# Patient Record
Sex: Male | Born: 1955
Health system: Southern US, Community
[De-identification: ages and names within clinical notes are randomized; demographics above are authoritative.]

## PROBLEM LIST (undated history)

## (undated) DIAGNOSIS — N200 Calculus of kidney: Secondary | ICD-10-CM

## (undated) DIAGNOSIS — K635 Polyp of colon: Secondary | ICD-10-CM

## (undated) DIAGNOSIS — R569 Unspecified convulsions: Secondary | ICD-10-CM

## (undated) DIAGNOSIS — F419 Anxiety disorder, unspecified: Secondary | ICD-10-CM

## (undated) DIAGNOSIS — F329 Major depressive disorder, single episode, unspecified: Secondary | ICD-10-CM

## (undated) DIAGNOSIS — J449 Chronic obstructive pulmonary disease, unspecified: Secondary | ICD-10-CM

## (undated) DIAGNOSIS — I10 Essential (primary) hypertension: Secondary | ICD-10-CM

## (undated) DIAGNOSIS — I639 Cerebral infarction, unspecified: Secondary | ICD-10-CM

## (undated) DIAGNOSIS — G4730 Sleep apnea, unspecified: Secondary | ICD-10-CM

## (undated) DIAGNOSIS — S82891A Other fracture of right lower leg, initial encounter for closed fracture: Secondary | ICD-10-CM

## (undated) DIAGNOSIS — F32A Depression, unspecified: Secondary | ICD-10-CM

## (undated) DIAGNOSIS — Z86718 Personal history of other venous thrombosis and embolism: Secondary | ICD-10-CM

## (undated) DIAGNOSIS — M1990 Unspecified osteoarthritis, unspecified site: Secondary | ICD-10-CM

## (undated) HISTORY — DX: Polyp of colon: K63.5

## (undated) HISTORY — DX: Calculus of kidney: N20.0

## (undated) HISTORY — DX: Chronic obstructive pulmonary disease, unspecified: J44.9

## (undated) HISTORY — DX: Other fracture of right lower leg, initial encounter for closed fracture: S82.891A

---

## 2003-04-17 DIAGNOSIS — I639 Cerebral infarction, unspecified: Secondary | ICD-10-CM

## 2003-04-17 HISTORY — PX: OTHER SURGICAL HISTORY: SHX169

## 2003-04-17 HISTORY — DX: Cerebral infarction, unspecified: I63.9

## 2003-11-12 ENCOUNTER — Inpatient Hospital Stay (HOSPITAL_COMMUNITY): Admission: EM | Admit: 2003-11-12 | Discharge: 2003-11-15 | Payer: Self-pay | Admitting: Emergency Medicine

## 2004-02-02 ENCOUNTER — Emergency Department (HOSPITAL_COMMUNITY): Admission: EM | Admit: 2004-02-02 | Discharge: 2004-02-03 | Payer: Self-pay | Admitting: Emergency Medicine

## 2004-06-08 ENCOUNTER — Ambulatory Visit: Payer: Self-pay | Admitting: Internal Medicine

## 2004-06-08 ENCOUNTER — Ambulatory Visit (HOSPITAL_COMMUNITY): Admission: RE | Admit: 2004-06-08 | Discharge: 2004-06-08 | Payer: Self-pay | Admitting: Internal Medicine

## 2004-06-08 HISTORY — PX: COLONOSCOPY: SHX174

## 2004-12-28 ENCOUNTER — Ambulatory Visit: Payer: Self-pay | Admitting: Neurology

## 2005-04-18 ENCOUNTER — Emergency Department (HOSPITAL_COMMUNITY): Admission: EM | Admit: 2005-04-18 | Discharge: 2005-04-18 | Payer: Self-pay | Admitting: Emergency Medicine

## 2005-05-10 ENCOUNTER — Emergency Department (HOSPITAL_COMMUNITY): Admission: EM | Admit: 2005-05-10 | Discharge: 2005-05-10 | Payer: Self-pay | Admitting: Emergency Medicine

## 2007-09-02 ENCOUNTER — Emergency Department (HOSPITAL_COMMUNITY): Admission: EM | Admit: 2007-09-02 | Discharge: 2007-09-02 | Payer: Self-pay | Admitting: Emergency Medicine

## 2009-05-25 ENCOUNTER — Ambulatory Visit: Payer: Self-pay | Admitting: Gastroenterology

## 2009-05-25 DIAGNOSIS — I6350 Cerebral infarction due to unspecified occlusion or stenosis of unspecified cerebral artery: Secondary | ICD-10-CM | POA: Insufficient documentation

## 2009-05-25 DIAGNOSIS — R197 Diarrhea, unspecified: Secondary | ICD-10-CM | POA: Insufficient documentation

## 2009-05-25 DIAGNOSIS — R569 Unspecified convulsions: Secondary | ICD-10-CM | POA: Insufficient documentation

## 2009-05-25 DIAGNOSIS — L408 Other psoriasis: Secondary | ICD-10-CM | POA: Insufficient documentation

## 2009-05-25 DIAGNOSIS — Z860100 Personal history of colon polyps, unspecified: Secondary | ICD-10-CM | POA: Insufficient documentation

## 2009-05-25 DIAGNOSIS — F3289 Other specified depressive episodes: Secondary | ICD-10-CM | POA: Insufficient documentation

## 2009-05-25 DIAGNOSIS — F329 Major depressive disorder, single episode, unspecified: Secondary | ICD-10-CM | POA: Insufficient documentation

## 2009-05-25 DIAGNOSIS — I10 Essential (primary) hypertension: Secondary | ICD-10-CM | POA: Insufficient documentation

## 2009-06-16 ENCOUNTER — Ambulatory Visit: Payer: Self-pay | Admitting: Gastroenterology

## 2009-06-16 ENCOUNTER — Ambulatory Visit (HOSPITAL_COMMUNITY): Admission: RE | Admit: 2009-06-16 | Discharge: 2009-06-16 | Payer: Self-pay | Admitting: Gastroenterology

## 2009-06-16 HISTORY — PX: COLONOSCOPY: SHX174

## 2009-06-16 HISTORY — PX: ESOPHAGOGASTRODUODENOSCOPY: SHX1529

## 2009-11-18 ENCOUNTER — Emergency Department (HOSPITAL_COMMUNITY): Admission: EM | Admit: 2009-11-18 | Discharge: 2009-11-18 | Payer: Self-pay | Admitting: Emergency Medicine

## 2010-05-18 NOTE — Letter (Signed)
Summary: TCS ORDER  TCS ORDER   Imported By: Ave Filter 05/25/2009 11:03:09  _____________________________________________________________________  External Attachment:    Type:   Image     Comment:   External Document

## 2010-05-18 NOTE — Assessment & Plan Note (Signed)
Summary: CHANGE IN BOWEL HABITS.GU   Visit Type:  Initial Consult Referring Provider:  Dr Lewis Moccasin Primary Care Provider:  Dr Lewis Moccasin  Chief Complaint:  change in bm- diarrhea since christmas.  History of Present Illness: 55 y/o caucasian male w/ 2 month hx of daily chronic dark diarrhea.  Diarrhea in small amts q 2-3 hrs, 5-10 stools/day.  Denies rectal bleeding or melena.  Denies any abd pain.  notes mucous in stool.  Previous c diff, e coli, culture & o/p negative at PCP.  Lost 10# in 2 mo. Denies foreign travel, no new pets, ill contacts, or new meds.  No recent abx.  The patient has associated symptoms of weight loss and dark urine.  The patient denies nausea, vomiting, fever, anxiety, depression, headaches, dry skin, and dry mouth and tongue.  Treatments tried in the past that have been ineffective includes imodium.   Current Problems (verified): 1)  Depression  (ICD-311) 2)  Psoriasis  (ICD-696.1) 3)  Seizure Disorder  (ICD-780.39) 4)  Cva  (ICD-434.91) 5)  Colonic Polyps, Adenomatous, Hx of  (ICD-V12.72) 6)  Hypertension  (ICD-401.9) 7)  Diarrhea  (ICD-787.91) 8)  Fh of Colon Cancer  (ICD-153.9)  Current Medications (verified): 1)  Keppra 500 Mg Tabs (Levetiracetam) .... Four Times A Day Prn 2)  Xanax 1 Mg Tabs (Alprazolam) .... At Bedtime 3)  Flonase .... Once Daily 4)  Abilify 5 Mg Tabs (Aripiprazole) .... 1/2 By Mouth in Am & 1 By Mouth Qhs 5)  Prozac 40 Mg Caps (Fluoxetine Hcl) .... One By Mouth Daily  Allergies (verified): No Known Drug Allergies  Past History:  Past Medical History: COLONIC POLYPS, ADENOMATOUS, HX OF (ICD-V12.72) 3 colonoscopies w/ polyps, last Dr Karilyn Cota 05/2004  SEIZURE DISORDER (ICD-780.39) CVA (ICD-434.91) HYPERTENSION (ICD-401.9) DIARRHEA (ICD-787.91) Psoriasis depression  Past Surgical History: left knee arthrosopy  Family History: Father:(deceased 71) metastatic Ca unknown etiology Mother:(deceased 32) COLON CA dx 66's, CAD Siblings:  1 brother deceased age 6-MI 1 daughter MS  Social History: 3 divorces, single, lives w/son 4 grown children disabled, previously self-employed floor coverings Patient currently smokes. 30 pkyr hx Patient has been counseled to quit.  Alcohol Use - none currently, quit 1 month ago, denies hx heavy use Daily Caffeine Use Illicit Drug Use - no Patient does not get regular exercise.  Smoking Status:  current Drug Use:  no Does Patient Exercise:  no  Review of Systems General:  Complains of sleep disorder; denies fever, chills, sweats, anorexia, fatigue, weakness, and malaise. CV:  Denies chest pains, angina, palpitations, syncope, dyspnea on exertion, orthopnea, PND, peripheral edema, and claudication. Resp:  Denies dyspnea at rest, dyspnea with exercise, cough, sputum, wheezing, coughing up blood, and pleurisy. GI:  Denies difficulty swallowing, pain on swallowing, indigestion/heartburn, vomiting, vomiting blood, jaundice, constipation, and fecal incontinence. GU:  Denies urinary burning, blood in urine, urinary frequency, urinary hesitancy, nocturnal urination, and urinary incontinence. Derm:  Complains of rash and dry skin; denies itching, hives, moles, warts, and unhealing ulcers; psoriasis. Psych:  Denies depression, anxiety, memory loss, suicidal ideation, hallucinations, paranoia, phobia, and confusion. Heme:  Denies bruising, bleeding, and enlarged lymph nodes.  Vital Signs:  Patient profile:   55 year old male Height:      70 inches Weight:      163 pounds BMI:     23.47 Temp:     97.9 degrees F oral Pulse rate:   60 / minute BP sitting:   130 / 92  (left arm) Cuff size:  regular  Vitals Entered By: Hendricks Limes LPN (May 25, 2009 9:32 AM)  Physical Exam  General:  Well developed, well nourished, no acute distress. Head:  Normocephalic and atraumatic. Eyes:  Sclera clear, no icterus. Ears:  Normal auditory acuity. Nose:  No deformity, discharge,  or  lesions. Mouth:  No deformity or lesions, dentition normal. Neck:  Supple; no masses or thyromegaly. Chest Wall:  Symmetrical,  no deformities . Lungs:  Clear throughout to auscultation. Heart:  Regular rate and rhythm; no murmurs, rubs,  or bruits. Abdomen:  Soft, nontender and nondistended. No masses, hepatosplenomegaly or hernias noted. Normal bowel sounds.without guarding and without rebound.   Rectal:  deferred until time of colonoscopy.   Msk:  Symmetrical with no gross deformities. Normal posture. Pulses:  Normal pulses noted. Extremities:  No clubbing, cyanosis, edema or deformities noted. Neurologic:  Alert and  oriented x4;  grossly normal neurologically. Skin:  Intact without significant lesions or rashes. Cervical Nodes:  No significant cervical adenopathy. Psych:  Alert and cooperative. Normal mood and affect.  Impression & Recommendations:  Problem # 1:  DIARRHEA (ICD-54.91) 55 y/o caucasian male w/ 2 month hx chronic daily non-bloody diarrhea.  Personal hx adenomatous and family hx colon CA in mother.  Previous stool studies negative.  He has had weight loss.  Etiology of diarrhea unknown.  Differentials are very wide and include colorectal CA, post-infectious IBS, microscopic colitis, IBD, & bacterial origin less likely given negative stool studies.  Other less likely etiology include thyroid disorder, lymphoma, or least likely VIP-oma.  Diagnostic colonoscopy to be performed by Dr. Jonette Eva in the near future in OR given polypharmacy.  This I have discussed risks and benefits which include, but are not limited to, bleeding, infection, perforation, or medication reaction.  The patient agrees with this plan and consent will be obtained.  Orders: Consultation Level III (16109)  Problem # 2:  COLONIC POLYPS, ADENOMATOUS, HX OF (ICD-V12.72) On 3 previos colonoscopies.  Problem # 3:  Family Hx of COLON CANCER (ICD-153.9) See #1  Appended Document: CHANGE IN BOWEL  HABITS.GU Needs to be TCS/?EGD in OR 1.5 hr slot.  Appended Document: CHANGE IN BOWEL HABITS.GU EGD added to procedure and pt is in an 1.5 hr slot.

## 2010-06-30 LAB — URINALYSIS, ROUTINE W REFLEX MICROSCOPIC
Glucose, UA: NEGATIVE mg/dL
Leukocytes, UA: NEGATIVE
Nitrite: NEGATIVE
Protein, ur: 100 mg/dL — AB
Specific Gravity, Urine: >1.03 — ABNORMAL HIGH (ref 1.005–1.030)
Urobilinogen, UA: 1 mg/dL (ref 0.0–1.0)
pH: 6.5 (ref 5.0–8.0)

## 2010-06-30 LAB — URINE MICROSCOPIC-ADD ON

## 2010-07-05 LAB — HEMOGLOBIN AND HEMATOCRIT, BLOOD
HCT: 43.5 % (ref 39.0–52.0)
Hemoglobin: 15.1 g/dL (ref 13.0–17.0)

## 2010-07-05 LAB — BASIC METABOLIC PANEL
BUN: 7 mg/dL (ref 6–23)
CO2: 25 mEq/L (ref 19–32)
Calcium: 8.8 mg/dL (ref 8.4–10.5)
Chloride: 107 mEq/L (ref 96–112)
Creatinine, Ser: 0.92 mg/dL (ref 0.4–1.5)
GFR calc Af Amer: >60 mL/min (ref >60)
GFR calc non Af Amer: >60 mL/min (ref >60)
Glucose, Bld: 88 mg/dL (ref 70–99)
Potassium: 4.2 mEq/L (ref 3.5–5.1)
Sodium: 140 mEq/L (ref 135–145)

## 2010-09-01 NOTE — Procedures (Signed)
NAMESVEN, PINHEIRO                        ACCOUNT NO.:  [id number]   MEDICAL RECORD NO.:  [id number]                   PATIENT TYPE:  INP   LOCATION:  A211                                 FACILITY:  [facility]   PHYSICIAN:  Dani Gobble, MD                    DATE OF BIRTH:  February 04, 1956   DATE OF PROCEDURE:  11/15/2003  DATE OF DISCHARGE:                                  ECHOCARDIOGRAM   REFERRING PHYSICIAN:  1. Dr. Orvan Falconer.  2. Dr. Laural Benes in [place].   INDICATIONS FOR PROCEDURE:  Mr. Orsak is a 55 year old gentleman with  dizziness and seizures who is referred for echocardiogram to evaluate LV  function.   The technical quality of the study is adequate.   The aorta is within normal limits at 3.4 cm.   The left atrium is also within normal limits at 3.9 cm.  No obvious clots  are appreciated and the patient appeared to be in sinus rhythm during this  procedure.   The interventricular septum and posterior wall are within normal limits and  thickness at 1.1 cm and 0.9 cm, respectively.   The aortic valve is thin, trileaflet, and pliable with normal leaflet  excursion.  No aortic insufficiency is noted.  Doppler interrogation of the  aortic valve is within normal limits.   Mitral valve also appears structurally normal with normal leaflet excursion.  There is a small mobile echodensity just below the mitral valve which is  most consistent with Lambl's excrescence versus a torn minor subvalvular  apparatus, although the possibility of a small vegetation cannot be entirely  excluded.  Trivial mitral regurgitation was noted.  Doppler interrogation of  the mitral valve was within normal limits.   The pulmonic valve appeared grossly structurally normal.   The tricuspid valve also appeared grossly structurally normal with mild  tricuspid regurgitation noted.   The left ventricle was normal in size with the LVEDD measured at 4.9 cm, and  the LVESD measured at 3.5 cm.  Overall  left ventricular systolic function  was normal and no regional wall motion abnormalities were appreciated.  The  right atrium and right ventricle were normal in size.  The right ventricular  systolic function was normal.  No pericardial effusion was appreciated.   IMPRESSION:  1. Normal left ventricular size and systolic function without regional wall     motion abnormalities noted.  2. Trivial mitral and mild tricuspid regurgitation.  3. Small mobile echodensity is noted just below the mitral valve which is     most consistent with a Lambl's excrescence versus a torn minor     subvalvular apparatus, however, the possibility of small vegetation     cannot be entirely excluded.      ___________________________________________  Dani Gobble, MD   AB/MEDQ  D:  11/15/2003  T:  11/15/2003  Job:  [id number]   cc:   Dr. Orvan Falconer   Dr. Philis Nettle, [place]

## 2010-09-01 NOTE — Procedures (Signed)
NAME:  Adam Wheeler, Adam Wheeler                        ACCOUNT NO.:  [id number]   MEDICAL RECORD NO.:  [id number]                   PATIENT TYPE:  INP   LOCATION:  A211                                 FACILITY:  [facility]   PHYSICIAN:  Kofi A. Gerilyn Pilgrim, M.D.              DATE OF BIRTH:  04-30-1955   DATE OF PROCEDURE:  DATE OF DISCHARGE:  11/15/2003                                EEG INTERPRETATION   CONTINUATION:   IMPRESSION:  This is a normal recording of the awake and drowsy states.  However, a single EEG does not rule out epileptic seizures.  If clinically  indicated, a sleep-deprived recording may be useful.      ___________________________________________                                            Darleen Crocker A. Gerilyn Pilgrim, M.D.   KAD/MEDQ  D:  11/17/2003  T:  11/17/2003  Job:  [id number]

## 2010-09-01 NOTE — Op Note (Signed)
NAMEMYKING, SAR              ACCOUNT NO.:  [id number]   MEDICAL RECORD NO.:  [id number]          PATIENT TYPE:  AMB   LOCATION:  DAY                           FACILITY:  [facility]   PHYSICIAN:  Lionel December, M.D.    DATE OF BIRTH:  03/13/1956   DATE OF PROCEDURE:  06/08/2004  DATE OF DISCHARGE:                                 OPERATIVE REPORT   PROCEDURE:  Total colonoscopy with polypectomy.   INDICATIONS:  Adam Wheeler is a 55 year old Caucasian male with history of colonic  polyps. Family history is positive for colon carcinoma in two maternal  uncles and he recently loss a first cousin because of colon carcinoma.  Family history is positive for other cancers, but no first degree relative  has had colon cancer. He had colonic polyps removed about 8 or 10 years ago.  His last colonoscopy in 2001 was normal, though. He has recently had  unexplained weight loss.   Procedure risks were reviewed the patient, and informed consent was  obtained.   PREMEDICATION:  Demerol 75 mg IV, Versed 17 mg IV.   FINDINGS:  Procedure performed in endoscopy suite. The patient's vital signs  and O2 sat were monitored during procedure and remained stable. Despite  fairly high dose of conscious sedation, he was still constantly talking  during the procedure but was very comfortable. The patient was placed in  left lateral position and rectal examination performed. No abnormality noted  on external or digital exam. He had tiny sentinel skin tags. He had  extensive scaly rash to his sacral area secondary to psoriasis. Digital exam  was normal. Olympus video scope was placed in the rectum and advanced under  vision into sigmoid colon beyond. Preparation was satisfactory. Scope was  passed into cecum which was identified by appendiceal orifice and ileocecal  valve. Short segment of TI was also examined and was normal. As the scope  was withdrawn, colonic mucosa was carefully examined. There was a single  polyp  at mid transverse colon about 4 mm in diameter. This was cold snared  and retrieved for histological examination. Mucosa rest of the colon and  rectum was normal. Scope was retroflexed to examine anorectal junction, and  small hemorrhoids were noted below the dentate line. Endoscope was then  withdrawn. The patient tolerated the procedure well.   FINAL DIAGNOSES:  1.  Small polyp cold snared from transverse colon.  2.  Small external hemorrhoids.   RECOMMENDATIONS:  1.  Standard instructions given. He can resume his ASA today.  2.  I will be contacting the patient with biopsy results and further      recommendations.      NR/MEDQ  D:  06/08/2004  T:  06/08/2004  Job:  [id number]   cc:   Bethena Midget, M.D.  [place], [place]

## 2010-09-01 NOTE — Procedures (Signed)
NAME:  Adam Wheeler, Adam Wheeler                        ACCOUNT NO.:  [id number]   MEDICAL RECORD NO.:  [id number]                   PATIENT TYPE:  INP   LOCATION:  A211                                 FACILITY:  [facility]   PHYSICIAN:  Kofi A. Gerilyn Pilgrim, M.D.              DATE OF BIRTH:  1955/10/02   DATE OF PROCEDURE:  DATE OF DISCHARGE:  11/15/2003                                EEG INTERPRETATION   This is a 55 year old gentleman who has seizures.   ANALYSIS:  A 16-channel recording is conducted for approximately 40 minutes  using a posterior rhythm of 10 Hz, which attenuates with eye opening.  There  is beta activity seen in the frontal area.  Awake and drowsy activity are  noted.  There are brief states of sleep noted with some spindles and K  complexes.  Foot stimulation and hyperventilation do not elicit any abnormal  responses.  There is no focal slowing, lateralized slowing or epileptiform  activity noted.   IMPRESSION:  This is a normal recording of awake and drowsy states.   Dictation ended at this point.      ___________________________________________                                            Perlie Gold. Gerilyn Pilgrim, M.D.   KAD/MEDQ  D:  11/17/2003  T:  11/17/2003  Job:  [id number]

## 2010-09-01 NOTE — Discharge Summary (Signed)
NAME:  Adam Wheeler, Adam Wheeler                        ACCOUNT NO.:  [id number]   MEDICAL RECORD NO.:  [id number]                   PATIENT TYPE:  INP   LOCATION:  A211                                 FACILITY:  [facility]   PHYSICIAN:  Vania Rea, M.D.              DATE OF BIRTH:  27-Nov-1955   DATE OF ADMISSION:  11/12/2003  DATE OF DISCHARGE:  11/15/2003                                 DISCHARGE SUMMARY   PRIMARY CARE PHYSICIAN:  Dr. Laural Benes from [facility].   DISCHARGE DIAGNOSES:  1. New onset seizures.  2. Right hemiplegia, resolved.  3. Labyrinthitis.  4. Active psoriasis.  5. Tobacco abuse.  6. History of depression.  7. History of gastroesophageal reflux disease.  8. History of left knee surgery five weeks ago.   DISPOSITION:  Discharged to home.   DISCHARGE CONDITION:  Stable.   DISCHARGE MEDICATIONS:  1. Dilantin 200 mg daily.  2. Augmentin 875 mg twice daily for one week.  3. Zyrtec 10 mg daily.  4. Meclizine 25 mg three times a day for one week.  5. Aspirin 325 mg daily.  6. Prevacid 30 mg daily.  7. Nicotine patch 40 mg per day.  8. Ambien 10 mg at bedtime when necessary.   HOSPITAL COURSE:  Please refer to History and Physical of July 29th.  This  is a 55 year old Caucasian man, started on Wellbutrin about five weeks ago.  Also, has a history of depression, tobacco abuse, GERD, psoriasis, status  post left knee surgery, also five weeks ago, presented to the emergency room  after having a witnessed generalized seizure while sitting in his car.  Postictal phase was described and in the emergency room, the patient was  noted to have mild confusion, but not sufficient to prevent taking a clear  history.  The patient was also found to have a mild right hemiplegia with  power grade 5 on the right, although the patient is right-handed.  The  patient was loaded with Dilantin and admitted for investigation over this  weekend.  The patient had an MRI and MRA  of the brain which was completely  normal with no evidence of tumor, ischemic deficits or vascular  abnormalities.   By the following day, the patients right hemiplegia had almost resolved, but  the patient did not seem quite himself.  The patient also started at that  time to complain of vertigo with extension of his neck.  The patient  described even with his eyes closed, the room started to spin when he  extended his neck right back.  Carotid sinus massage during this  hospitalization caused no change in his pulse and no abnormalities were  noted on the telemetry monitor.   Today, the patient had completed an EEG, 2D echo and Dopplers of both  carotids.  The results of these three studies are not yet available, but  since the patient is normal on  physical exam, he has no known neurologic  deficit, he has no carotid bruit and never did.  His heart rhythm has been  in normal sinus rhythm throughout his hospital stay, without any cardiac  murmurs.  His D-dimer was negative, therefore, we felt there was no  indication for pursuing the diagnosis of pulmonary embolism.  He was never  hypoxic and was never tachycardic.  The patient is discharged home, to be  followed by Kofi A. Gerilyn Pilgrim, M.D., Neurologist, and his primary care  physician.  Due to personal emergencies, Dr. Gerilyn Pilgrim is not able to see the  patient on this admission.   We note that the patient has been on Wellbutrin for five weeks, but we are  unable to say definitively if this could be the cause of his seizures.   SPECIAL INSTRUCTIONS:  The patient is advised not to drive until cleared to  do so by the neurologist and if that time does not come, then he should not  drive for one year.  The patient is advised that he will probably need to  substitute another drug for Wellbutrin.  He actually got one dose of Lexapro  when here, but he prefers to have his primary care physician select a drug  for him.  The patient has been  continued on a nicotine patch for his tobacco  abuse as he would like to stop.  The patient has been advised of the side  effects of Dilantin to cause gum hypertrophy when oral hygiene is not  optimal.     ___________________________________________                                         Vania Rea, M.D.   LC/MEDQ  D:  11/15/2003  T:  11/15/2003  Job:  [id number]   cc:   Kofi A. Gerilyn Pilgrim, M.D.  [id number] [place] [place]  [place]  [place] [address]  Fax: [phone]   Dr. [facility]

## 2010-09-01 NOTE — H&P (Signed)
NAME:  Adam [place], Adam Wheeler                        ACCOUNT NO.:  [id number]   MEDICAL RECORD NO.:  [id number]                   PATIENT TYPE:  EMS   LOCATION:  ED                                   FACILITY:  [facility]   PHYSICIAN:  Vania Rea, M.D.              DATE OF BIRTH:  28-Dec-1955   DATE OF ADMISSION:  11/12/2003  DATE OF DISCHARGE:                                HISTORY & PHYSICAL   PRIMARY CARE PHYSICIAN:  Dr. Laural Benes from [facility].   CHIEF COMPLAINT:  New-onset seizures.   HISTORY OF PRESENT ILLNESS:  This is a 55 year old Caucasian man with a  history of GERD, tobacco abuse, psoriasis, depression, started on Wellbutrin  about 5 weeks ago, status post left knee surgery about 5 weeks ago.  Has  been under a lot of stress lately and has not been sleeping well.  Has been  having headaches on and off.  His brother is sick and has leukemia, his  mother is not well, he is moving house, but medically was otherwise okay  until sitting in a car at [facility] this morning, was  about to drive off, when apparently suddenly started shaking and lost  awareness.  The patient has no memory of the incident, last remembers  sitting in the car, but occupants of the car apparently recognized him as  having seizures and drove the car up to the emergency response depot which  was nearby.  The patient has a vague awareness of being in an ambulance and  was brought to the emergency room.   A CT scan of the head showed no acute abnormalities and his D-dimer is  normal.   The patient is currently having headaches but has no other problems.  Smokes  about a half pack per day and has a chronic cough, has not been told that he  has wheezing or bronchitis.  Says his exercise tolerance is normal but does  not do much climbing on the stairs because of his knee.  Has no previous  history of syncope or strokes.  Has never seen a psychiatrist.   PAST MEDICAL HISTORY:  1. Status post left knee surgery 5 weeks ago because of injury to his knee     from his occupation involving a lot of kneeling.  2. Hiatal hernia with GERD.  3. Psoriasis.  4. Depression.  5. Chronic tobacco abuse.   MEDICATIONS:  1. Prevacid 30 mg daily.  2. Wellbutrin XL unknown dose daily.   ALLERGIES:  No known drug allergies.   SOCIAL HISTORY:  He is divorced about one-and-a-half years now after 12  years in his third marriage.  Has four children.  Smokes for the past 30  years, used to be one pack per day until the last 6 months it is about a  half pack per day.  He is trying to cut down and that is one  of the reasons  he is taking Wellbutrin.  Very occasional alcohol use.  Denies illicit drug  use but he was questioned in front of his family.  For the past 6 months he  has been working as an Scientific laboratory technician - [employer] - but because  of problems with his knee he has discontinued that.  He is also a musician  in a band, a Proofreader.   FAMILY HISTORY:  Significant for a mother who is still alive at age 55 with  hypertension and heart disease, a father who is deceased at age 84 from  stomach cancer 6 years ago.  His only brother has leukemia and, in fact, is  being hospitalized today for what sounds like I&D of an abscess by Dr.  Malvin Johns.  He has four children - the three boys are in good health, the one  girl is with multiple sclerosis.   REVIEW OF SYSTEMS:  Headache as outlined above.  No eye problems or thyroid  problems. He has a chronic smokers cough.  He has had insomnia since about  Wednesday and that is for the past 3 days.  Denies any fever, cough, or  cold.  Denies chest pains or shortness of breath.  He does have epigastric  pains associated with his GERD and has to sleep upright, sometimes wakes up  with epigastric pains which he presumes is due to GERD.  Denies nausea,  vomiting, diarrhea, constipation, or weight changes.  Denies any  genitourinary  problems.  Has joint problems with both knees status post left  knee surgery.  Scheduled to have right knee surgery on August 9.  He says it  is an orthopedic group in [place].  Has never had any central nervous  system problems before.   PHYSICAL EXAMINATION:  GENERAL:  This is a healthy-looking, middle-aged  Caucasian man lying in bed in no obvious distress although he seems to be  very hungry.  VITAL SIGNS:  His temperature is 97.3, blood pressure 154/91, pulse 72,  saturating at 98% on room air.  HEENT:  His pupils are round, equal, and reactive to light.  He is pink and  anicteric, mildly dehydrated.  CHEST:  He has diffuse rhonchi throughout his chest, no clear crackles.  CARDIOVASCULAR:  He has a regular rhythm, no carotid bruit.  ABDOMEN:  Soft and nontender.  EXTREMITIES:  Without edema.  There are no joint deformities.  SKIN:  He has extensive psoriasis over both knees, extends the surface of  his hands.  He has nail changes in all 10 upper extremity digits compatible  with psoriasis.  He also has psoriasis of the scalp.  NEUROLOGIC:  Cranial nerves II-X seem to be grossly intact.  Sensation seems  to be grossly intact.  Deep tendon reflexes seem equal and normal  throughout.  Left knee jerk was not tested because of his recent surgery.  However, power was grade 4 on the right upper and lower extremity and grade  5 on the left.  The patient is right-handed.  His gait is impaired due to  right-sided weakness.   LABORATORY DATA:  His CBC is entirely normal with a white count of 6.8 and a  hematocrit of 38.8.  His chemistry is essentially normal:  Sodium 139,  potassium 3.9, chloride 107, CO2 25, glucose 101, BUN 11, creatinine 1.1,  calcium 8.7, total protein 6.1, albumin 3.7, AST 18, ALT 18, alk phos 49,  total bilirubin 0.6.  His D-dimer is  normal at 0.25.  His point of care enzymes are also normal.  CT scan of the head shows no acute abnormality.  Chest x-ray shows  hyperinflation and COPD.  EKG shows normal sinus rhythm.   1. New-onset seizure/syncope with a right hemiplegia and a normal CT scan.  2. Chronic obstructive pulmonary disease and bronchiospastic disease on     clinical examination in a chronic smoker.  3. History of gastroesophageal reflux disease.  4. History of depression.  5. Status post left knee surgery.   PLAN:  Plan for his seizures:  We cannot assume that it is simply a result  of the Wellbutrin because he does have the right hemiplegia.  We will go  ahead and get an MRI/MRA of the brain to rule out some type of tumor or some  type of neurologic deficit.  Could also be a TIA if we find that the deficit  recovers and the MRI is normal.  Could conceivably be due to hypoxia.  He  seems to have significant lung disease on examination and chest x-ray but he  is currently saturating okay on room air.  Again, he does have a right  hemiplegia. His GERD seems to be quite significant by history and we will  give him double coverage of Protonix and Reglan since we will be starting  him on aspirin since he has no signs of bleed and he does have a neurologic  deficit.  His blood pressure seems to be somewhat elevated and we will keep  a check on it for the next 24 hours before considering starting any new  medications.  We will load him with Dilantin since he has a neurologic  deficit and then we will continue with Dilantin pending the results of the  MRI/MRA.  He will get as complete a neurological evaluation as we can  pending his neurology consult.  We will withhold the Wellbutrin and start  him on a nicotine patch for his tobacco abuse.   If his hemiplegia resolves and his MRI/MRA is completely normal, this  patient may possibly discharged home to be followed up by Dr. Gerilyn Pilgrim as an  outpatient.  The patient is very, very reluctant to be admitted but we feel  it is the best thing for him.      ___________________________________________                                         Vania Rea, M.D.   LC/MEDQ  D:  11/12/2003  T:  11/12/2003  Job:  [id number]

## 2012-02-17 ENCOUNTER — Emergency Department (HOSPITAL_COMMUNITY)
Admission: EM | Admit: 2012-02-17 | Discharge: 2012-02-17 | Disposition: A | Discharge status: Home / Self Care | Payer: Medicare Other | Attending: Emergency Medicine | Admitting: Emergency Medicine

## 2012-02-17 ENCOUNTER — Emergency Department (HOSPITAL_COMMUNITY): Payer: Medicare Other

## 2012-02-17 ENCOUNTER — Encounter (HOSPITAL_COMMUNITY): Payer: Self-pay | Admitting: Emergency Medicine

## 2012-02-17 DIAGNOSIS — S59909A Unspecified injury of unspecified elbow, initial encounter: Secondary | ICD-10-CM | POA: Insufficient documentation

## 2012-02-17 DIAGNOSIS — M25539 Pain in unspecified wrist: Secondary | ICD-10-CM

## 2012-02-17 DIAGNOSIS — Y939 Activity, unspecified: Secondary | ICD-10-CM | POA: Insufficient documentation

## 2012-02-17 DIAGNOSIS — Y929 Unspecified place or not applicable: Secondary | ICD-10-CM | POA: Insufficient documentation

## 2012-02-17 DIAGNOSIS — W01119A Fall on same level from slipping, tripping and stumbling with subsequent striking against unspecified sharp object, initial encounter: Secondary | ICD-10-CM | POA: Insufficient documentation

## 2012-02-17 DIAGNOSIS — S6990XA Unspecified injury of unspecified wrist, hand and finger(s), initial encounter: Secondary | ICD-10-CM | POA: Insufficient documentation

## 2012-02-17 MED ORDER — OXYCODONE-ACETAMINOPHEN 5-325 MG PO TABS
1.0000 | ORAL_TABLET | Freq: Once | ORAL | Status: AC
  Administered 2012-02-17: 1 via ORAL
  Filled 2012-02-17: qty 1

## 2012-02-17 MED ORDER — OXYCODONE-ACETAMINOPHEN 5-325 MG PO TABS: 1.0000 | ORAL_TABLET | ORAL | Status: AC | PRN

## 2012-02-17 MED ORDER — NAPROXEN 500 MG PO TABS: 500.0000 mg | ORAL_TABLET | Freq: Two times a day (BID) | ORAL | Status: DC

## 2012-02-17 NOTE — ED Provider Notes (Signed)
History     CSN: [id number]  Arrival date & time 02/17/12  1122   First MD Initiated Contact with Patient 02/17/12 1152      Chief Complaint  Patient presents with  . Wrist Injury    (Consider location/radiation/quality/duration/timing/severity/associated sxs/prior treatment) HPI Comments: Patient c/o pain and swelling to his left wrist.  Pain began after he tripped and fell on an outstretched hand.  He denies proximal tenderness, numbness or weakness of the left hand or arm, or chest pain.  He states he has not tried to apply ice packs.     Patient is a 56 y.o. male presenting with wrist pain. The history is provided by the patient.  Wrist Pain This is a new problem. Episode onset: on the evening PTA. The problem occurs constantly. The problem has been unchanged. Associated symptoms include arthralgias and joint swelling. Pertinent negatives include no chest pain, chills, diaphoresis, fever, headaches, nausea, neck pain, numbness, rash, sore throat, vomiting or weakness. The symptoms are aggravated by bending (movement of the wrist and plaption). He has tried nothing for the symptoms. The treatment provided no relief.    History reviewed. No pertinent past medical history.  History reviewed. No pertinent past surgical history.  History reviewed. No pertinent family history.  History  Substance Use Topics  . Smoking status: Not on file  . Smokeless tobacco: Not on file  . Alcohol Use: Not on file      Review of Systems  Constitutional: Negative for fever, chills and diaphoresis.  HENT: Negative for sore throat and neck pain.   Cardiovascular: Negative for chest pain.  Gastrointestinal: Negative for nausea and vomiting.  Genitourinary: Negative for dysuria and difficulty urinating.  Musculoskeletal: Positive for joint swelling and arthralgias.  Skin: Negative for color change, rash and wound.  Neurological: Negative for weakness, numbness and headaches.  All other systems  reviewed and are negative.    Allergies  Review of patient's allergies indicates not on file.  Home Medications  No current outpatient prescriptions on file.  BP 143/88  Pulse 61  Temp 98 F (36.7 C) (Oral)  Resp 20  SpO2 96%  Physical Exam  Nursing note and vitals reviewed. Constitutional: He is oriented to person, place, and time. He appears well-developed and well-nourished. No distress.  HENT:  Head: Normocephalic and atraumatic.  Cardiovascular: Normal rate, regular rhythm and normal heart sounds.   Pulmonary/Chest: Effort normal and breath sounds normal.  Musculoskeletal: He exhibits tenderness. He exhibits no edema.       Left wrist: He exhibits decreased range of motion, tenderness, bony tenderness and swelling. He exhibits no crepitus, no deformity and no laceration.       left wrist is ttp at the medial aspect of the wrist and snuffbox.  Radial pulse is brisk, sensation intact.  CR< 2 sec.  Moderate STS.  No bruising or bony deformity.    Neurological: He is alert and oriented to person, place, and time. He has normal strength. No cranial nerve deficit or sensory deficit. He exhibits normal muscle tone. Coordination normal.  Skin: Skin is warm and dry.    ED Course  Procedures (including critical care time)  Labs Reviewed - No data to display Dg Wrist Complete Right  02/17/2012  *RADIOLOGY REPORT*  Clinical Data:  wrist injury  RIGHT WRIST - COMPLETE 3+ VIEW  Comparison: None.  Findings: There is no evidence of fracture or dislocation.  There is no evidence of arthropathy or other focal bone abnormality.  Soft tissues are unremarkable.  IMPRESSION: Negative   Original Report Authenticated By: Signa Kell, M.D.      velcro wrist splint applied, pain improved.  Remains NV intact   MDM     ttp of the medial right wrist and anatomical snuffbox.  i have advised pt of importance of close f/u with orthopedics and possible occult scaphoid fx vs ligamentous injury.   Given referral to Dr. Hilda Lias.  He verbalized understanding and agrees to care plan.     Prescribed Percocet #[id number] naprosyn  Odyssey Vasbinder L. [place], [place] 02/18/12 2124

## 2012-02-17 NOTE — Discharge Instructions (Signed)
Wrist Pain  Wrist injuries are frequent in adults and children. A sprain is an injury to the ligaments that hold your bones together. A strain is an injury to muscle or muscle cord-like structures (tendons) from stretching or pulling. Generally, when wrists are moderately tender to touch following a fall or injury, a break in the bone (fracture) may be present. Most wrist sprains or strains are better in 3 to 5 days, but complete healing may take several weeks.  HOME CARE INSTRUCTIONS    Put ice on the injured area.   Put ice in a plastic bag.   Place a towel between your skin and the bag.   Leave the ice on for 15 to 20 minutes, 3 to 4 times a day, for the first 2 days.   Keep your arm raised above the level of your heart whenever possible to reduce swelling and pain.   Rest the injured area for at least 48 hours or as directed by your caregiver.   If a splint or elastic bandage has been applied, use it for as long as directed by your caregiver or until seen by a caregiver for a follow-up exam.   Only take over-the-counter or prescription medicines for pain, discomfort, or fever as directed by your caregiver.   Keep all follow-up appointments. You may need to follow up with a specialist or have follow-up X-rays. Improvement in pain level is not a guarantee that you did not fracture a bone in your wrist. The only way to determine whether or not you have a broken bone is by X-ray.  SEEK IMMEDIATE MEDICAL CARE IF:    Your fingers are swollen, very red, white, or cold and blue.   Your fingers are numb or tingling.   You have increasing pain.   You have difficulty moving your fingers.  MAKE SURE YOU:    Understand these instructions.   Will watch your condition.   Will get help right away if you are not doing well or get worse.  Document Released: 01/10/2005 Document Revised: 06/25/2011 Document Reviewed: 05/24/2010  [employer] Patient Information 2013 [employer].

## 2012-02-17 NOTE — ED Notes (Signed)
Pt c/o right wrist pain after he fell onto wrist last night. Pt states he was walking in yard and tripped over something and landed on wrist. Pt presents with swelling and limited ROM to wrist.

## 2012-02-19 NOTE — ED Provider Notes (Signed)
Medical screening examination/treatment/procedure(s) were performed by non-physician practitioner and as supervising physician I was immediately available for consultation/collaboration.   Aevah Stansbery III, MD 02/19/12 2139 

## 2012-03-07 ENCOUNTER — Other Ambulatory Visit (HOSPITAL_COMMUNITY): Payer: Self-pay | Admitting: Orthopaedic Surgery

## 2012-03-07 DIAGNOSIS — M25539 Pain in unspecified wrist: Secondary | ICD-10-CM

## 2012-03-10 ENCOUNTER — Ambulatory Visit (HOSPITAL_COMMUNITY)
Admission: RE | Admit: 2012-03-10 | Discharge: 2012-03-10 | Disposition: A | Discharge status: Home / Self Care | Payer: Medicare Other | Source: Ambulatory Visit | Attending: Orthopaedic Surgery | Admitting: Orthopaedic Surgery

## 2012-03-10 DIAGNOSIS — M25539 Pain in unspecified wrist: Secondary | ICD-10-CM

## 2012-03-10 DIAGNOSIS — X58XXXA Exposure to other specified factors, initial encounter: Secondary | ICD-10-CM | POA: Insufficient documentation

## 2012-03-10 DIAGNOSIS — S52599A Other fractures of lower end of unspecified radius, initial encounter for closed fracture: Secondary | ICD-10-CM | POA: Insufficient documentation

## 2012-06-14 DIAGNOSIS — S82891A Other fracture of right lower leg, initial encounter for closed fracture: Secondary | ICD-10-CM

## 2012-06-14 HISTORY — DX: Other fracture of right lower leg, initial encounter for closed fracture: S82.891A

## 2012-07-01 ENCOUNTER — Emergency Department (HOSPITAL_COMMUNITY): Payer: Medicare Other

## 2012-07-01 ENCOUNTER — Encounter (HOSPITAL_COMMUNITY): Payer: Self-pay | Admitting: Emergency Medicine

## 2012-07-01 ENCOUNTER — Emergency Department (HOSPITAL_COMMUNITY)
Admission: EM | Admit: 2012-07-01 | Discharge: 2012-07-01 | Disposition: A | Discharge status: Home / Self Care | Payer: Medicare Other | Attending: Emergency Medicine | Admitting: Emergency Medicine

## 2012-07-01 DIAGNOSIS — Z859 Personal history of malignant neoplasm, unspecified: Secondary | ICD-10-CM | POA: Insufficient documentation

## 2012-07-01 DIAGNOSIS — W010XXA Fall on same level from slipping, tripping and stumbling without subsequent striking against object, initial encounter: Secondary | ICD-10-CM | POA: Insufficient documentation

## 2012-07-01 DIAGNOSIS — S93401A Sprain of unspecified ligament of right ankle, initial encounter: Secondary | ICD-10-CM

## 2012-07-01 DIAGNOSIS — Y9289 Other specified places as the place of occurrence of the external cause: Secondary | ICD-10-CM | POA: Insufficient documentation

## 2012-07-01 DIAGNOSIS — F17200 Nicotine dependence, unspecified, uncomplicated: Secondary | ICD-10-CM | POA: Insufficient documentation

## 2012-07-01 DIAGNOSIS — S6990XA Unspecified injury of unspecified wrist, hand and finger(s), initial encounter: Secondary | ICD-10-CM | POA: Insufficient documentation

## 2012-07-01 DIAGNOSIS — S93409A Sprain of unspecified ligament of unspecified ankle, initial encounter: Secondary | ICD-10-CM | POA: Insufficient documentation

## 2012-07-01 DIAGNOSIS — Y9389 Activity, other specified: Secondary | ICD-10-CM | POA: Insufficient documentation

## 2012-07-01 DIAGNOSIS — Z79899 Other long term (current) drug therapy: Secondary | ICD-10-CM | POA: Insufficient documentation

## 2012-07-01 DIAGNOSIS — IMO0002 Reserved for concepts with insufficient information to code with codable children: Secondary | ICD-10-CM | POA: Insufficient documentation

## 2012-07-01 DIAGNOSIS — F411 Generalized anxiety disorder: Secondary | ICD-10-CM | POA: Insufficient documentation

## 2012-07-01 DIAGNOSIS — M79641 Pain in right hand: Secondary | ICD-10-CM

## 2012-07-01 HISTORY — DX: Cerebral infarction, unspecified: I63.9

## 2012-07-01 HISTORY — DX: Anxiety disorder, unspecified: F41.9

## 2012-07-01 MED ORDER — HYDROMORPHONE HCL PF 1 MG/ML IJ SOLN
1.0000 mg | Freq: Once | INTRAMUSCULAR | Status: AC
  Administered 2012-07-01: 1 mg via INTRAMUSCULAR
  Filled 2012-07-01: qty 1

## 2012-07-01 MED ORDER — OXYCODONE-ACETAMINOPHEN 7.5-325 MG PO TABS: 1.0000 | ORAL_TABLET | ORAL | Status: DC | PRN

## 2012-07-01 MED ORDER — IBUPROFEN 600 MG PO TABS: 600.0000 mg | ORAL_TABLET | Freq: Four times a day (QID) | ORAL | Status: DC | PRN

## 2012-07-01 MED ORDER — OXYCODONE-ACETAMINOPHEN 5-325 MG PO TABS
1.0000 | ORAL_TABLET | Freq: Once | ORAL | Status: AC
  Administered 2012-07-01: 1 via ORAL
  Filled 2012-07-01: qty 1

## 2012-07-01 NOTE — Discharge Instructions (Signed)
Ankle Sprain  An ankle sprain is an injury to the strong, fibrous tissues (ligaments) that hold the bones of your ankle joint together.   CAUSES  An ankle sprain is usually caused by a fall or by twisting your ankle. Ankle sprains most commonly occur when you step on the outer edge of your foot, and your ankle turns inward. People who participate in sports are more prone to these types of injuries.   SYMPTOMS    Pain in your ankle. The pain may be present at rest or only when you are trying to stand or walk.   Swelling.   Bruising. Bruising may develop immediately or within 1 to 2 days after your injury.   Difficulty standing or walking, particularly when turning corners or changing directions.  DIAGNOSIS   Your caregiver will ask you details about your injury and perform a physical exam of your ankle to determine if you have an ankle sprain. During the physical exam, your caregiver will press on and apply pressure to specific areas of your foot and ankle. Your caregiver will try to move your ankle in certain ways. An X-ray exam may be done to be sure a bone was not broken or a ligament did not separate from one of the bones in your ankle (avulsion fracture).   TREATMENT   Certain types of braces can help stabilize your ankle. Your caregiver can make a recommendation for this. Your caregiver may recommend the use of medicine for pain. If your sprain is severe, your caregiver may refer you to a surgeon who helps to restore function to parts of your skeletal system (orthopedist) or a physical therapist.  HOME CARE INSTRUCTIONS    Apply ice to your injury for 1 to 2 days or as directed by your caregiver. Applying ice helps to reduce inflammation and pain.   Put ice in a plastic bag.   Place a towel between your skin and the bag.   Leave the ice on for 15 to 20 minutes at a time, every 2 hours while you are awake.   Only take over-the-counter or prescription medicines for pain, discomfort, or fever as directed  by your caregiver.   Keep your injured leg elevated, when possible, to lessen swelling.   If your caregiver recommends crutches, use them as instructed. Gradually put weight on the affected ankle. Continue to use crutches or a cane until you can walk without feeling pain in your ankle.   If you have a plaster splint, wear the splint as directed by your caregiver. Do not rest it on anything harder than a pillow for the first 24 hours. Do not put weight on it. Do not get it wet. You may take it off to take a shower or bath.   You may have been given an elastic bandage to wear around your ankle to provide support. If the elastic bandage is too tight (you have numbness or tingling in your foot or your foot becomes cold and blue), adjust the bandage to make it comfortable.   If you have an air splint, you may blow more air into it or let air out to make it more comfortable. You may take your splint off at night and before taking a shower or bath.   Wiggle your toes in the splint several times per day to decrease swelling.  SEEK MEDICAL CARE IF:    You have an increase in bruising, swelling, or pain.   Your toes feel extremely cold   You have severe pain. MAKE SURE YOU:   Understand these instructions.  Will watch your condition.  Will get help right away if you are not doing well or get worse. Document Released: 04/02/2005 Document Revised: 06/25/2011 Document Reviewed: 04/14/2011 [facility] Patient Information 2013 [place], [place].   Wear the ASO and use your walker to avoid weight bearing.  Use ice and elevation as much as possible for the next several days to help reduce the swelling.  Take the medications prescribed.  You may take the oxycodone prescribed for pain relief.  This will make you drowsy - do not drive within 4 hours of taking this medication.  Use  the ibuprofen also for inflammation.  Call  Dr. Hilda Lias for a recheck of your injuries.  As discussed,  You may need further evaluation of your hand injury such as a CT scan if your pain persists.  Please discuss this with Dr. Hilda Lias at your visit for your ankle injury.  In the meantime,  Wear your wrist splint that you already have.  Return here for any problems or concerns in the interim.

## 2012-07-01 NOTE — ED Provider Notes (Signed)
History     CSN: [id number]  Arrival date & time 07/01/12  1352   First MD Initiated Contact with Patient 07/01/12 1507      Chief Complaint  Patient presents with  . Fall  . Ankle Pain  . Back Pain  . Hand Pain    (Consider location/radiation/quality/duration/timing/severity/associated sxs/prior treatment) HPI Comments: Adam [place] is a 57 y.o. Male presenting with aching  pain and swelling of his right lateral ankle,  Right hand and pain in his lower back since slipping on ice as his dog was pulling him down his front steps.  He landed on cement on his lower back but somehow also twisted his ankle during the fall.  He denies numbness in his distal foot and toes.  He has developed a small tender nodule at the base of his right 4th finger, volar surface since the fall.  He did not hit his head,  Had no loc and denies any other injury. He has not taken any pain medicine since the fall.  Weight bearing and palpation of the ankle makes it worse.  Pain in his lower back is tender,  Worse with certain positions.  He denies numbness or weakness in his lower extremities.     The history is provided by the patient.    Past Medical History  Diagnosis Date  . Anxiety   . Cancer     History reviewed. No pertinent past surgical history.  Family History  Problem Relation Age of Onset  . Cancer Father     History  Substance Use Topics  . Smoking status: Current Every Day Smoker -- 0.03 packs/day for 40 years    Types: Cigarettes  . Smokeless tobacco: Never Used  . Alcohol Use: No      Review of Systems  Constitutional: Negative for fever.  Respiratory: Negative for shortness of breath.   Cardiovascular: Negative for chest pain and leg swelling.  Gastrointestinal: Negative for abdominal pain, constipation and abdominal distention.  Genitourinary: Negative for dysuria, urgency, frequency, flank pain and difficulty urinating.  Musculoskeletal: Positive for back pain, joint  swelling, arthralgias and gait problem.  Skin: Negative for rash and wound.  Neurological: Negative for weakness and numbness.    Allergies  Review of patient's allergies indicates no known allergies.  Home Medications   Current Outpatient Rx  Name  Route  Sig  Dispense  Refill  . ALPRAZolam (XANAX) 1 MG tablet   Oral   Take 1 mg by mouth 3 (three) times daily as needed for sleep or anxiety.         Marland Kitchen FLUoxetine (PROZAC) 40 MG capsule   Oral   Take 40 mg by mouth every morning.           Pulse 60  Temp(Src) 97.8 F (36.6 C) (Oral)  Resp 16  Ht 5\' 10"  (1.778 m)  Wt 166 lb (75.297 kg)  BMI 23.82 kg/m2  SpO2 98%  Physical Exam  Nursing note and vitals reviewed. Constitutional: He appears well-developed and well-nourished.  HENT:  Head: Normocephalic.  Eyes: Conjunctivae are normal.  Neck: Normal range of motion. Neck supple.  Cardiovascular: Normal rate and intact distal pulses.  Exam reveals no decreased pulses.   Pulses:      Dorsalis pedis pulses are 2+ on the right side, and 2+ on the left side.       Posterior tibial pulses are 2+ on the right side, and 2+ on the left side.  Pedal pulses  normal.  Pulmonary/Chest: Effort normal.  Abdominal: Soft. Bowel sounds are normal. He exhibits no distension and no mass.  Musculoskeletal: He exhibits edema and tenderness.       Right ankle: He exhibits decreased range of motion, swelling and ecchymosis. He exhibits normal pulse. Tenderness. Lateral malleolus tenderness found. No head of 5th metatarsal and no proximal fibula tenderness found. Achilles tendon normal.       Lumbar back: He exhibits tenderness. He exhibits no swelling, no edema and no spasm.  Point tender right volar hand at base of 4th finger with raised nodule at this site.  Pt has FROM of digits with no crepitus or locking, with with pain at the nodule site.  Less than 3 sec cap refill.   Neurological: He is alert. He has normal strength. He displays no  atrophy and no tremor. No sensory deficit. Gait normal.  Reflex Scores:      Patellar reflexes are 2+ on the right side and 2+ on the left side.      Achilles reflexes are 2+ on the right side and 2+ on the left side. No strength deficit noted in hip and knee flexor and extensor muscle groups.  Ankle flexion and extension intact.  Skin: Skin is warm, dry and intact.  Psychiatric: He has a normal mood and affect.    ED Course  Procedures (including critical care time)  Labs Reviewed - No data to display No results found.   No diagnosis found.  Re-exam of patient after xrays viewed and reviewed.  Pt is not tender at the base of the 4th metacarpal bone,no edema or deformity at this site.  He is tender across his distal ulna,  No ttp across the distal radius.  Radius is the site of prior fracture (11/13).  Pt has right forearm splint which he will continue to wear until re-evaluated by Dr Hilda Lias.  MDM  Pt placed in ASO,  He will use RICE,  Wrist splint on right forearm.  Prescribed ibuprofen,  Percocet.  Pt to call Dr Hilda Lias for further management of his new ankle injury and for re-eval of suspected old wrist fracture.    Patients labs and/or radiological studies were viewed and considered during the medical decision making and disposition process.          Burgess Amor, PA-C 07/01/12 [phone]

## 2012-07-01 NOTE — ED Notes (Signed)
Patient fell tiday from porch approx 4 feet. Patient reports landing on back / right side. Per patient back "slightly sore" but right ankle and right hand pain worse. Right ankle swelling noted.

## 2012-07-03 NOTE — ED Provider Notes (Signed)
Medical screening examination/treatment/procedure(s) were performed by non-physician practitioner and as supervising physician I was immediately available for consultation/collaboration.   Lavora Brisbon M Treylen Gibbs, DO 07/03/12 1204 

## 2012-07-04 ENCOUNTER — Encounter: Payer: Self-pay | Admitting: Internal Medicine

## 2012-07-07 ENCOUNTER — Ambulatory Visit (INDEPENDENT_AMBULATORY_CARE_PROVIDER_SITE_OTHER): Payer: Medicare Other | Admitting: Gastroenterology

## 2012-07-07 ENCOUNTER — Encounter: Payer: Self-pay | Admitting: Gastroenterology

## 2012-07-07 ENCOUNTER — Encounter (HOSPITAL_COMMUNITY): Payer: Self-pay | Admitting: Pharmacy Technician

## 2012-07-07 VITALS — BP 146/93 | HR 56 | Temp 98.2°F | Ht 70.0 in | Wt 160.8 lb

## 2012-07-07 DIAGNOSIS — K921 Melena: Secondary | ICD-10-CM | POA: Insufficient documentation

## 2012-07-07 DIAGNOSIS — K625 Hemorrhage of anus and rectum: Secondary | ICD-10-CM | POA: Insufficient documentation

## 2012-07-07 DIAGNOSIS — R197 Diarrhea, unspecified: Secondary | ICD-10-CM

## 2012-07-07 DIAGNOSIS — Z86010 Personal history of colonic polyps: Secondary | ICD-10-CM

## 2012-07-07 DIAGNOSIS — R634 Abnormal weight loss: Secondary | ICD-10-CM | POA: Insufficient documentation

## 2012-07-07 DIAGNOSIS — R112 Nausea with vomiting, unspecified: Secondary | ICD-10-CM | POA: Insufficient documentation

## 2012-07-07 DIAGNOSIS — Z800 Family history of malignant neoplasm of digestive organs: Secondary | ICD-10-CM

## 2012-07-07 MED ORDER — PEG 3350-KCL-NA BICARB-NACL 420 G PO SOLR: 4000.0000 mL | ORAL | Status: DC

## 2012-07-07 NOTE — Patient Instructions (Addendum)
Please collect stool for studies. We have scheduled you for an upper endoscopy and colonoscopy with Dr. Darrick Penna. Please see separate instructions.

## 2012-07-07 NOTE — Assessment & Plan Note (Signed)
Acute on chronic diarrhea. Multiple courses of antibiotics but none in a couple of months. Patient consumes well water. Previously with unremarkable small bowel biopsies in 2011. Check stool culture, Giardia, C. Difficile. He reports black stools, check I. FOBT.

## 2012-07-07 NOTE — Assessment & Plan Note (Signed)
Personal history of multiple adenomatous colon polyps in the past with last colonoscopy 3 years ago. Several relatives with colon cancer, uncle, cousin, possibly mother. Patient reports recent weight loss and change in bowel movements, rectal bleeding. Would advise colonoscopy at this time for diagnostic purposes. Previously required deep sedation in the OR due to polypharmacy and difficult conscious sedation in the past. Stool studies as planned. Colonoscopy with Dr. Darrick Penna in the OR with deep sedation.  I have discussed the risks, alternatives, benefits with regards to but not limited to the risk of reaction to medication, bleeding, infection, perforation and the patient is agreeable to proceed. Written consent to be obtained.

## 2012-07-07 NOTE — Progress Notes (Signed)
Faxed to PCP

## 2012-07-07 NOTE — Assessment & Plan Note (Signed)
Frequent nausea vomiting, melena, NSAID use. Patient has a history of peptic stricture requiring dilation 3 years ago. Needs evaluation for recurrent stricture based on his symptoms, rule out peptic ulcer disease. EGD with dilation with deep sedation given history of polypharmacy and prior difficult conscious sedation.  I have discussed the risks, alternatives, benefits with regards to but not limited to the risk of reaction to medication, bleeding, infection, perforation and the patient is agreeable to proceed. Written consent to be obtained.  For his history of melena, we'll check I. FOBT.

## 2012-07-07 NOTE — Progress Notes (Signed)
Primary Care Physician:  Reynolds Bowl, MD  Primary Gastroenterologist:  Jonette Eva, MD   Chief Complaint  Patient presents with  . Colonoscopy  . Diarrhea    HPI:  Adam Wheeler is a 57 y.o. male here for consideration of colonoscopy. He has a personal history of multiple colonoscopies since his early 52s. He has had adenomatous polyps on multiple occasions. His last colonoscopy was in March of 2011 by Dr. Darrick Penna. He had at least for tubular adenomas removed at that time. He gives a family history significant for colon cancer in 2 maternal uncles, one first cousin, possibly his mother. His father died he believes of stomach cancer.  Patient has a history of chronic diarrhea, small bowel biopsies were negative for celiac disease back in 2011. For the past couple months he states that his diarrhea has been more persistent. Rarely has a solid stool. Has 2-3 loose black stools daily. He has noticed bright red blood per rectum. Stools are worse postprandially. Doesn't seem to matter what he eats. Occasionally has nocturnal diarrhea. He consumes well water. He has had multiple rounds of antibiotics but none in a few months.  He complains of a 6 pound weight loss in the last couple of weeks. He notes that when he tries to eat he becomes nauseated and has to stop eating. He feels like his esophagus feels up and if he doesn't stop eating he will vomit. He does have some heartburn. He does not appreciate any difficulty swallowing solid foods. Recently he was put on Nexium 40 mg daily in February. Previously had been on omeprazole. Denies any improvement in his symptoms.  Current Outpatient Prescriptions  Medication Sig Dispense Refill  . ALPRAZolam (XANAX) 1 MG tablet Take 1 mg by mouth 3 (three) times daily as needed for sleep or anxiety.      Marland Kitchen FLUoxetine (PROZAC) 40 MG capsule Take 40 mg by mouth every morning.      Marland Kitchen ibuprofen (ADVIL,MOTRIN) 600 MG tablet Take 1 tablet (600 mg total) by mouth  every 6 (six) hours as needed for pain.  20 tablet  0  . NEXIUM 40 MG capsule 40 mg daily.      . polyethylene glycol-electrolytes (TRILYTE) 420 G solution Take 4,000 mLs by mouth as directed.  4000 mL  0   No current facility-administered medications for this visit.    Allergies as of 07/07/2012  . (No Known Allergies)    Past Medical History  Diagnosis Date  . Anxiety   . Colon polyps     adenomatous polyps on multiple colonoscopies, starting in his early 16s  . Stroke 2005    post knee surgery in 2005  . COPD (chronic obstructive pulmonary disease)   . Ankle fracture, right 06/2012  . Kidney stones     Past Surgical History  Procedure Laterality Date  . Colonoscopy  06/08/2004    Dr. Rehman:Small polyp cold snared from transverse colon/ Small external hemorrhoids  . Colonoscopy  06/16/09    RUE:AVWUJ internal hemorrhoids/simple adenomas (four polyps removed). Random colon biopsies negative for microscopic colitis. Screening colonoscopy in 5 years with propofol recommended by Dr. Darrick Penna.  . Esophagogastroduodenoscopy  06/16/09    SLF: distal peptic stricture, mild erythema, esophagus dilated to 16 mm. Patchy erythema in the antrum with occasional erosion with active oozing. Patchy erythema in the duodenal bulb. Small bowel biopsies benign. Minimal chronic gastritis with no H. pylori.  . Left knee surgery  2005    Family  History  Problem Relation Age of Onset  . Cancer Father     deceased age 58, metastatic cancer ?stomach  . Colon cancer Mother     diagnosed in her 9s, deceased at 47  . Colon cancer Other     2 maternal uncles and one first cousin  . Breast cancer Mother     History   Social History  . Marital Status: Divorced    Spouse Name: N/A    Number of Children: 4  . Years of Education: N/A   Occupational History  . Disabled    Social History Main Topics  . Smoking status: Current Every Day Smoker -- 0.03 packs/day for 40 years    Types: Cigarettes  .  Smokeless tobacco: Never Used  . Alcohol Use: No  . Drug Use: No  . Sexually Active: Not on file   Other Topics Concern  . Not on file   Social History Narrative  . No narrative on file      ROS:  General: Negative for anorexia, fever, chills, fatigue, weakness. See history of present illness Eyes: Negative for vision changes.  ENT: Negative for hoarseness, difficulty swallowing , nasal congestion. CV: Negative for chest pain, angina, palpitations, dyspnea on exertion, peripheral edema.  Respiratory: Negative for dyspnea at rest, dyspnea on exertion, sputum, wheezing. Chronic cough GI: See history of present illness. GU:  Negative for dysuria, hematuria, urinary incontinence, urinary frequency, nocturnal urination.  MS: Negative for low back pain. Right ankle pain, current fracture.  Derm: Negative for rash or itching.  Neuro: Negative for weakness, abnormal sensation, seizure, frequent headaches, memory loss, confusion.  Psych: Negative for anxiety, depression, suicidal ideation, hallucinations.  Endo: 6 pound weight loss in 2 weeks.  Heme: Negative for bruising or bleeding. Allergy: Negative for rash or hives.    Physical Examination:  BP 146/93  Pulse 56  Temp(Src) 98.2 F (36.8 C) (Oral)  Ht 5\' 10"  (1.778 m)  Wt 160 lb 12.8 oz (72.938 kg)  BMI 23.07 kg/m2   General: Well-nourished, well-developed in no acute distress. Accompanied by fianc Head: Normocephalic, atraumatic.   Eyes: Conjunctiva pink, no icterus. Mouth: Oropharyngeal mucosa moist and pink , no lesions erythema or exudate. Poor dentition. Neck: Supple without thyromegaly, masses, or lymphadenopathy.  Lungs: Clear to auscultation bilaterally.  Heart: Regular rate and rhythm, no murmurs rubs or gallops.  Abdomen: Bowel sounds are normal, nontender, nondistended, no hepatosplenomegaly or masses, no abdominal bruits or    hernia , no rebound or guarding.   Rectal: deferred to time of  colonoscopy Extremities: No lower extremity edema. No clubbing or deformities.  Neuro: Alert and oriented x 4 , grossly normal neurologically.  Skin: Warm and dry, no rash or jaundice.   Psych: Alert and cooperative, normal mood and affect.  Labs: Labs from 06/10/2012, white blood cell count 6800, hemoglobin 14.5, hematocrit 44.3, MCV 97.3, platelets 232,000.  Imaging Studies: Dg Lumbar Spine Complete  07/01/2012  *RADIOLOGY REPORT*  Clinical Data: Low back pain following a fall today.  LUMBAR SPINE - COMPLETE 4+ VIEW  Comparison: Abdomen pelvis CT dated 09/18/2009.  Findings: Five non-rib bearing lumbar vertebrae.  Minimal dextroconvex scoliosis.  Anterior spur formation in the mid and lower lumbar spine with moderate to marked disc space narrowing at the L5-S1 level, all unchanged.  No fractures, pars defects or subluxations.  IMPRESSION:  1.  No fracture or subluxation. 2.  Stable degenerative changes.   Original Report Authenticated By: Beckie Salts, M.D.  Dg Ankle Complete Right  07/01/2012  *RADIOLOGY REPORT*  Clinical Data: Right ankle pain and swelling following a fall today.  RIGHT ANKLE - COMPLETE 3+ VIEW  Comparison: None.  Findings: Linear calcific density lateral to the talus.  Tiny linear calcific density adjacent to the distal aspect of the lateral malleolus.  There is evidence of an ankle joint effusion. Also noted is significant lateral soft tissue swelling.  IMPRESSION: Small linear avulsion fractures off the lateral aspect of the talus and distal aspect of the lateral malleolus with an associated effusion and soft tissue swelling.   Original Report Authenticated By: Beckie Salts, M.D.    Dg Hand Complete Right  07/01/2012  *RADIOLOGY REPORT*  Clinical Data: Larey Seat on ice, swelling right hand over fourth metacarpal  RIGHT HAND - COMPLETE 3+ VIEW  Comparison: None.  Findings: The base of the fourth metacarpal shows mild contour irregularity.  A definite fracture is not appreciated but  this area of anatomy is only seen on one of the three projections.  It is obscured by overlying osseous anatomy on the other two projections. There is mild deformity of the distal radial metaphysis, with evidence of both a horizontal and obliquely oriented linear defect suggesting fracture, age uncertain.  IMPRESSION: 1.  Cannot exclude a fracture at the base of the fourth metacarpal. Consider CT scan to better characterize, as this portion of anatomy is only seen well on one of the three projections. 2.  Fractured distal radial metaphysis age uncertain.  It may be a subacute to chronic fracture.  Please correlate clinically for any history of prior trauma.   Original Report Authenticated By: Esperanza Heir, M.D.

## 2012-07-08 LAB — CBC
HCT: 44 %
Hemoglobin: 14.5 g/dL (ref 13.5–17.5)
MCV: 97.3 fL
WBC: 6.8
platelet count: 232

## 2012-07-16 ENCOUNTER — Encounter (HOSPITAL_COMMUNITY): Admission: RE | Admit: 2012-07-16 | Payer: Medicare Other | Source: Ambulatory Visit

## 2012-07-16 ENCOUNTER — Telehealth: Payer: Self-pay | Admitting: Gastroenterology

## 2012-07-16 NOTE — Telephone Encounter (Signed)
Pt called to let us know he forgot about his pre op today at 10 and needs to [facility] [phone]

## 2012-07-16 NOTE — Telephone Encounter (Signed)
Told to call Jules Schick in Scheduling

## 2012-07-17 ENCOUNTER — Ambulatory Visit (INDEPENDENT_AMBULATORY_CARE_PROVIDER_SITE_OTHER): Payer: Medicare Other | Admitting: Gastroenterology

## 2012-07-17 DIAGNOSIS — R197 Diarrhea, unspecified: Secondary | ICD-10-CM

## 2012-07-17 LAB — IFOBT (OCCULT BLOOD): IFOBT: NEGATIVE

## 2012-07-17 NOTE — Patient Instructions (Addendum)
ALBERTA CAIRNS  07/17/2012   Your procedure is scheduled on:  07/22/12  Report to [facility] at 0800 AM.  Call this number if you have problems the morning of surgery: [phone]   Remember:   Do not eat food or drink liquids after midnight.   Take these medicines the morning of surgery with A SIP OF WATER: xanax, prozac, nexium   Do not wear jewelry, make-up or nail polish.  Do not wear lotions, powders, or perfumes. You may wear deodorant.  Do not shave 48 hours prior to surgery. Men may shave face and neck.  Do not bring valuables to the hospital.  Contacts, dentures or bridgework may not be worn into surgery.  Leave suitcase in the car. After surgery it may be brought to your room.  For patients admitted to the hospital, checkout time is 11:00 AM the day of  discharge.   Patients discharged the day of surgery will not be allowed to drive  home.  Name and phone number of your driver: family  Special Instructions: N/A   Please read over the following fact sheets that you were given: Anesthesia Post-op Instructions and Care and Recovery After Surgery   PATIENT INSTRUCTIONS POST-ANESTHESIA  IMMEDIATELY FOLLOWING SURGERY:  Do not drive or operate machinery for the first twenty four hours after surgery.  Do not make any important decisions for twenty four hours after surgery or while taking narcotic pain medications or sedatives.  If you develop intractable nausea and vomiting or a severe headache please notify your doctor immediately.  FOLLOW-UP:  Please make an appointment with your surgeon as instructed. You do not need to follow up with anesthesia unless specifically instructed to do so.  WOUND CARE INSTRUCTIONS (if applicable):  Keep a dry clean dressing on the anesthesia/puncture wound site if there is drainage.  Once the wound has quit draining you may leave it open to air.  Generally you should leave the bandage intact for twenty four hours unless there is drainage.  If the  epidural site drains for more than 36-48 hours please call the anesthesia department.  QUESTIONS?:  Please feel free to call your physician or the hospital operator if you have any questions, and they will be happy to assist you.      Esophagogastroduodenoscopy This is an endoscopic procedure (a procedure that uses a device like a flexible telescope) that allows your caregiver to view the upper stomach and small bowel. This test allows your caregiver to look at the esophagus. The esophagus carries food from your mouth to your stomach. They can also look at your duodenum. This is the first part of the small intestine that attaches to the stomach. This test is used to detect problems in the bowel such as ulcers and inflammation. PREPARATION FOR TEST Nothing to eat after midnight the day before the test. NORMAL FINDINGS Normal esophagus, stomach, and duodenum. Ranges for normal findings may vary among different laboratories and hospitals. You should always check with your doctor after having lab work or other tests done to discuss the meaning of your test results and whether your values are considered within normal limits. MEANING OF TEST  Your caregiver will go over the test results with you and discuss the importance and meaning of your results, as well as treatment options and the need for additional tests if necessary. OBTAINING THE TEST RESULTS It is your responsibility to obtain your test results. Ask the lab or department performing the test when and  how you will get your results. Document Released: 08/03/2004 Document Revised: 06/25/2011 Document Reviewed: 03/12/2008 [facility] Patient Information 2013 [place], [place]. Esophageal Dilatation The esophagus is the long, narrow tube which carries food and liquid from the mouth to the stomach. Esophageal dilatation is the technique used to stretch a blocked or narrowed portion of the esophagus. This procedure is used when a part of the esophagus has  become so narrow that it becomes difficult, painful or even impossible to swallow. This is generally an uncomplicated form of treatment. When this is not successful, chest surgery may be required. This is a much more extensive form of treatment with a longer recovery time. CAUSES  Some of the more common causes of blockage or strictures of the esophagus are:  Narrowing from longstanding inflammation (soreness and redness) of the lower esophagus. This comes from the constant exposure of the lower esophagus to the acid which bubbles up from the stomach. Over time this causes scarring and narrowing of the lower esophagus.  Hiatal hernia in which a small part of the stomach bulges (herniates) up through the diaphragm. This can cause a gradual narrowing of the end of the esophagus.  Schatzki's Ring is a narrow ring of benign (non-cancerous) fibrous tissue which constricts the lower esophagus. The reason for this is not known.  Scleroderma is a connective tissue disorder that affects the esophagus and makes swallowing difficult.  Achalasia is an absence of nerves to the lower esophagus and to the esophageal sphincter. This is the circular muscle between the stomach and esophagus that relaxes to allow food into the stomach. After swallowing, it contracts to keep food in the stomach. This absence of nerves may be congenital (present since birth). This can cause irregular spasms of the lower esophageal muscle. This spasm does not open up to allow food and fluid through. The result is a persistent blockage with subsequent slow trickling of the esophageal contents into the stomach.  Strictures may develop from swallowing materials which damage the esophagus. Some examples are strong acids or alkalis such as lye.  Growths such as benign (non-cancerous) and malignant (cancerous) tumors can block the esophagus.  Heredity (present since birth) causes. DIAGNOSIS  Your caregiver often suspects this problem by  taking a medical history. They will also do a physical exam. They can then prove their suspicions using X-rays and endoscopy. Endoscopy is an exam in which a tube like a small flexible telescope is used to look at your esophagus.  TREATMENT There are different stretching (dilating) techniques which can be used. Simple bougie dilatation may be done in the office. This usually takes only a couple minutes. A numbing (anesthetic) spray of the throat is used. Endoscopy, when done, is done in an endoscopy suite, under mild sedation. When fluoroscopy is used, the procedure is performed in X-ray. Other techniques require a little longer time. Recovery is usually quick. There is no waiting time to begin eating and drinking to test success of the treatment. Following are some of the methods used. Narrowing of the esophagus is treated by making it bigger. Commonly this is a mechanical problem which can be treated with stretching. This can be done in different ways. Your caregiver will discuss these with you. Some of the means used are:  A series of graduated (increasing thickness) flexible dilators can be used. These are weighted tubes passed through the esophagus into the stomach. The tubes used become progressively larger until the desired stretched size is reached. Graduated dilators are a  simple and quick way of opening the esophagus. No visualization is required.  Another method is the use of endoscopy to place a flexible wire across the stricture. The endoscope is removed and the wire left in place. A dilator with a hole through it from end to end is guided down the esophagus and across the stricture. One or more of these dilators are passed over the wire. At the end of the exam, the wire is removed. This type of treatment may be performed in the X-ray department under fluoroscopy. An advantage of this procedure is the examiner is visualizing the end opening in the esophagus.  Stretching of the esophagus may be  done using balloons. Deflated balloons are placed through the endoscope and across the stricture. This type of balloon dilatation is often done at the time of endoscopy or fluoroscopy. Flexible endoscopy allows the examiner to directly view the stricture. A balloon is inserted in the deflated form into the area of narrowing. It is then inflated with air to a certain pressure that is pre-set for a given circumference. When inflated, it becomes sausage shaped, stretched, and makes the stricture larger.  Achalasia requires a longer larger balloon-type dilator. This is frequently done under X-ray control. In this situation, the spastic muscle fibers in the lower esophagus are stretched. All of the above procedures make the passage of food and water into the stomach easier. They also make it easier for stomach contents to reflux back into the esophagus. Special medications may be used following the procedure to help prevent further stricturing. Proton-pump inhibitor medications are good at decreasing the amount of acid in the stomach juice. When stomach juice refluxes into the esophagus, the juice is no longer as acidic and is less likely to burn or scar the esophagus. RISKS AND COMPLICATIONS Esophageal dilatation is usually performed effectively and without problems. Some complications that can occur are:  A small amount of bleeding almost always happens where the stretching takes place. If this is too excessive it may require more aggressive treatment.  An uncommon complication is perforation (making a hole) of the esophagus. The esophagus is thin. It is easy to make a hole in it. If this happens, an operation may be necessary to repair this.  A small, undetected perforation could lead to an infection in the chest. This can be very serious. HOME CARE INSTRUCTIONS   If you received sedation for your procedure, do not drive, make important decisions, or perform any activities requiring your full  coordination. Do not drink alcohol, take sedatives, or use any mind altering chemicals unless instructed by your caregiver.  You may use throat lozenges or warm salt water gargles if you have throat discomfort  You can begin eating and drinking normally on return home unless instructed otherwise. Do not purposely try to force large chunks of food down to test the benefits of your procedure.  Mild discomfort can be eased with sips of ice water.  Medications for discomfort may or may not be needed. SEEK IMMEDIATE MEDICAL CARE IF:   You begin vomiting up blood.  You develop black tarry stools  You develop chills or an unexplained temperature of over 101 F (38.3 C)  You develop chest or abdominal pain.  You develop shortness of breath or feel lightheaded or faint.  Your swallowing is becoming more painful, difficult, or you are unable to swallow. MAKE SURE YOU:   Understand these instructions.  Will watch your condition.  Will get help right away  if you are not doing well or get worse. Document Released: 05/24/2005 Document Revised: 06/25/2011 Document Reviewed: 07/11/2005 [facility] Patient Information 2013 [place], [place]. Colonoscopy A colonoscopy is an exam to evaluate your entire colon. In this exam, your colon is cleansed. A long fiberoptic tube is inserted through your rectum and into your colon. The fiberoptic scope (endoscope) is a long bundle of enclosed and very flexible fibers. These fibers transmit light to the area examined and send images from that area to your caregiver. Discomfort is usually minimal. You may be given a drug to help you sleep (sedative) during or prior to the procedure. This exam helps to detect lumps (tumors), polyps, inflammation, and areas of bleeding. Your caregiver may also take a small piece of tissue (biopsy) that will be examined under a microscope. LET YOUR CAREGIVER KNOW ABOUT:   Allergies to food or medicine.  Medicines taken, including  vitamins, herbs, eyedrops, over-the-counter medicines, and creams.  Use of steroids (by mouth or creams).  Previous problems with anesthetics or numbing medicines.  History of bleeding problems or blood clots.  Previous surgery.  Other health problems, including diabetes and kidney problems.  Possibility of pregnancy, if this applies. BEFORE THE PROCEDURE   A clear liquid diet may be required for 2 days before the exam.  Ask your caregiver about changing or stopping your regular medications.  Liquid injections (enemas) or laxatives may be required.  A large amount of electrolyte solution may be given to you to drink over a short period of time. This solution is used to clean out your colon.  You should be present 60 minutes prior to your procedure or as directed by your caregiver. AFTER THE PROCEDURE   If you received a sedative or pain relieving medication, you will need to arrange for someone to drive you home.  Occasionally, there is a little blood passed with the first bowel movement. Do not be concerned. FINDING OUT THE RESULTS OF YOUR TEST Not all test results are available during your visit. If your test results are not back during the visit, make an appointment with your caregiver to find out the results. Do not assume everything is normal if you have not heard from your caregiver or the medical facility. It is important for you to follow up on all of your test results. HOME CARE INSTRUCTIONS   It is not unusual to pass moderate amounts of gas and experience mild abdominal cramping following the procedure. This is due to air being used to inflate your colon during the exam. Walking or a warm pack on your belly (abdomen) may help.  You may resume all normal meals and activities after sedatives and medicines have worn off.  Only take over-the-counter or prescription medicines for pain, discomfort, or fever as directed by your caregiver. Do not use aspirin or blood thinners  if a biopsy was taken. Consult your caregiver for medicine usage if biopsies were taken. SEEK IMMEDIATE MEDICAL CARE IF:   You have a fever.  You pass large blood clots or fill a toilet with blood following the procedure. This may also occur 10 to 14 days following the procedure. This is more likely if a biopsy was taken.  You develop abdominal pain that keeps getting worse and cannot be relieved with medicine. Document Released: 03/30/2000 Document Revised: 06/25/2011 Document Reviewed: 11/13/2007 [facility] Patient Information 2013 [place], [place].

## 2012-07-18 ENCOUNTER — Encounter (HOSPITAL_COMMUNITY)
Admission: RE | Admit: 2012-07-18 | Discharge: 2012-07-18 | Disposition: A | Discharge status: Home / Self Care | Payer: Medicare Other | Source: Ambulatory Visit | Attending: Gastroenterology | Admitting: Gastroenterology

## 2012-07-18 ENCOUNTER — Encounter (HOSPITAL_COMMUNITY): Payer: Self-pay

## 2012-07-18 ENCOUNTER — Other Ambulatory Visit: Payer: Self-pay

## 2012-07-18 HISTORY — DX: Essential (primary) hypertension: I10

## 2012-07-18 HISTORY — DX: Sleep apnea, unspecified: G47.30

## 2012-07-18 HISTORY — DX: Personal history of other venous thrombosis and embolism: Z86.718

## 2012-07-18 HISTORY — DX: Major depressive disorder, single episode, unspecified: F32.9

## 2012-07-18 HISTORY — DX: Depression, unspecified: F32.A

## 2012-07-18 HISTORY — DX: Unspecified convulsions: R56.9

## 2012-07-18 HISTORY — DX: Unspecified osteoarthritis, unspecified site: M19.90

## 2012-07-18 LAB — HEMOGLOBIN AND HEMATOCRIT, BLOOD
HCT: 42.3 % (ref 39.0–52.0)
Hemoglobin: 14.4 g/dL (ref 13.0–17.0)

## 2012-07-18 LAB — BASIC METABOLIC PANEL
BUN: 12 mg/dL (ref 6–23)
CO2: 27 mEq/L (ref 19–32)
Calcium: 9 mg/dL (ref 8.4–10.5)
Chloride: 102 mEq/L (ref 96–112)
Creatinine, Ser: 1.03 mg/dL (ref 0.50–1.35)
GFR calc Af Amer: >90 mL/min (ref >90)
GFR calc non Af Amer: 80 mL/min — ABNORMAL LOW (ref >90)
Glucose, Bld: 117 mg/dL — ABNORMAL HIGH (ref 70–99)
Potassium: 4.3 mEq/L (ref 3.5–5.1)
Sodium: 138 mEq/L (ref 135–145)

## 2012-07-18 LAB — GIARDIA/CRYPTOSPORIDIUM (EIA)
Cryptosporidium Screen (EIA): NEGATIVE
Giardia Screen (EIA): NEGATIVE

## 2012-07-18 LAB — CLOSTRIDIUM DIFFICILE BY PCR: Toxigenic C. Difficile by PCR: NOT DETECTED

## 2012-07-18 NOTE — Progress Notes (Signed)
07/18/12 1001  OBSTRUCTIVE SLEEP APNEA  Have you ever been diagnosed with sleep apnea through a sleep study? Yes  If yes, do you have and use a CPAP or BPAP machine every night? 0  Do you snore loudly (loud enough to be heard through closed doors)?  0  Do you often feel tired, fatigued, or sleepy during the daytime? 1  Has anyone observed you stop breathing during your sleep? 1  Do you have, or are you being treated for high blood pressure? 1  BMI more than 35 kg/m2? 0  Age over 34 years old? 1  Neck circumference greater than 40 cm/18 inches? 0  Gender: 1  Obstructive Sleep Apnea Score 5  Score 4 or greater  Results sent to PCP

## 2012-07-18 NOTE — Progress Notes (Signed)
See result note.  

## 2012-07-21 LAB — STOOL CULTURE

## 2012-07-22 ENCOUNTER — Encounter (HOSPITAL_COMMUNITY)
Admission: RE | Disposition: A | Discharge status: Home / Self Care | Payer: Self-pay | Source: Ambulatory Visit | Attending: Gastroenterology

## 2012-07-22 ENCOUNTER — Encounter (HOSPITAL_COMMUNITY): Payer: Self-pay | Admitting: *Deleted

## 2012-07-22 ENCOUNTER — Ambulatory Visit (HOSPITAL_COMMUNITY)
Admission: RE | Admit: 2012-07-22 | Discharge: 2012-07-22 | Disposition: A | Discharge status: Home / Self Care | Payer: Medicare Other | Source: Ambulatory Visit | Attending: Gastroenterology | Admitting: Gastroenterology

## 2012-07-22 ENCOUNTER — Encounter (HOSPITAL_COMMUNITY): Payer: Self-pay | Admitting: Anesthesiology

## 2012-07-22 ENCOUNTER — Ambulatory Visit (HOSPITAL_COMMUNITY): Payer: Medicare Other | Admitting: Anesthesiology

## 2012-07-22 DIAGNOSIS — K2940 Chronic atrophic gastritis without bleeding: Secondary | ICD-10-CM | POA: Insufficient documentation

## 2012-07-22 DIAGNOSIS — K2990 Gastroduodenitis, unspecified, without bleeding: Secondary | ICD-10-CM

## 2012-07-22 DIAGNOSIS — I10 Essential (primary) hypertension: Secondary | ICD-10-CM | POA: Insufficient documentation

## 2012-07-22 DIAGNOSIS — R197 Diarrhea, unspecified: Secondary | ICD-10-CM

## 2012-07-22 DIAGNOSIS — R1310 Dysphagia, unspecified: Secondary | ICD-10-CM | POA: Insufficient documentation

## 2012-07-22 DIAGNOSIS — K648 Other hemorrhoids: Secondary | ICD-10-CM | POA: Insufficient documentation

## 2012-07-22 DIAGNOSIS — J4489 Other specified chronic obstructive pulmonary disease: Secondary | ICD-10-CM | POA: Insufficient documentation

## 2012-07-22 DIAGNOSIS — K625 Hemorrhage of anus and rectum: Secondary | ICD-10-CM

## 2012-07-22 DIAGNOSIS — K222 Esophageal obstruction: Secondary | ICD-10-CM | POA: Insufficient documentation

## 2012-07-22 DIAGNOSIS — Z01812 Encounter for preprocedural laboratory examination: Secondary | ICD-10-CM | POA: Insufficient documentation

## 2012-07-22 DIAGNOSIS — K2970 Gastritis, unspecified, without bleeding: Secondary | ICD-10-CM

## 2012-07-22 DIAGNOSIS — Z01810 Encounter for preprocedural cardiovascular examination: Secondary | ICD-10-CM | POA: Insufficient documentation

## 2012-07-22 DIAGNOSIS — K2980 Duodenitis without bleeding: Secondary | ICD-10-CM | POA: Insufficient documentation

## 2012-07-22 DIAGNOSIS — J449 Chronic obstructive pulmonary disease, unspecified: Secondary | ICD-10-CM | POA: Insufficient documentation

## 2012-07-22 HISTORY — PX: ESOPHAGOGASTRODUODENOSCOPY (EGD) WITH PROPOFOL: SHX5813

## 2012-07-22 HISTORY — PX: BIOPSY: SHX5522

## 2012-07-22 HISTORY — PX: SAVORY DILATION: SHX5439

## 2012-07-22 HISTORY — PX: COLONOSCOPY WITH PROPOFOL: SHX5780

## 2012-07-22 SURGERY — COLONOSCOPY WITH PROPOFOL
Anesthesia: Monitor Anesthesia Care

## 2012-07-22 MED ORDER — WATER FOR IRRIGATION, STERILE IR SOLN
Status: DC | PRN
  Administered 2012-07-22: 1000 mL

## 2012-07-22 MED ORDER — MIDAZOLAM HCL 5 MG/5ML IJ SOLN
INTRAMUSCULAR | Status: DC | PRN
  Administered 2012-07-22: 1 mg via INTRAVENOUS
  Administered 2012-07-22 (×2): 0.5 mg via INTRAVENOUS

## 2012-07-22 MED ORDER — PROPOFOL INFUSION 10 MG/ML OPTIME
INTRAVENOUS | Status: DC | PRN
  Administered 2012-07-22 (×2): 100 ug/kg/min via INTRAVENOUS

## 2012-07-22 MED ORDER — FENTANYL CITRATE 0.05 MG/ML IJ SOLN
INTRAMUSCULAR | Status: DC | PRN
  Administered 2012-07-22 (×3): 25 ug via INTRAVENOUS

## 2012-07-22 MED ORDER — ONDANSETRON HCL 4 MG/2ML IJ SOLN
INTRAMUSCULAR | Status: AC
  Filled 2012-07-22: qty 2

## 2012-07-22 MED ORDER — ONDANSETRON HCL 4 MG/2ML IJ SOLN: 4.0000 mg | Freq: Once | INTRAMUSCULAR | Status: DC | PRN

## 2012-07-22 MED ORDER — MIDAZOLAM HCL 2 MG/2ML IJ SOLN
INTRAMUSCULAR | Status: AC
  Filled 2012-07-22: qty 2

## 2012-07-22 MED ORDER — FENTANYL CITRATE 0.05 MG/ML IJ SOLN
25.0000 ug | INTRAMUSCULAR | Status: DC | PRN
  Administered 2012-07-22: 25 ug via INTRAVENOUS

## 2012-07-22 MED ORDER — STERILE WATER FOR IRRIGATION IR SOLN
Status: DC | PRN
  Administered 2012-07-22: 09:00:00

## 2012-07-22 MED ORDER — FENTANYL CITRATE 0.05 MG/ML IJ SOLN: 25.0000 ug | INTRAMUSCULAR | Status: DC | PRN

## 2012-07-22 MED ORDER — MIDAZOLAM HCL 2 MG/2ML IJ SOLN
1.0000 mg | INTRAMUSCULAR | Status: DC | PRN
  Administered 2012-07-22: 2 mg via INTRAVENOUS

## 2012-07-22 MED ORDER — FENTANYL CITRATE 0.05 MG/ML IJ SOLN
INTRAMUSCULAR | Status: AC
  Filled 2012-07-22: qty 2

## 2012-07-22 MED ORDER — GLYCOPYRROLATE 0.2 MG/ML IJ SOLN
0.2000 mg | Freq: Once | INTRAMUSCULAR | Status: AC
  Administered 2012-07-22: 0.2 mg via INTRAVENOUS

## 2012-07-22 MED ORDER — LACTATED RINGERS IV SOLN
INTRAVENOUS | Status: DC
  Administered 2012-07-22: 1000 mL via INTRAVENOUS

## 2012-07-22 MED ORDER — PROPOFOL 10 MG/ML IV EMUL
INTRAVENOUS | Status: AC
  Filled 2012-07-22: qty 20

## 2012-07-22 MED ORDER — ONDANSETRON HCL 4 MG/2ML IJ SOLN
4.0000 mg | Freq: Once | INTRAMUSCULAR | Status: AC
  Administered 2012-07-22: 4 mg via INTRAVENOUS

## 2012-07-22 MED ORDER — BUTAMBEN-TETRACAINE-BENZOCAINE 2-2-14 % EX AERO
1.0000 | INHALATION_SPRAY | Freq: Once | CUTANEOUS | Status: AC
  Administered 2012-07-22: 1 via TOPICAL
  Filled 2012-07-22: qty 56

## 2012-07-22 MED ORDER — GLYCOPYRROLATE 0.2 MG/ML IJ SOLN
INTRAMUSCULAR | Status: AC
  Filled 2012-07-22: qty 1

## 2012-07-22 SURGICAL SUPPLY — 24 items
BLOCK BITE 60FR ADLT L/F BLUE (MISCELLANEOUS) ×2 IMPLANT
ELECT REM PT RETURN 9FT ADLT (ELECTROSURGICAL)
ELECTRODE REM PT RTRN 9FT ADLT (ELECTROSURGICAL) IMPLANT
FCP BXJMBJMB 240X2.8X (CUTTING FORCEPS)
FLOOR PAD 36X40 (MISCELLANEOUS) ×2
FORCEP RJ3 GP 1.8X160 W-NEEDLE (CUTTING FORCEPS) IMPLANT
FORCEPS BIOP RAD 4 LRG CAP 4 (CUTTING FORCEPS) ×4 IMPLANT
FORCEPS BIOP RJ4 240 W/NDL (CUTTING FORCEPS)
FORCEPS BXJMBJMB 240X2.8X (CUTTING FORCEPS) IMPLANT
INJECTOR/SNARE I SNARE (MISCELLANEOUS) IMPLANT
LUBRICANT JELLY 4.5OZ STERILE (MISCELLANEOUS) ×2 IMPLANT
MANIFOLD NEPTUNE II (INSTRUMENTS) ×2 IMPLANT
NEEDLE SCLEROTHERAPY 25GX240 (NEEDLE) IMPLANT
PAD FLOOR 36X40 (MISCELLANEOUS) ×1 IMPLANT
PROBE APC STR FIRE (PROBE) IMPLANT
PROBE INJECTION GOLD (MISCELLANEOUS)
PROBE INJECTION GOLD 7FR (MISCELLANEOUS) IMPLANT
SNARE ROTATE MED OVAL 20MM (MISCELLANEOUS) IMPLANT
SNARE SHORT THROW 13M SML OVAL (MISCELLANEOUS) ×2 IMPLANT
SYR 50ML LL SCALE MARK (SYRINGE) ×2 IMPLANT
TRAP SPECIMEN MUCOUS 40CC (MISCELLANEOUS) ×2 IMPLANT
TUBING ENDO SMARTCAP PENTAX (MISCELLANEOUS) ×2 IMPLANT
TUBING IRRIGATION ENDOGATOR (MISCELLANEOUS) ×2 IMPLANT
WATER STERILE IRR 1000ML POUR (IV SOLUTION) ×4 IMPLANT

## 2012-07-22 NOTE — H&P (Signed)
Primary Care Physician:  Reynolds Bowl, MD Primary Gastroenterologist:  Dr. Darrick Penna  Pre-Procedure History & Physical: HPI:  Adam Wheeler is a 57 y.o. male here for BLOODY Diarrhea/WEIGHT LOSS/DYSPHAGIA.  Past Medical History  Diagnosis Date  . Anxiety   . Colon polyps     adenomatous polyps on multiple colonoscopies, starting in his early 67s  . Stroke 2005    post knee surgery in 2005  . COPD (chronic obstructive pulmonary disease)   . Ankle fracture, right 06/2012  . Kidney stones   . Depression   . Hypertension   . Sleep apnea   . Arthritis   . Seizures     has seizures weekly  . History of DVT (deep vein thrombosis)     Past Surgical History  Procedure Laterality Date  . Colonoscopy  06/08/2004    Dr. Rehman:Small polyp cold snared from transverse colon/ Small external hemorrhoids  . Colonoscopy  06/16/09    ZOX:WRUEA internal hemorrhoids/simple adenomas (four polyps removed). Random colon biopsies negative for microscopic colitis. Screening colonoscopy in 5 years with propofol recommended by Dr. Darrick Penna.  . Esophagogastroduodenoscopy  06/16/09    SLF: distal peptic stricture, mild erythema, esophagus dilated to 16 mm. Patchy erythema in the antrum with occasional erosion with active oozing. Patchy erythema in the duodenal bulb. Small bowel biopsies benign. Minimal chronic gastritis with no H. pylori.  . Left knee surgery  2005    Prior to Admission medications   Medication Sig Start Date End Date Taking? Authorizing Provider  ALPRAZolam Prudy Feeler) 1 MG tablet Take 1 mg by mouth 3 (three) times daily as needed for sleep or anxiety.   Yes Historical Provider, MD  FLUoxetine (PROZAC) 40 MG capsule Take 40 mg by mouth every morning.   Yes Historical Provider, MD  ibuprofen (ADVIL,MOTRIN) 600 MG tablet Take 1 tablet (600 mg total) by mouth every 6 (six) hours as needed for pain. 07/01/12  Yes Raynelle Fanning Idol, PA-C  NEXIUM 40 MG capsule Take 40 mg by mouth daily.  06/10/12  Yes  Historical Provider, MD    Allergies as of 07/07/2012  . (No Known Allergies)    Family History  Problem Relation Age of Onset  . Cancer Father     deceased age 42, metastatic cancer ?stomach  . Colon cancer Mother     diagnosed in her 6s, deceased at 14  . Colon cancer Other     2 maternal uncles and one first cousin  . Breast cancer Mother     History   Social History  . Marital Status: Divorced    Spouse Name: N/A    Number of Children: 4  . Years of Education: N/A   Occupational History  . Disabled    Social History Main Topics  . Smoking status: Current Every Day Smoker -- 0.25 packs/day for 40 years    Types: Cigarettes  . Smokeless tobacco: Never Used  . Alcohol Use: No  . Drug Use: No  . Sexually Active: Not on file   Other Topics Concern  . Not on file   Social History Narrative  . No narrative on file    Review of Systems: See HPI, otherwise negative ROS   Physical Exam: Temp(Src) 98.7 F (37.1 C) (Oral)  Ht 5\' 10"  (1.778 m)  Wt 160 lb (72.576 kg)  BMI 22.96 kg/m2 General:   Alert,  pleasant and cooperative in NAD Head:  Normocephalic and atraumatic. Neck:  Supple; Lungs:  Clear throughout to auscultation.  Heart:  Regular rate and rhythm. Abdomen:  Soft, nontender and nondistended. Normal bowel sounds, without guarding, and without rebound.   Neurologic:  Alert and  oriented x4;  grossly normal neurologically.  Impression/Plan:    BLOODY Diarrhea/WEIGHT LOSS/DYSPHAGIA   PLAN: TCS/EGD/DIL TODAY WITH BIOPSY

## 2012-07-22 NOTE — Anesthesia Postprocedure Evaluation (Signed)
Anesthesia Post Note  Patient: Adam Wheeler  Procedure(s) Performed: Procedure(s) (LRB): COLONOSCOPY WITH PROPOFOL (N/A) ESOPHAGOGASTRODUODENOSCOPY (EGD) WITH PROPOFOL (N/A) SAVORY DILATION (N/A) BIOPSY (N/A)  Anesthesia type: Spinal  Patient location: PACU  Post pain: Pain level controlled  Post assessment: Post-op Vital signs reviewed, Patient's Cardiovascular Status Stable, Respiratory Function Stable, Patent Airway, No signs of Nausea or vomiting and Pain level controlled  Last Vitals:  Filed Vitals:   07/22/12 0928  BP: 114/81  Pulse: 59  Temp: 36.6 C  Resp: 16    Post vital signs: Reviewed and stable  Level of consciousness: awake and alert   Complications: No apparent anesthesia complications

## 2012-07-22 NOTE — Discharge Instructions (Addendum)
YOUR DIARRHEA IS MOST LIKELY DUE TO IBS. YOUR SMALL BOWEL AND COLON LOOK NORMAL. YOU DID NOT HAVE ANY POLYPS. YOUR RECTAL BLEEDING IS DUE TO internal hemorrhoids. 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  YOU HAVE GASTRITIS FROM USING IBUPROFEN. I biopsied your stomach, SMALL BOWEL, AND COLON.    CONTINUE NEXIUM. TAKE 30 MINUTES PRIOR TO YOUR FIRST MEAL FOREVER. You SHOULD INCREASE TO TWICE DAILY IF YOU ARE HAVING NAUSEA AND VOMITING AFTER YOU EAT.  STRICTLY AVOID ASPIRIN, BC/GOODY POWDERS, IBUPROFEN/MOTRIN, OR NAPROXEN/ALEVE FOR 2 WEEKS. USE TYLENOL AS NEEDED FOR PAIN.  FOLLOW A HIGH FIBER/LOW FAT DIET. AVOID ITEMS THAT CAUSE BLOATING. SEE INFO BELOW.   YOUR BIOPSY RESULTS SHOULD BE BACK IN 7 DAYS.  FOLLOW UP IN AUG 2014.  Next colonoscopy in 5 years.   ENDOSCOPY Care After Read the instructions outlined below and refer to this sheet in the next week. These discharge instructions provide you with general information on caring for yourself after you leave the hospital. While your treatment has been planned according to the most current medical practices available, unavoidable complications occasionally occur. If you have any problems or questions after discharge, call DR. Turner Baillie, [phone].  ACTIVITY  You may resume your regular activity, but move at a slower pace for the next 24 hours.   Take frequent rest periods for the next 24 hours.   Walking will help get rid of the air and reduce the bloated feeling in your belly (abdomen).   No driving for 24 hours (because of the medicine (anesthesia) used during the test).   You may shower.   Do not sign any important legal documents or operate any machinery for 24 hours (because of the anesthesia used during the test).    NUTRITION  Drink plenty of fluids.   You may resume your normal diet as instructed by your doctor.   Begin with a light meal and progress to your normal diet. Heavy or fried foods are harder to digest and  may make you feel sick to your stomach (nauseated).   Avoid alcoholic beverages for 24 hours or as instructed.    MEDICATIONS  You may resume your normal medications.   WHAT YOU CAN EXPECT TODAY  Some feelings of bloating in the abdomen.   Passage of more gas than usual.   Spotting of blood in your stool or on the toilet paper  .  IF YOU HAD POLYPS REMOVED DURING THE ENDOSCOPY:  Eat a soft diet IF YOU HAVE NAUSEA, BLOATING, ABDOMINAL PAIN, OR VOMITING.    FINDING OUT THE RESULTS OF YOUR TEST Not all test results are available during your visit. DR. Darrick Penna WILL CALL YOU WITHIN 7 DAYS OF YOUR PROCEDUE WITH YOUR RESULTS. Do not assume everything is normal if you have not heard from DR. Vaishali Baise IN ONE WEEK, CALL HER OFFICE AT [phone].  SEEK IMMEDIATE MEDICAL ATTENTION AND CALL THE OFFICE: [phone] IF:  You have more than a spotting of blood in your stool.   Your belly is swollen (abdominal distention).   You are nauseated or vomiting.   You have a temperature over 101F.   You have abdominal pain or discomfort that is severe or gets worse throughout the day.  Irritable Bowel Syndrome (Spastic Colon) Irritable Bowel Syndrome (IBS) is caused by a disturbance of normal bowel function. Other terms used are spastic colon, mucous colitis, and irritable colon. It does not require surgery, nor does it lead to cancer. There is no cure  for IBS. But with proper diet, stress reduction, and medication, you will find that your problems (symptoms) will gradually disappear or improve. IBS is a common digestive disorder. It usually appears in late adolescence or early adulthood. Women develop it twice as often as men.  CAUSES After food has been digested and absorbed in the small intestine, waste material is moved into the colon (large intestine). In the colon, water and salts are absorbed from the undigested products coming from the small intestine. The remaining residue, or fecal  material, is held for elimination. Under normal circumstances, gentle, rhythmic contractions on the bowel walls push the fecal material along the colon towards the rectum. In IBS, however, these contractions are irregular and poorly coordinated. The fecal material is either retained too long, resulting in constipation, or expelled too soon, producing diarrhea.  SYMPTOMS  The most common symptom of IBS is pain. It is typically in the lower left side of the belly (abdomen). But it may occur anywhere in the abdomen. It can be felt as heartburn, backache, or even as a dull pain in the arms or shoulders. The pain comes from excessive bowel-muscle spasms and from the buildup of gas and fecal material in the colon. This pain:  Can range from sharp belly (abdominal) cramps to a dull, continuous ache.   Usually worsens soon after eating.   Is typically relieved by having a bowel movement or passing gas.  Abdominal pain is usually accompanied by constipation. But it may also produce diarrhea. The diarrhea typically occurs right after a meal or upon arising in the morning. The stools are typically soft and watery. They are often flecked with secretions (mucus).  Other symptoms of IBS include:  Bloating.  Loss of appetite.   Heartburn.  Feeling sick to your stomach  (nausea).   Belching  Vomiting   Gas.  IBS may also cause a number of symptoms that are unrelated to the digestive system:  Fatigue.  Headaches.   Anxiety  Shortness of breath   Difficulty in concentrating.  Dizziness.   These symptoms tend to come and go.  TREATMENT A number of medications are available to help correct bowel function and/or relieve bowel spasms and abdominal pain. Among the drugs available are:  Mild, non-irritating laxatives for severe constipation and to help restore normal bowel habits.   Specific anti-diarrheal medications (BENTYL) to treat severe or prolonged diarrhea.   Anti-spasmodic agents to  relieve intestinal cramps.   HOME CARE INSTRUCTIONS   Avoid foods that are high in fat or oils. Some examples WUJ:[id number] cream, butter, frankfurters, sausage, and other fatty meats.   Avoid foods that have a laxative effect, such as fruit, fruit juice, and dairy products.   Cut out carbonated drinks, chewing gum, and "gassy" foods, such as beans and cabbage. This may help relieve bloating and belching.   Bran taken with plenty of liquids may help relieve constipation.   Keep track of what foods seem to trigger your symptoms.   Avoid emotionally charged situations or circumstances that produce anxiety.   Start or continue exercising.   Get plenty of rest and sleep.   Gastritis  Gastritis is an inflammation (the body's way of reacting to injury and/or infection) of the stomach. It is often caused by viral or bacterial (germ) infections. It can also be caused BY ASPIRIN, BC/GOODY POWDER'S, (IBUPROFEN) MOTRIN, OR ALEVE (NAPROXEN), chemicals (including alcohol), SPICY FOODS, and medications. This illness may be associated with generalized malaise (feeling tired, not well),  UPPER ABDOMINAL STOMACH cramps, NAUSEA/VOMITING, and fever. One common bacterial cause of gastritis is an organism known as H. Pylori. This can be treated with antibiotics.   High-Fiber Diet A high-fiber diet changes your normal diet to include more whole grains, legumes, fruits, and vegetables. Changes in the diet involve replacing refined carbohydrates with unrefined foods. The calorie level of the diet is essentially unchanged. The Dietary Reference Intake (recommended amount) for adult males is 38 grams per day. For adult females, it is 25 grams per day. Pregnant and lactating women should consume 28 grams of fiber per day. Fiber is the intact part of a plant that is not broken down during digestion. Functional fiber is fiber that has been isolated from the plant to provide a beneficial effect in the  body. PURPOSE  Increase stool bulk.   Ease and regulate bowel movements.   Lower cholesterol.  INDICATIONS THAT YOU NEED MORE FIBER  Constipation and hemorrhoids.   Uncomplicated diverticulosis (intestine condition) and irritable bowel syndrome.   Weight management.   As a protective measure against hardening of the arteries (atherosclerosis), diabetes, and cancer.   GUIDELINES FOR INCREASING FIBER IN THE DIET  Start adding fiber to the diet slowly. A gradual increase of about 5 more grams (2 slices of whole-wheat bread, 2 servings of most fruits or vegetables, or 1 bowl of high-fiber cereal) per day is best. Too rapid an increase in fiber may result in constipation, flatulence, and bloating.   Drink enough water and fluids to keep your urine clear or pale yellow. Water, juice, or caffeine-free drinks are recommended. Not drinking enough fluid may cause constipation.   Eat a variety of high-fiber foods rather than one type of fiber.   Try to increase your intake of fiber through using high-fiber foods rather than fiber pills or supplements that contain small amounts of fiber.   The goal is to change the types of food eaten. Do not supplement your present diet with high-fiber foods, but replace foods in your present diet.  INCLUDE A VARIETY OF FIBER SOURCES  Replace refined and processed grains with whole grains, canned fruits with fresh fruits, and incorporate other fiber sources. White rice, white breads, and most bakery goods contain little or no fiber.   Brown whole-grain rice, buckwheat oats, and many fruits and vegetables are all good sources of fiber. These include: broccoli, Brussels sprouts, cabbage, cauliflower, beets, sweet potatoes, white potatoes (skin on), carrots, tomatoes, eggplant, squash, berries, fresh fruits, and dried fruits.   Cereals appear to be the richest source of fiber. Cereal fiber is found in whole grains and bran. Bran is the fiber-rich outer coat of  cereal grain, which is largely removed in refining. In whole-grain cereals, the bran remains. In breakfast cereals, the largest amount of fiber is found in those with "bran" in their names. The fiber content is sometimes indicated on the label.   You may need to include additional fruits and vegetables each day.   In baking, for 1 cup white flour, you may use the following substitutions:   1 cup whole-wheat flour minus 2 tablespoons.   1/2 cup white flour plus 1/2 cup whole-wheat flour.    Low-Fat Diet BREADS, CEREALS, PASTA, RICE, DRIED PEAS, AND BEANS These products are high in carbohydrates and most are low in fat. Therefore, they can be increased in the diet as substitutes for fatty foods. They too, however, contain calories and should not be eaten in excess. Cereals can be eaten for  snacks as well as for breakfast.  Include foods that contain fiber (fruits, vegetables, whole grains, and legumes). Research shows that fiber may lower blood cholesterol levels, especially the water-soluble fiber found in fruits, vegetables, oat products, and legumes. FRUITS AND VEGETABLES It is good to eat fruits and vegetables. Besides being sources of fiber, both are rich in vitamins and some minerals. They help you get the daily allowances of these nutrients. Fruits and vegetables can be used for snacks and desserts. MEATS Limit lean meat, chicken, [place], and fish to no more than 6 ounces per day. Beef, Pork, and Lamb Use lean cuts of beef, pork, and lamb. Lean cuts include:  Extra-lean ground beef.  Arm roast.  Sirloin tip.  Center-cut ham.  Round steak.  Loin chops.  Rump roast.  Tenderloin.  Trim all fat off the outside of meats before cooking. It is not necessary to severely decrease the intake of red meat, but lean choices should be made. Lean meat is rich in protein and contains a highly absorbable form of iron. Premenopausal women, in particular, should avoid reducing lean red meat because  this could increase the risk for low red blood cells (iron-deficiency anemia).  Chicken and [place] These are good sources of protein. The fat of poultry can be reduced by removing the skin and underlying fat layers before cooking. Chicken and [place] can be substituted for lean red meat in the diet. Poultry should not be fried or covered with high-fat sauces. Fish and Shellfish Fish is a good source of protein. Shellfish contain cholesterol, but they usually are low in saturated fatty acids. The preparation of fish is important. Like chicken and [place], they should not be fried or covered with high-fat sauces. EGGS Egg whites contain no fat or cholesterol. They can be eaten often. Try 1 to 2 egg whites instead of whole eggs in recipes or use egg substitutes that do not contain yolk.  MILK AND DAIRY PRODUCTS Use skim or 1% milk instead of 2% or whole milk. Decrease whole milk, natural, and processed cheeses. Use nonfat or low-fat (2%) cottage cheese or low-fat cheeses made from vegetable oils. Choose nonfat or low-fat (1 to 2%) yogurt. Experiment with evaporated skim milk in recipes that call for heavy cream. Substitute low-fat yogurt or low-fat cottage cheese for sour cream in dips and salad dressings. Have at least 2 servings of low-fat dairy products, such as 2 glasses of skim (or 1%) milk each day to help get your daily calcium intake.  FATS AND OILS Butterfat, lard, and beef fats are high in saturated fat and cholesterol. These should be avoided.Vegetable fats do not contain cholesterol. AVOID coconut oil, palm oil, and palm kernel oil, WHICH are very high in saturated fats. These should be limited. These fats are often used in bakery goods, processed foods, popcorn, oils, and nondairy creamers. Vegetable shortenings and some peanut butters contain hydrogenated oils, which are also saturated fats. Read the labels on these foods and check for saturated vegetable oils.  Desirable liquid vegetable  oils are corn oil, cottonseed oil, olive oil, canola oil, safflower oil, soybean oil, and sunflower oil. Peanut oil is not as good, but small amounts are acceptable. Buy a heart-healthy tub margarine that has no partially hydrogenated oils in the ingredients. AVOID Mayonnaise and salad dressings often are made from unsaturated fats.  OTHER EATING TIPS Snacks  Most sweets should be limited as snacks. They tend to be rich in calories and fats, and their caloric content  outweighs their nutritional value. Some good choices in snacks are graham crackers, melba toast, soda crackers, bagels (no egg), English muffins, fruits, and vegetables. These snacks are preferable to snack crackers, [place] fries, and chips. Popcorn should be air-popped or cooked in small amounts of liquid vegetable oil.  Desserts Eat fruit, low-fat yogurt, and fruit ices instead of pastries, cake, and cookies. Sherbet, angel food cake, gelatin dessert, frozen low-fat yogurt, or other frozen products that do not contain saturated fat (pure fruit juice bars, frozen ice pops) are also acceptable.   COOKING METHODS Choose those methods that use little or no fat. They include: Poaching.  Braising.  Steaming.  Grilling.  Baking.  Stir-frying.  Broiling.  Microwaving.  Foods can be cooked in a nonstick pan without added fat, or use a nonfat cooking spray in regular cookware. Limit fried foods and avoid frying in saturated fat. Add moisture to lean meats by using water, broth, cooking wines, and other nonfat or low-fat sauces along with the cooking methods mentioned above. Soups and stews should be chilled after cooking. The fat that forms on top after a few hours in the refrigerator should be skimmed off. When preparing meals, avoid using excess salt. Salt can contribute to raising blood pressure in some people.  EATING AWAY FROM HOME Order entres, potatoes, and vegetables without sauces or butter. When meat exceeds the size of a deck  of cards (3 to 4 ounces), the rest can be taken home for another meal. Choose vegetable or fruit salads and ask for low-calorie salad dressings to be served on the side. Use dressings sparingly. Limit high-fat toppings, such as bacon, crumbled eggs, cheese, sunflower seeds, and olives. Ask for heart-healthy tub margarine instead of butter.   Hemorrhoids Hemorrhoids are dilated (enlarged) veins around the rectum. Sometimes clots will form in the veins. This makes them swollen and painful. These are called thrombosed hemorrhoids. Causes of hemorrhoids include:  Constipation.   Straining to have a bowel movement.   HEAVY LIFTING HOME CARE INSTRUCTIONS  Eat a well balanced diet and drink 6 to 8 glasses of water every day to avoid constipation. You may also use a bulk laxative.   Avoid straining to have bowel movements.   Keep anal area dry and clean.   Do not use a donut shaped pillow or sit on the toilet for long periods. This increases blood pooling and pain.   Move your bowels when your body has the urge; this will require less straining and will decrease pain and pressure.

## 2012-07-22 NOTE — Anesthesia Procedure Notes (Signed)
Procedure Name: MAC Date/Time: 07/22/2012 8:40 AM Performed by: Franco Nones Pre-anesthesia Checklist: Patient identified, Emergency Drugs available, Suction available, Timeout performed and Patient being monitored Patient Re-evaluated:Patient Re-evaluated prior to inductionOxygen Delivery Method: Non-rebreather mask

## 2012-07-22 NOTE — Progress Notes (Signed)
From OR. Awake. Talking. Swallowing without difficulty. 

## 2012-07-22 NOTE — Anesthesia Preprocedure Evaluation (Signed)
Anesthesia Evaluation  Patient identified by MRN, date of birth, ID band Patient awake    Reviewed: Allergy & Precautions, H&P , NPO status , Patient's Chart, lab work & pertinent test results  Airway Mallampati: II TM Distance: >3 FB Neck ROM: Full    Dental  (+) Poor Dentition, Missing, Chipped and Dental Advisory Given   Pulmonary sleep apnea , COPD breath sounds clear to auscultation        Cardiovascular hypertension, Pt. on medications + Peripheral Vascular Disease Rhythm:Regular Rate:Normal     Neuro/Psych Seizures -, Poorly Controlled,  PSYCHIATRIC DISORDERS Anxiety Depression CVA    GI/Hepatic GERD-  ,(+)     substance abuse  alcohol use,   Endo/Other    Renal/GU Renal disease     Musculoskeletal   Abdominal   Peds  Hematology   Anesthesia Other Findings   Reproductive/Obstetrics                           Anesthesia Physical Anesthesia Plan  ASA: III  Anesthesia Plan: MAC   Post-op Pain Management:    Induction: Intravenous  Airway Management Planned: Simple Face Mask  Additional Equipment:   Intra-op Plan:   Post-operative Plan:   Informed Consent: I have reviewed the patients History and Physical, chart, labs and discussed the procedure including the risks, benefits and alternatives for the proposed anesthesia with the patient or authorized representative who has indicated his/her understanding and acceptance.     Plan Discussed with:   Anesthesia Plan Comments:         Anesthesia Quick Evaluation

## 2012-07-22 NOTE — Progress Notes (Signed)
Adam Wheeler Fentanyl that was remaining from the that was pulled in pre-op.(I administered Fentanyl in pre-op.)

## 2012-07-22 NOTE — Progress Notes (Signed)
Encouraged to pass air. Voiced understanding.

## 2012-07-22 NOTE — Transfer of Care (Signed)
Immediate Anesthesia Transfer of Care Note  Patient: Adam Wheeler  Procedure(s) Performed: Procedure(s) (LRB): COLONOSCOPY WITH PROPOFOL (N/A) ESOPHAGOGASTRODUODENOSCOPY (EGD) WITH PROPOFOL (N/A) SAVORY DILATION (N/A) BIOPSY (N/A)  Patient Location: PACU  Anesthesia Type: MAC  Level of Consciousness: awake  Airway & Oxygen Therapy: Patient Spontanous Breathing.   Post-op Assessment: Report given to PACU RN, Post -op Vital signs reviewed and stable and Patient moving all extremities  Post vital signs: Reviewed and stable  Complications: No apparent anesthesia complications

## 2012-07-23 NOTE — Op Note (Signed)
[facility] [address] [place], [address]   ENDOSCOPY PROCEDURE REPORT  PATIENT: Adam Wheeler, Adam Wheeler  MR#: [id number] BIRTHDATE: [birth year] , 57  yrs. old GENDER: Male  ENDOSCOPIST: Jonette Eva, MD REFFERED [id number] Jorene Guest, M.D.  PROCEDURE DATE:  07/22/2012 PROCEDURE:   EGD with biopsy and EGD with dilatation over guidewire   INDICATIONS:1.  dysphagia.   2.  diarrhea. MEDICATIONS: MAC sedation, administered by CRNA TOPICAL ANESTHETIC: Cetacaine Spray  DESCRIPTION OF PROCEDURE:   After the risks benefits and alternatives of the procedure were thoroughly explained, informed consent was obtained.  The     endoscope was introduced through the mouth and advanced to the second portion of the duodenum. The instrument was slowly withdrawn as the mucosa was carefully examined.  Prior to withdrawal of the scope, the guidwire was placed.  The esophagus was dilated successfully.  The patient was recovered in endoscopy and discharged home in satisfactory condition.    ESOPHAGUS: A stricture was found at the gastroesophageal junction. The stenosis was traversable with the endoscope.   STOMACH: Moderate non-erosive gastritis (inflammation) was found in the gastric antrum.  Multiple biopsies were performed.   DUODENUM: The duodenal mucosa showed no abnormalities in the duodenal bulb. SCALLOPING NOTED ON FOODS. BIOPSIES OBTAINED TO EVALUATE FOR CELIAC SPRUE.  Dilation was then performed at the gastroesphageal junction Dilator: Savary over guidewire Size(s): 15-17 mm Resistance: minimal Heme: yes  COMPLICATIONS: There were no complications.  ENDOSCOPIC IMPRESSION: 1.   Stricture was found at the gastroesophageal junction: DEFINITE SOURCE FOR DYSPHAGIA 2.   MODERATE Non-erosive gastritis 3.   MILD DUODENITIS 4.  NO OBVIOUS SOURCE FOR DIARRHEA IDENTIFIED  RECOMMENDATIONS: AWAIT BIOPSY LOW FAT/LACTOSE FREE/HIGH FIBER DIET NEXIUM 30 MINS PRIOR TO FIRST MEAL  FOREVER OPV AUG 2014 IF DYSPHAGIA CONTINUES CONSIDER BPE AND/OR REPEAT EGD/DIL      _______________________________ Rosalie DoctorJonette Eva, MD 07/22/2012 9:36 AM      PATIENT NAME:  [name], [name] MR#: [id number]

## 2012-07-24 ENCOUNTER — Encounter (HOSPITAL_COMMUNITY): Payer: Self-pay | Admitting: Gastroenterology

## 2012-07-24 ENCOUNTER — Telehealth: Payer: Self-pay | Admitting: Gastroenterology

## 2012-07-24 NOTE — Telephone Encounter (Signed)
Please call pt. His stomach Bx shows gastritis FROM IBUPROFEN. His colon and small bowel biopsies are normal. HIS DIARRHEA IS DUE TO IBS.   CONTINUE NEXIUM. Increase to twice daily if you are having nausea/vomiting after eating. FOLLOW A LOW FAT / HIGH FIBER DIET.  FOLLOW UP IN AUG 2014 NEXT TCS IN 5 YEARS.

## 2012-07-24 NOTE — Telephone Encounter (Signed)
Called and informed pt.  

## 2012-07-24 NOTE — Telephone Encounter (Signed)
Cc PCP 

## 2012-07-24 NOTE — Telephone Encounter (Signed)
Reminder in epic °

## 2012-07-25 NOTE — Progress Notes (Signed)
Quick Note:  All stools negative. ______

## 2012-10-13 DIAGNOSIS — H905 Unspecified sensorineural hearing loss: Secondary | ICD-10-CM | POA: Insufficient documentation

## 2013-03-01 DIAGNOSIS — R51 Headache: Secondary | ICD-10-CM | POA: Insufficient documentation

## 2013-03-01 DIAGNOSIS — R519 Headache, unspecified: Secondary | ICD-10-CM | POA: Insufficient documentation

## 2013-07-01 ENCOUNTER — Emergency Department (HOSPITAL_COMMUNITY)
Admission: EM | Admit: 2013-07-01 | Discharge: 2013-07-01 | Disposition: A | Discharge status: Home / Self Care | Payer: Medicare Other | Attending: Emergency Medicine | Admitting: Emergency Medicine

## 2013-07-01 ENCOUNTER — Emergency Department (HOSPITAL_COMMUNITY): Payer: Medicare Other

## 2013-07-01 ENCOUNTER — Encounter (HOSPITAL_COMMUNITY): Payer: Self-pay | Admitting: Emergency Medicine

## 2013-07-01 DIAGNOSIS — F3289 Other specified depressive episodes: Secondary | ICD-10-CM | POA: Insufficient documentation

## 2013-07-01 DIAGNOSIS — Z8781 Personal history of (healed) traumatic fracture: Secondary | ICD-10-CM | POA: Insufficient documentation

## 2013-07-01 DIAGNOSIS — M129 Arthropathy, unspecified: Secondary | ICD-10-CM | POA: Insufficient documentation

## 2013-07-01 DIAGNOSIS — G40909 Epilepsy, unspecified, not intractable, without status epilepticus: Secondary | ICD-10-CM | POA: Insufficient documentation

## 2013-07-01 DIAGNOSIS — F329 Major depressive disorder, single episode, unspecified: Secondary | ICD-10-CM | POA: Insufficient documentation

## 2013-07-01 DIAGNOSIS — F32A Depression, unspecified: Secondary | ICD-10-CM

## 2013-07-01 DIAGNOSIS — J449 Chronic obstructive pulmonary disease, unspecified: Secondary | ICD-10-CM | POA: Insufficient documentation

## 2013-07-01 DIAGNOSIS — Z860100 Personal history of colon polyps, unspecified: Secondary | ICD-10-CM | POA: Insufficient documentation

## 2013-07-01 DIAGNOSIS — F17200 Nicotine dependence, unspecified, uncomplicated: Secondary | ICD-10-CM | POA: Insufficient documentation

## 2013-07-01 DIAGNOSIS — F1210 Cannabis abuse, uncomplicated: Secondary | ICD-10-CM | POA: Insufficient documentation

## 2013-07-01 DIAGNOSIS — Z8673 Personal history of transient ischemic attack (TIA), and cerebral infarction without residual deficits: Secondary | ICD-10-CM | POA: Insufficient documentation

## 2013-07-01 DIAGNOSIS — I10 Essential (primary) hypertension: Secondary | ICD-10-CM | POA: Insufficient documentation

## 2013-07-01 DIAGNOSIS — J4489 Other specified chronic obstructive pulmonary disease: Secondary | ICD-10-CM | POA: Insufficient documentation

## 2013-07-01 DIAGNOSIS — Z86718 Personal history of other venous thrombosis and embolism: Secondary | ICD-10-CM | POA: Insufficient documentation

## 2013-07-01 DIAGNOSIS — Z79899 Other long term (current) drug therapy: Secondary | ICD-10-CM | POA: Insufficient documentation

## 2013-07-01 DIAGNOSIS — F1310 Sedative, hypnotic or anxiolytic abuse, uncomplicated: Secondary | ICD-10-CM | POA: Insufficient documentation

## 2013-07-01 DIAGNOSIS — Z87442 Personal history of urinary calculi: Secondary | ICD-10-CM | POA: Insufficient documentation

## 2013-07-01 DIAGNOSIS — IMO0002 Reserved for concepts with insufficient information to code with codable children: Secondary | ICD-10-CM | POA: Insufficient documentation

## 2013-07-01 LAB — CBC WITH DIFFERENTIAL/PLATELET
Basophils Absolute: 0.1 10*3/uL (ref 0.0–0.1)
Basophils Relative: 0 % (ref 0–1)
Eosinophils Absolute: 0.2 10*3/uL (ref 0.0–0.7)
Eosinophils Relative: 1 % (ref 0–5)
HCT: 44.6 % (ref 39.0–52.0)
Hemoglobin: 15.1 g/dL (ref 13.0–17.0)
Lymphocytes Relative: 12 % (ref 12–46)
Lymphs Abs: 1.9 10*3/uL (ref 0.7–4.0)
MCH: 33.6 pg (ref 26.0–34.0)
MCHC: 33.9 g/dL (ref 30.0–36.0)
MCV: 99.3 fL (ref 78.0–100.0)
Monocytes Absolute: 1.1 10*3/uL — ABNORMAL HIGH (ref 0.1–1.0)
Monocytes Relative: 7 % (ref 3–12)
Neutro Abs: 12.6 10*3/uL — ABNORMAL HIGH (ref 1.7–7.7)
Neutrophils Relative %: 80 % — ABNORMAL HIGH (ref 43–77)
Platelets: 288 10*3/uL (ref 150–400)
RBC: 4.49 MIL/uL (ref 4.22–5.81)
RDW: 14.3 % (ref 11.5–15.5)
WBC: 15.8 10*3/uL — ABNORMAL HIGH (ref 4.0–10.5)

## 2013-07-01 LAB — BASIC METABOLIC PANEL
BUN: 14 mg/dL (ref 6–23)
CO2: 27 mEq/L (ref 19–32)
Calcium: 9.5 mg/dL (ref 8.4–10.5)
Chloride: 97 mEq/L (ref 96–112)
Creatinine, Ser: 1.38 mg/dL — ABNORMAL HIGH (ref 0.50–1.35)
GFR calc Af Amer: 65 mL/min — ABNORMAL LOW (ref >90)
GFR calc non Af Amer: 56 mL/min — ABNORMAL LOW (ref >90)
Glucose, Bld: 93 mg/dL (ref 70–99)
Potassium: 4.2 mEq/L (ref 3.7–5.3)
Sodium: 136 mEq/L — ABNORMAL LOW (ref 137–147)

## 2013-07-01 LAB — RAPID URINE DRUG SCREEN, HOSP PERFORMED
Amphetamines: NOT DETECTED
Barbiturates: NOT DETECTED
Benzodiazepines: POSITIVE — AB
Cocaine: NOT DETECTED
Opiates: NOT DETECTED
Tetrahydrocannabinol: POSITIVE — AB

## 2013-07-01 LAB — ETHANOL: Alcohol, Ethyl (B): <11 mg/dL (ref 0–11)

## 2013-07-01 NOTE — ED Provider Notes (Signed)
CSN: [id number]     Arrival date & time 07/01/13  1340 History   First MD Initiated Contact with Patient 07/01/13 1510     Chief Complaint  Patient presents with  . Hand Pain     (Consider location/radiation/quality/duration/timing/severity/associated sxs/prior Treatment) HPI.... patient has been depressed since Sunday. He is sleeping a lot, not eating, poor concentration. He has suffered from depression in the past but not this bad. He is not suicidal or homicidal. Severity is moderate. Nothing makes symptoms better or worse. He has never been hospitalized for psychiatric reasons   Past Medical History  Diagnosis Date  . Anxiety   . Colon polyps     adenomatous polyps on multiple colonoscopies, starting in his early 56s  . Stroke 2005    post knee surgery in 2005  . COPD (chronic obstructive pulmonary disease)   . Ankle fracture, right 06/2012  . Kidney stones   . Depression   . Hypertension   . Sleep apnea   . Arthritis   . Seizures     has seizures weekly  . History of DVT (deep vein thrombosis)    Past Surgical History  Procedure Laterality Date  . Colonoscopy  06/08/2004    Dr. Rehman:Small polyp cold snared from transverse colon/ Small external hemorrhoids  . Colonoscopy  06/16/09    [facility]:WCBJS internal hemorrhoids/simple adenomas (four polyps removed). Random colon biopsies negative for microscopic colitis. Screening colonoscopy in 5 years with propofol recommended by Dr. Oneida Alar.  . Esophagogastroduodenoscopy  06/16/09    SLF: distal peptic stricture, mild erythema, esophagus dilated to 16 mm. Patchy erythema in the antrum with occasional erosion with active oozing. Patchy erythema in the duodenal bulb. Small bowel biopsies benign. Minimal chronic gastritis with no H. pylori.  . Left knee surgery  2005  . Colonoscopy with propofol N/A 07/22/2012    Procedure: COLONOSCOPY WITH PROPOFOL;  Surgeon: Danie Binder, MD;  Location: [facility];  Service: Endoscopy;  Laterality: N/A;  in  cecum at 0853, out at 0901total time = 8 minutes  . Esophagogastroduodenoscopy (egd) with propofol N/A 07/22/2012    Procedure: ESOPHAGOGASTRODUODENOSCOPY (EGD) WITH PROPOFOL;  Surgeon: Danie Binder, MD;  Location: [facility];  Service: Endoscopy;  Laterality: N/A;  . Savory dilation N/A 07/22/2012    Procedure: SAVORY DILATION;  Surgeon: Danie Binder, MD;  Location: [facility];  Service: Endoscopy;  Laterality: N/A;  15, 16, 17  . Esophageal biopsy N/A 07/22/2012    Procedure: BIOPSY;  Surgeon: Danie Binder, MD;  Location: [facility];  Service: Endoscopy;  Laterality: N/A;   Family History  Problem Relation Age of Onset  . Cancer Father     deceased age 71, metastatic cancer ?stomach  . Colon cancer Mother     diagnosed in her 16s, deceased at 62  . Colon cancer Other     2 maternal uncles and one first cousin  . Breast cancer Mother    History  Substance Use Topics  . Smoking status: Current Every Day Smoker -- 0.25 packs/day for 40 years    Types: Cigarettes  . Smokeless tobacco: Never Used  . Alcohol Use: No    Review of Systems  All other systems reviewed and are negative.      Allergies  Review of patient's allergies indicates no known allergies.  Home Medications   Current Outpatient Rx  Name  Route  Sig  Dispense  Refill  . ALPRAZolam (XANAX) 1 MG tablet   Oral  Take 1 mg by mouth 4 (four) times daily.          Marland Kitchen FLUoxetine (PROZAC) 40 MG capsule   Oral   Take 40 mg by mouth every morning.         . gabapentin (NEURONTIN) 300 MG capsule   Oral   Take 300 mg by mouth 2 (two) times daily.         Marland Kitchen lisinopril (PRINIVIL,ZESTRIL) 20 MG tablet   Oral   Take 20 mg by mouth daily.         Marland Kitchen NEXIUM 40 MG capsule   Oral   Take 80 mg by mouth daily.          . predniSONE (STERAPRED UNI-PAK) 10 MG tablet   Oral   Take 10 tablets by mouth daily.          BP 100/56  Pulse 61  Temp(Src) 97.5 F (36.4 C) (Oral)  Resp 18  Ht 5\' 10"  (1.778 m)  Wt 149 lb  (67.586 kg)  BMI 21.38 kg/m2  SpO2 97% Physical Exam  Nursing note and vitals reviewed. Constitutional: He is oriented to person, place, and time. He appears well-developed and well-nourished.  HENT:  Head: Normocephalic and atraumatic.  Eyes: Conjunctivae and EOM are normal. Pupils are equal, round, and reactive to light.  Neck: Normal range of motion. Neck supple.  Cardiovascular: Normal rate, regular rhythm and normal heart sounds.   Pulmonary/Chest: Effort normal and breath sounds normal.  Abdominal: Soft. Bowel sounds are normal.  Musculoskeletal: Normal range of motion.  Neurological: He is alert and oriented to person, place, and time.  Skin: Skin is warm and dry.  Psychiatric:  Flat affect, depressed    ED Course  Procedures (including critical care time) Labs Review Labs Reviewed  CBC WITH DIFFERENTIAL - Abnormal; Notable for the following:    WBC 15.8 (*)    Neutrophils Relative % 80 (*)    Neutro Abs 12.6 (*)    Monocytes Absolute 1.1 (*)    All other components within normal limits  BASIC METABOLIC PANEL - Abnormal; Notable for the following:    Sodium 136 (*)    Creatinine, Ser 1.38 (*)    GFR calc non Af Amer 56 (*)    GFR calc Af Amer 65 (*)    All other components within normal limits  URINE RAPID DRUG SCREEN (HOSP PERFORMED) - Abnormal; Notable for the following:    Benzodiazepines POSITIVE (*)    Tetrahydrocannabinol POSITIVE (*)    All other components within normal limits  ETHANOL   Imaging Review No results found.   EKG Interpretation   Date/Time:  Wednesday July 01 2013 14:33:44 EDT Ventricular Rate:  72 PR Interval:  118 QRS Duration: 90 QT Interval:  390 QTC Calculation: 427 R Axis:   92 Text Interpretation:  Normal sinus rhythm Rightward axis ST elevation,  consider early repolarization, pericarditis, or injury Abnormal ECG When  compared with ECG of 18-Jul-2012 09:45, No significant change was found  Confirmed by Lacinda Axon  MD, Damarys Speir  [phone]) on 07/01/2013 5:00:28 PM      MDM   Final diagnoses:  Depression    I attempted to arrange a behavioral health consult. Patient decided to leave prior to consult. He is not suicidal, homicidal, psychotic. He will followup with a Dreama Saa, MD 07/01/13 [phone]

## 2013-07-01 NOTE — Discharge Instructions (Signed)
Follow-up with community mental health resources °

## 2013-07-01 NOTE — ED Notes (Addendum)
Pt reports has not eaten since SUnday.  Pt reports has had depression.  Denies any SI or HI.  Pt says just feels like laying down and sleeping through all of his problems.   Pt says has a lot of stress right now and doesn't know how to deal with it.  Pt says he is [place] and he does not want to kill himself.   Reports bp today was 75/54 at [facility].

## 2013-07-01 NOTE — ED Notes (Signed)
Pt wanded by security. 

## 2013-11-22 ENCOUNTER — Encounter (HOSPITAL_COMMUNITY): Payer: Self-pay | Admitting: Emergency Medicine

## 2013-11-22 ENCOUNTER — Emergency Department (HOSPITAL_COMMUNITY)
Admission: EM | Admit: 2013-11-22 | Discharge: 2013-11-22 | Disposition: A | Discharge status: Home / Self Care | Payer: Medicare Other | Attending: Emergency Medicine | Admitting: Emergency Medicine

## 2013-11-22 ENCOUNTER — Emergency Department (HOSPITAL_COMMUNITY): Payer: Medicare Other

## 2013-11-22 DIAGNOSIS — F3289 Other specified depressive episodes: Secondary | ICD-10-CM | POA: Diagnosis not present

## 2013-11-22 DIAGNOSIS — F17200 Nicotine dependence, unspecified, uncomplicated: Secondary | ICD-10-CM | POA: Insufficient documentation

## 2013-11-22 DIAGNOSIS — Z86718 Personal history of other venous thrombosis and embolism: Secondary | ICD-10-CM | POA: Diagnosis not present

## 2013-11-22 DIAGNOSIS — Z8673 Personal history of transient ischemic attack (TIA), and cerebral infarction without residual deficits: Secondary | ICD-10-CM | POA: Diagnosis not present

## 2013-11-22 DIAGNOSIS — S99929A Unspecified injury of unspecified foot, initial encounter: Secondary | ICD-10-CM

## 2013-11-22 DIAGNOSIS — S82009A Unspecified fracture of unspecified patella, initial encounter for closed fracture: Secondary | ICD-10-CM | POA: Insufficient documentation

## 2013-11-22 DIAGNOSIS — J4489 Other specified chronic obstructive pulmonary disease: Secondary | ICD-10-CM | POA: Insufficient documentation

## 2013-11-22 DIAGNOSIS — Y9389 Activity, other specified: Secondary | ICD-10-CM | POA: Insufficient documentation

## 2013-11-22 DIAGNOSIS — J449 Chronic obstructive pulmonary disease, unspecified: Secondary | ICD-10-CM | POA: Insufficient documentation

## 2013-11-22 DIAGNOSIS — Z79899 Other long term (current) drug therapy: Secondary | ICD-10-CM | POA: Insufficient documentation

## 2013-11-22 DIAGNOSIS — M129 Arthropathy, unspecified: Secondary | ICD-10-CM | POA: Insufficient documentation

## 2013-11-22 DIAGNOSIS — F329 Major depressive disorder, single episode, unspecified: Secondary | ICD-10-CM | POA: Insufficient documentation

## 2013-11-22 DIAGNOSIS — S99919A Unspecified injury of unspecified ankle, initial encounter: Secondary | ICD-10-CM

## 2013-11-22 DIAGNOSIS — W010XXA Fall on same level from slipping, tripping and stumbling without subsequent striking against object, initial encounter: Secondary | ICD-10-CM | POA: Insufficient documentation

## 2013-11-22 DIAGNOSIS — S8990XA Unspecified injury of unspecified lower leg, initial encounter: Secondary | ICD-10-CM | POA: Diagnosis present

## 2013-11-22 DIAGNOSIS — Z860100 Personal history of colon polyps, unspecified: Secondary | ICD-10-CM | POA: Insufficient documentation

## 2013-11-22 DIAGNOSIS — Z8781 Personal history of (healed) traumatic fracture: Secondary | ICD-10-CM | POA: Insufficient documentation

## 2013-11-22 DIAGNOSIS — Z87442 Personal history of urinary calculi: Secondary | ICD-10-CM | POA: Diagnosis not present

## 2013-11-22 DIAGNOSIS — Z8669 Personal history of other diseases of the nervous system and sense organs: Secondary | ICD-10-CM | POA: Insufficient documentation

## 2013-11-22 DIAGNOSIS — I10 Essential (primary) hypertension: Secondary | ICD-10-CM | POA: Diagnosis not present

## 2013-11-22 DIAGNOSIS — Y929 Unspecified place or not applicable: Secondary | ICD-10-CM | POA: Insufficient documentation

## 2013-11-22 DIAGNOSIS — S82002A Unspecified fracture of left patella, initial encounter for closed fracture: Secondary | ICD-10-CM

## 2013-11-22 MED ORDER — HYDROMORPHONE HCL PF 1 MG/ML IJ SOLN
1.0000 mg | Freq: Once | INTRAMUSCULAR | Status: AC
  Administered 2013-11-22: 1 mg via INTRAMUSCULAR
  Filled 2013-11-22: qty 1

## 2013-11-22 MED ORDER — OXYCODONE-ACETAMINOPHEN 5-325 MG PO TABS: 1.0000 | ORAL_TABLET | ORAL | Status: DC | PRN

## 2013-11-22 MED ORDER — ONDANSETRON 8 MG PO TBDP
8.0000 mg | ORAL_TABLET | Freq: Once | ORAL | Status: AC
  Administered 2013-11-22: 8 mg via ORAL
  Filled 2013-11-22: qty 1

## 2013-11-22 NOTE — ED Notes (Signed)
Pt c/o left knee pain that started last night after a fall. 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

## 2013-11-22 NOTE — Discharge Instructions (Signed)
Knee Fracture, Adult A knee fracture is a break in any of the bones of the lower part of the thigh bone, the upper part of the bones of the lower leg, or of the kneecap. When the bones no longer meet the way they are supposed to it is called a dislocation. Sometimes there can be a dislocation along with fractures. SYMPTOMS  Symptoms may include pain, swelling, inability to bend the knee, deformity of the knee, and inability to walk.  DIAGNOSIS  This problem is usually diagnosed with x-rays. Special studies are sometimes done if a fracture is suspected but cannot be seen on ordinary x-rays. If vessels around the knee are injured, special tests may be done to see what the damage is. TREATMENT   The leg is usually splinted for the first couple of days to allow for swelling. After the swelling is down a cast is put on. Sometimes a cast is put on right away with the sides of the cast cut to allow the knee to swell. If the bones are in place, this may be all that is needed.  If the bones are out of place, medications for pain are given to allow them to be put back in place. If they are seriously out of place, surgery may be needed to hold the pieces or breaks in place using wires, pins, screws or metal plates.  Generally most fractures will heal in 4 to 6 weeks. HOME CARE INSTRUCTIONS   Use your crutches as directed.  To lessen the swelling, keep the injured leg elevated while sitting or lying down.  Apply ice to the injury for 15-20 minutes, 03-04 times per day while awake for 2 days. Put the ice in a plastic bag and place a thin towel between the bag of ice and your cast.  If you have a plaster or fiberglass cast:  Do not try to scratch the skin under the cast using sharp or pointed objects.  Check the skin around the cast every day. You may put lotion on any red or sore areas.  Keep your cast dry and clean.  If you have a plaster splint:  Wear the splint as directed.  You may loosen the  elastic around the splint if your toes become numb, tingle, or turn cold or blue.  Do not put pressure on any part of your cast or splint; it may break. Rest your cast only on a pillow the first 24 hours until it is fully hardened.  Your cast or splint can be protected during bathing with a plastic bag. Do not lower the cast or splint into water.  Only take over-the-counter or prescription medicines for pain, discomfort, or fever as directed by your caregiver.  See your caregiver soon if your cast gets damaged or breaks.  It is very important to keep all follow up appointments. Not following up as directed may result in a worsening of your condition or a failure of the fracture to heal properly. SEEK IMMEDIATE MEDICAL CARE IF:  You have continued severe pain.  You have more swelling than you did before the cast was put on.  The area below the fracture becomes painful.  Your skin or toenails below the injury turn blue or gray, or feel cold or numb.  There is drainage coming from under the cast.  New, unexplained symptoms develop (drugs used in treatment may produce side effects). MAKE SURE YOU:   Understand these instructions.  Will watch your condition.  Will   get help right away if you are not doing well or get worse. Document Released: 02/13/2006 Document Revised: 06/25/2011 Document Reviewed: 03/17/2007 [facility] Patient Information 2015 [place], [place]. This information is not intended to replace advice given to you by your health care provider. Make sure you discuss any questions you have with your health care provider.   Wear your knee immobilizer and use crutches at all times to avoid stressing your knee injury. Call your orthopedist for further management of your injury.  Ice and elevation will help with pain and swelling.  You may take the oxycodone prescribed for pain relief.  This will make you drowsy - do not drive within 4 hours of taking this medication. Your blood  pressure is elevated today.  You should have this rechecked once your pain is better.

## 2013-11-22 NOTE — ED Provider Notes (Signed)
CSN: [id number]     Arrival date & time 11/22/13  1110 History  This chart was scribed for non-physician practitioner, Evalee Jefferson, PA-C,working with Nat Christen, MD, by Marlowe Kays, ED Scribe. This patient was seen in room APFT20/APFT20 and the patient's care was started at 12:23 PM.  Chief Complaint  Patient presents with  . Knee Pain   Patient is a 58 y.o. male presenting with knee pain. The history is provided by the patient. No language interpreter was used.  Knee Pain Associated symptoms: no fever    HPI Comments:  Adam Wheeler is a 58 y.o. 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 He reports associated left knee swelling last night that has since resolved. He states the left ankle was swollen but is no longer swollen or tender. He reports that he has been icing the knee but denies taking anything for pain. He denies any allergies to any medications. He states his orthopedist he sees for his ankle is Dr. Luna Glasgow, but has not seen him in about a year. He denies numbness, weakness, or tingling of the LLE. He states he has had surgery on the left knee ten years ago. Pt states he his disabled and does not work.  Past Medical History  Diagnosis Date  . Anxiety   . Colon polyps     adenomatous polyps on multiple colonoscopies, starting in his early 42s  . Stroke 2005    post knee surgery in 2005  . COPD (chronic obstructive pulmonary disease)   . Ankle fracture, right 06/2012  . Kidney stones   . Depression   . Hypertension   . Sleep apnea   . Arthritis   . Seizures     has seizures weekly  . History of DVT (deep vein thrombosis)    Past Surgical History  Procedure Laterality Date  . Colonoscopy  06/08/2004    Dr. Rehman:Small  polyp cold snared from transverse colon/ Small external hemorrhoids  . Colonoscopy  06/16/09    [facility]:ASNKN internal hemorrhoids/simple adenomas (four polyps removed). Random colon biopsies negative for microscopic colitis. Screening colonoscopy in 5 years with propofol recommended by Dr. Oneida Alar.  . Esophagogastroduodenoscopy  06/16/09    SLF: distal peptic stricture, mild erythema, esophagus dilated to 16 mm. Patchy erythema in the antrum with occasional erosion with active oozing. Patchy erythema in the duodenal bulb. Small bowel biopsies benign. Minimal chronic gastritis with no H. pylori.  . Left knee surgery  2005  . Colonoscopy with propofol N/A 07/22/2012    Procedure: COLONOSCOPY WITH PROPOFOL;  Surgeon: Danie Binder, MD;  Location: [facility];  Service: Endoscopy;  Laterality: N/A;  in cecum at 0853, out at 0901total time = 8 minutes  . Esophagogastroduodenoscopy (egd) with propofol N/A 07/22/2012    Procedure: ESOPHAGOGASTRODUODENOSCOPY (EGD) WITH PROPOFOL;  Surgeon: Danie Binder, MD;  Location: [facility];  Service: Endoscopy;  Laterality: N/A;  . Savory dilation N/A 07/22/2012    Procedure: SAVORY DILATION;  Surgeon: Danie Binder, MD;  Location: [facility];  Service: Endoscopy;  Laterality: N/A;  15, 16, 17  . Esophageal biopsy N/A 07/22/2012    Procedure: BIOPSY;  Surgeon: Danie Binder, MD;  Location: [facility];  Service: Endoscopy;  Laterality: N/A;  Family History  Problem Relation Age of Onset  . Cancer Father     deceased age 92, metastatic cancer ?stomach  . Colon cancer Mother     diagnosed in her 48s, deceased at 64  . Colon cancer Other     2 maternal uncles and one first cousin  . Breast cancer Mother    History  Substance Use Topics  . Smoking status: Current Every Day Smoker -- 0.25 packs/day for 40 years    Types: Cigarettes  . Smokeless tobacco: Never Used  . Alcohol Use: No    Review of Systems  Constitutional: Negative for fever.  Musculoskeletal: Positive for arthralgias  and joint swelling. Negative for myalgias.  Neurological: Negative for weakness and numbness.    Allergies  Review of patient's allergies indicates no known allergies.  Home Medications   Prior to Admission medications   Medication Sig Start Date End Date Taking? Authorizing Provider  albuterol (PROVENTIL HFA;VENTOLIN HFA) 108 (90 BASE) MCG/ACT inhaler Inhale 2 puffs into the lungs every 6 (six) hours as needed for wheezing or shortness of breath.   Yes Historical Provider, MD  ALPRAZolam Duanne Moron) 1 MG tablet Take 1 mg by mouth 4 (four) times daily.    Yes Historical Provider, MD  FLUoxetine (PROZAC) 40 MG capsule Take 40 mg by mouth every morning.   Yes Historical Provider, MD  NEXIUM 40 MG capsule Take 80 mg by mouth daily.  06/10/12  Yes Historical Provider, MD  oxyCODONE-acetaminophen (PERCOCET/ROXICET) 5-325 MG per tablet Take 1 tablet by mouth every 4 (four) hours as needed. 11/22/13   Evalee Jefferson, PA-C   Triage Vitals: BP 169/86  Pulse 79  Temp(Src) 98.6 F (37 C) (Oral)  Resp 18  Ht 5\' 10"  (1.778 m)  Wt 153 lb (69.4 kg)  BMI 21.95 kg/m2  SpO2 97% Physical Exam  Constitutional: He appears well-developed and well-nourished.  HENT:  Head: Atraumatic.  Neck: Normal range of motion.  Cardiovascular:  Pedal pulses intact and equal bilaterally.  Musculoskeletal: He exhibits edema and tenderness.       Left knee: He exhibits decreased range of motion, swelling and effusion. He exhibits no ecchymosis, no deformity, no laceration, no erythema, no LCL laxity and no MCL laxity. Tenderness found. Patellar tendon tenderness noted. No medial joint line and no lateral joint line tenderness noted.  Left achilles tendon intact. Tibia and left ankle nontender. Calf is soft.  Neurological: He is alert. He has normal strength. He displays normal reflexes. No sensory deficit.  Skin: Skin is warm and dry.  Psychiatric: He has a normal mood and affect.    ED Course  Procedures (including  critical care time) DIAGNOSTIC STUDIES: Oxygen Saturation is 97% on RA, normal by my interpretation.   COORDINATION OF CARE: 12:31 PM-  Will give pt Dilaudid injection for pain. Will provide knee immobilizer, Jones dressing and crutches. Advised pt to RICE the left knee and advised pt to contact Dr. Luna Glasgow to follow up this week. Will also refer to orthopedist on call to see if Dr. Luna Glasgow is unavailable. Pt verbalizes understanding and agrees to plan.  Medications  HYDROmorphone (DILAUDID) injection 1 mg (1 mg Intramuscular Given 11/22/13 1247)  ondansetron (ZOFRAN-ODT) disintegrating tablet 8 mg (8 mg Oral Given 11/22/13 1247)    Labs Review Labs Reviewed - No data to display  Imaging Review Dg Knee Complete 4 Views Left  11/22/2013   CLINICAL DATA:  Fall.  Previous surgery.  EXAM: LEFT KNEE - COMPLETE 4+  VIEW  COMPARISON:  None.  FINDINGS: There are mild degenerative changes in the knee, particularly in the medial knee compartment. There appears to be pre-patellar soft tissue swelling on the lateral view. In addition, there is a lucency extending through the inferior aspect of the patella and this may represent a non or minimally displaced fracture of the patella. There may be a small suprapatellar joint effusion. The distal femur and proximal tibia appear to be intact.  IMPRESSION: Lucency along the inferior aspect of the patella with the adjacent soft tissue swelling. Findings are concerning for a subtle fracture at this location. Recommend clinical correlation.   Electronically Signed   By: Markus Daft M.D.   On: 11/22/2013 12:08     EKG Interpretation None      MDM   Final diagnoses:  Patella fracture, left, closed, initial encounter    Patients labs and/or radiological studies were viewed and considered during the medical decision making and disposition process. Pt was placed in knee immobilizer, crutches given.  Instructed RICE, nonweight bearing.  Call Dr. Luna Glasgow for an office  visit this week for recheck.  I personally performed the services described in this documentation, which was scribed in my presence. The recorded information has been reviewed and is accurate.    Evalee Jefferson, PA-C 11/22/13 2118

## 2013-11-23 NOTE — ED Provider Notes (Signed)
Medical screening examination/treatment/procedure(s) were performed by non-physician practitioner and as supervising physician I was immediately available for consultation/collaboration.   EKG Interpretation None       Nat Christen, MD 11/23/13 1626

## 2014-01-04 ENCOUNTER — Encounter (HOSPITAL_COMMUNITY): Payer: Self-pay | Admitting: Emergency Medicine

## 2014-01-04 ENCOUNTER — Emergency Department (HOSPITAL_COMMUNITY): Payer: Medicare Other

## 2014-01-04 ENCOUNTER — Emergency Department (HOSPITAL_COMMUNITY)
Admission: EM | Admit: 2014-01-04 | Discharge: 2014-01-04 | Disposition: A | Discharge status: Home / Self Care | Payer: Medicare Other | Attending: Emergency Medicine | Admitting: Emergency Medicine

## 2014-01-04 DIAGNOSIS — F411 Generalized anxiety disorder: Secondary | ICD-10-CM | POA: Diagnosis not present

## 2014-01-04 DIAGNOSIS — J441 Chronic obstructive pulmonary disease with (acute) exacerbation: Secondary | ICD-10-CM | POA: Insufficient documentation

## 2014-01-04 DIAGNOSIS — Z8673 Personal history of transient ischemic attack (TIA), and cerebral infarction without residual deficits: Secondary | ICD-10-CM | POA: Insufficient documentation

## 2014-01-04 DIAGNOSIS — Z86718 Personal history of other venous thrombosis and embolism: Secondary | ICD-10-CM | POA: Insufficient documentation

## 2014-01-04 DIAGNOSIS — F17200 Nicotine dependence, unspecified, uncomplicated: Secondary | ICD-10-CM | POA: Insufficient documentation

## 2014-01-04 DIAGNOSIS — Z79899 Other long term (current) drug therapy: Secondary | ICD-10-CM | POA: Diagnosis not present

## 2014-01-04 DIAGNOSIS — Z860100 Personal history of colon polyps, unspecified: Secondary | ICD-10-CM | POA: Insufficient documentation

## 2014-01-04 DIAGNOSIS — I10 Essential (primary) hypertension: Secondary | ICD-10-CM | POA: Diagnosis not present

## 2014-01-04 DIAGNOSIS — Y9289 Other specified places as the place of occurrence of the external cause: Secondary | ICD-10-CM | POA: Insufficient documentation

## 2014-01-04 DIAGNOSIS — R062 Wheezing: Secondary | ICD-10-CM

## 2014-01-04 DIAGNOSIS — W1809XA Striking against other object with subsequent fall, initial encounter: Secondary | ICD-10-CM | POA: Insufficient documentation

## 2014-01-04 DIAGNOSIS — S298XXA Other specified injuries of thorax, initial encounter: Secondary | ICD-10-CM | POA: Diagnosis present

## 2014-01-04 DIAGNOSIS — Z87442 Personal history of urinary calculi: Secondary | ICD-10-CM | POA: Diagnosis not present

## 2014-01-04 DIAGNOSIS — F3289 Other specified depressive episodes: Secondary | ICD-10-CM | POA: Diagnosis not present

## 2014-01-04 DIAGNOSIS — M129 Arthropathy, unspecified: Secondary | ICD-10-CM | POA: Insufficient documentation

## 2014-01-04 DIAGNOSIS — Y9389 Activity, other specified: Secondary | ICD-10-CM | POA: Diagnosis not present

## 2014-01-04 DIAGNOSIS — Z8781 Personal history of (healed) traumatic fracture: Secondary | ICD-10-CM | POA: Diagnosis not present

## 2014-01-04 DIAGNOSIS — Z8669 Personal history of other diseases of the nervous system and sense organs: Secondary | ICD-10-CM | POA: Insufficient documentation

## 2014-01-04 DIAGNOSIS — F329 Major depressive disorder, single episode, unspecified: Secondary | ICD-10-CM | POA: Diagnosis not present

## 2014-01-04 DIAGNOSIS — R0781 Pleurodynia: Secondary | ICD-10-CM

## 2014-01-04 DIAGNOSIS — W19XXXA Unspecified fall, initial encounter: Secondary | ICD-10-CM

## 2014-01-04 MED ORDER — OXYCODONE-ACETAMINOPHEN 5-325 MG PO TABS
2.0000 | ORAL_TABLET | Freq: Once | ORAL | Status: AC
  Administered 2014-01-04: 2 via ORAL
  Filled 2014-01-04: qty 2

## 2014-01-04 MED ORDER — ALBUTEROL SULFATE (2.5 MG/3ML) 0.083% IN NEBU: 5.0000 mg | INHALATION_SOLUTION | Freq: Once | RESPIRATORY_TRACT | Status: DC

## 2014-01-04 MED ORDER — ALBUTEROL SULFATE (2.5 MG/3ML) 0.083% IN NEBU
2.5000 mg | INHALATION_SOLUTION | Freq: Once | RESPIRATORY_TRACT | Status: AC
  Administered 2014-01-04: 2.5 mg via RESPIRATORY_TRACT
  Filled 2014-01-04: qty 3

## 2014-01-04 MED ORDER — IPRATROPIUM BROMIDE 0.02 % IN SOLN: 0.5000 mg | Freq: Once | RESPIRATORY_TRACT | Status: DC

## 2014-01-04 MED ORDER — OXYCODONE-ACETAMINOPHEN 5-325 MG PO TABS
2.0000 | ORAL_TABLET | Freq: Four times a day (QID) | ORAL | Status: DC | PRN
Start: 2014-01-04 — End: 2019-03-10

## 2014-01-04 MED ORDER — IPRATROPIUM-ALBUTEROL 0.5-2.5 (3) MG/3ML IN SOLN
3.0000 mL | Freq: Once | RESPIRATORY_TRACT | Status: AC
  Administered 2014-01-04: 3 mL via RESPIRATORY_TRACT
  Filled 2014-01-04: qty 3

## 2014-01-04 NOTE — Discharge Instructions (Signed)

## 2014-01-04 NOTE — ED Provider Notes (Signed)
CSN: [id number]     Arrival date & time 01/04/14  1001 History   First MD Initiated Contact with Patient 01/04/14 1033     Chief Complaint  Patient presents with  . Fall     (Consider location/radiation/quality/duration/timing/severity/associated sxs/prior Treatment) HPI Comments: Pt states that he is having left sided rib pain that started after falling into the bed post yesterday. Pt states that the pain is worse with coughing and breathing. States that he tripped and the post with a large ball hit middle of his left chest. Denies fever and cough. Hasn't taken anything for pain  The history is provided by the patient. No language interpreter was used.    Past Medical History  Diagnosis Date  . Anxiety   . Colon polyps     adenomatous polyps on multiple colonoscopies, starting in his early 107s  . Stroke 2005    post knee surgery in 2005  . COPD (chronic obstructive pulmonary disease)   . Ankle fracture, right 06/2012  . Kidney stones   . Depression   . Hypertension   . Sleep apnea   . Arthritis   . Seizures     has seizures weekly  . History of DVT (deep vein thrombosis)    Past Surgical History  Procedure Laterality Date  . Colonoscopy  06/08/2004    Dr. Rehman:Small polyp cold snared from transverse colon/ Small external hemorrhoids  . Colonoscopy  06/16/09    DJS:HFWYO internal hemorrhoids/simple adenomas (four polyps removed). Random colon biopsies negative for microscopic colitis. Screening colonoscopy in 5 years with propofol recommended by Dr. Oneida Alar.  . Esophagogastroduodenoscopy  06/16/09    SLF: distal peptic stricture, mild erythema, esophagus dilated to 16 mm. Patchy erythema in the antrum with occasional erosion with active oozing. Patchy erythema in the duodenal bulb. Small bowel biopsies benign. Minimal chronic gastritis with no H. pylori.  . Left knee surgery  2005  . Colonoscopy with propofol N/A 07/22/2012    Procedure: COLONOSCOPY WITH PROPOFOL;  Surgeon: Danie Binder, MD;  Location: [facility];  Service: Endoscopy;  Laterality: N/A;  in cecum at 0853, out at 0901total time = 8 minutes  . Esophagogastroduodenoscopy (egd) with propofol N/A 07/22/2012    Procedure: ESOPHAGOGASTRODUODENOSCOPY (EGD) WITH PROPOFOL;  Surgeon: Danie Binder, MD;  Location: [facility];  Service: Endoscopy;  Laterality: N/A;  . Savory dilation N/A 07/22/2012    Procedure: SAVORY DILATION;  Surgeon: Danie Binder, MD;  Location: [facility];  Service: Endoscopy;  Laterality: N/A;  15, 16, 17  . Esophageal biopsy N/A 07/22/2012    Procedure: BIOPSY;  Surgeon: Danie Binder, MD;  Location: [facility];  Service: Endoscopy;  Laterality: N/A;   Family History  Problem Relation Age of Onset  . Cancer Father     deceased age 69, metastatic cancer ?stomach  . Colon cancer Mother     diagnosed in her 28s, deceased at 63  . Colon cancer Other     2 maternal uncles and one first cousin  . Breast cancer Mother    History  Substance Use Topics  . Smoking status: Current Every Day Smoker -- 0.25 packs/day for 40 years    Types: Cigarettes  . Smokeless tobacco: Never Used  . Alcohol Use: No    Review of Systems  Constitutional: Negative.   Respiratory: Negative.   Cardiovascular: Positive for chest pain.      Allergies  Review of patient's allergies indicates no known allergies.  Home Medications  Prior to Admission medications   Medication Sig Start Date End Date Taking? Authorizing Provider  albuterol (PROVENTIL HFA;VENTOLIN HFA) 108 (90 BASE) MCG/ACT inhaler Inhale 2 puffs into the lungs every 6 (six) hours as needed for wheezing or shortness of breath.    Historical Provider, MD  ALPRAZolam Duanne Moron) 1 MG tablet Take 1 mg by mouth 4 (four) times daily.     Historical Provider, MD  FLUoxetine (PROZAC) 40 MG capsule Take 40 mg by mouth every morning.    Historical Provider, MD  NEXIUM 40 MG capsule Take 80 mg by mouth daily.  06/10/12   Historical Provider, MD  oxyCODONE-acetaminophen  (PERCOCET/ROXICET) 5-325 MG per tablet Take 1 tablet by mouth every 4 (four) hours as needed. 11/22/13   Evalee Jefferson, PA-C   BP 188/111  Pulse 64  Temp(Src) 98.7 F (37.1 C)  Resp 20  Ht 5\' 10"  (1.778 m)  Wt 152 lb (68.947 kg)  BMI 21.81 kg/m2  SpO2 96% Physical Exam  Nursing note and vitals reviewed. Constitutional: He is oriented to person, place, and time. He appears well-developed and well-nourished.  Cardiovascular: Normal rate and regular rhythm.   Pulmonary/Chest: Effort normal. No respiratory distress. He has wheezes. He has rales.  Left sided anterior mid ribs tender to palpation  Abdominal: Soft. Bowel sounds are normal.  Musculoskeletal: Normal range of motion.  Neurological: He is alert and oriented to person, place, and time.  Skin:  Patches of red dry scaly skin noted to hands bilaterally    ED Course  Procedures (including critical care time) Labs Review Labs Reviewed - No data to display  Imaging Review Dg Chest 2 View  01/04/2014   CLINICAL DATA:  Fall.  EXAM: CHEST  2 VIEW  COMPARISON:  09/02/2007 .  FINDINGS: Mediastinum and hilar structures are normal. Mild basilar atelectasis. Heart size normal. No pleural effusion or pneumothorax. No acute bony abnormality identified.  IMPRESSION: Mild bibasilar atelectasis.  Exam is otherwise unremarkable.   Electronically Signed   By: Marcello Moores  Register   On: 01/04/2014 11:52   Dg Ribs Unilateral Left  01/04/2014   CLINICAL DATA:  Fall.  EXAM: LEFT RIBS - 2 VIEW  COMPARISON:  Chest x-ray 01/04/2014.  FINDINGS: No evidence of displaced rib fracture or pneumothorax. No focal acute bony abnormality .  IMPRESSION: No acute abnormality.   Electronically Signed   By: Marcello Moores  Register   On: 01/04/2014 11:57     EKG Interpretation   Date/Time:  Monday January 04 2014 10:38:56 EDT Ventricular Rate:  56 PR Interval:  119 QRS Duration: 106 QT Interval:  449 QTC Calculation: 433 R Axis:   88 Text Interpretation:  Sinus rhythm  Borderline short PR interval Probable  lateral infarct, age indeterminate Minimal ST elevation, inferior leads  Confirmed by ZAMMIT  MD, JOSEPH [phone]) on 01/04/2014 12:34:52 PM      MDM   Final diagnoses:  Rib pain on left side  Fall, initial encounter  Essential hypertension  Wheezing    No acute injury noted. Pt is feeling better and no longer wheezing after oxycodone and neb treatment. Wills send home on something for pain. Discussed the importance of recheck or blood pressure    Glendell Docker, NP 01/04/14 1235

## 2014-01-04 NOTE — ED Notes (Signed)
Pt c/o pain to left chest since falling into bed post yesterday. Pain worse with cough/breathing.

## 2014-01-05 NOTE — ED Provider Notes (Signed)
Medical screening examination/treatment/procedure(s) were performed by non-physician practitioner and as supervising physician I was immediately available for consultation/collaboration.   EKG Interpretation   Date/Time:  Monday January 04 2014 10:38:56 EDT Ventricular Rate:  56 PR Interval:  119 QRS Duration: 106 QT Interval:  449 QTC Calculation: 433 R Axis:   88 Text Interpretation:  Sinus rhythm Borderline short PR interval Probable  lateral infarct, age indeterminate Minimal ST elevation, inferior leads  Confirmed by Reda Gettis  MD, Ashyla Luth [phone]) on 01/04/2014 12:34:52 PM        Maudry Diego, MD 01/05/14 1251

## 2014-01-06 ENCOUNTER — Emergency Department (HOSPITAL_COMMUNITY)
Admission: EM | Admit: 2014-01-06 | Discharge: 2014-01-06 | Disposition: A | Discharge status: Home / Self Care | Payer: Medicare Other | Attending: Emergency Medicine | Admitting: Emergency Medicine

## 2014-01-06 ENCOUNTER — Emergency Department (HOSPITAL_COMMUNITY): Payer: Medicare Other

## 2014-01-06 ENCOUNTER — Encounter (HOSPITAL_COMMUNITY): Payer: Self-pay | Admitting: Emergency Medicine

## 2014-01-06 DIAGNOSIS — Z8673 Personal history of transient ischemic attack (TIA), and cerebral infarction without residual deficits: Secondary | ICD-10-CM | POA: Insufficient documentation

## 2014-01-06 DIAGNOSIS — M129 Arthropathy, unspecified: Secondary | ICD-10-CM | POA: Insufficient documentation

## 2014-01-06 DIAGNOSIS — Z79899 Other long term (current) drug therapy: Secondary | ICD-10-CM | POA: Diagnosis not present

## 2014-01-06 DIAGNOSIS — R079 Chest pain, unspecified: Secondary | ICD-10-CM | POA: Insufficient documentation

## 2014-01-06 DIAGNOSIS — Z86718 Personal history of other venous thrombosis and embolism: Secondary | ICD-10-CM | POA: Insufficient documentation

## 2014-01-06 DIAGNOSIS — R071 Chest pain on breathing: Secondary | ICD-10-CM | POA: Diagnosis not present

## 2014-01-06 DIAGNOSIS — F411 Generalized anxiety disorder: Secondary | ICD-10-CM | POA: Insufficient documentation

## 2014-01-06 DIAGNOSIS — F17200 Nicotine dependence, unspecified, uncomplicated: Secondary | ICD-10-CM | POA: Diagnosis not present

## 2014-01-06 DIAGNOSIS — Z87442 Personal history of urinary calculi: Secondary | ICD-10-CM | POA: Diagnosis not present

## 2014-01-06 DIAGNOSIS — I10 Essential (primary) hypertension: Secondary | ICD-10-CM | POA: Insufficient documentation

## 2014-01-06 DIAGNOSIS — J4489 Other specified chronic obstructive pulmonary disease: Secondary | ICD-10-CM | POA: Insufficient documentation

## 2014-01-06 DIAGNOSIS — Z8781 Personal history of (healed) traumatic fracture: Secondary | ICD-10-CM | POA: Diagnosis not present

## 2014-01-06 DIAGNOSIS — R0789 Other chest pain: Secondary | ICD-10-CM

## 2014-01-06 DIAGNOSIS — J449 Chronic obstructive pulmonary disease, unspecified: Secondary | ICD-10-CM | POA: Diagnosis not present

## 2014-01-06 DIAGNOSIS — Z860100 Personal history of colon polyps, unspecified: Secondary | ICD-10-CM | POA: Insufficient documentation

## 2014-01-06 DIAGNOSIS — F329 Major depressive disorder, single episode, unspecified: Secondary | ICD-10-CM | POA: Diagnosis not present

## 2014-01-06 DIAGNOSIS — F3289 Other specified depressive episodes: Secondary | ICD-10-CM | POA: Diagnosis not present

## 2014-01-06 LAB — CBC WITH DIFFERENTIAL/PLATELET
Basophils Absolute: 0 10*3/uL (ref 0.0–0.1)
Basophils Relative: 0 % (ref 0–1)
Eosinophils Absolute: 0.2 10*3/uL (ref 0.0–0.7)
Eosinophils Relative: 2 % (ref 0–5)
HCT: 41.6 % (ref 39.0–52.0)
Hemoglobin: 14.2 g/dL (ref 13.0–17.0)
Lymphocytes Relative: 9 % — ABNORMAL LOW (ref 12–46)
Lymphs Abs: 1.1 10*3/uL (ref 0.7–4.0)
MCH: 33 pg (ref 26.0–34.0)
MCHC: 34.1 g/dL (ref 30.0–36.0)
MCV: 96.7 fL (ref 78.0–100.0)
Monocytes Absolute: 1 10*3/uL (ref 0.1–1.0)
Monocytes Relative: 9 % (ref 3–12)
Neutro Abs: 9.3 10*3/uL — ABNORMAL HIGH (ref 1.7–7.7)
Neutrophils Relative %: 80 % — ABNORMAL HIGH (ref 43–77)
Platelets: 263 10*3/uL (ref 150–400)
RBC: 4.3 MIL/uL (ref 4.22–5.81)
RDW: 13.7 % (ref 11.5–15.5)
WBC: 11.7 10*3/uL — ABNORMAL HIGH (ref 4.0–10.5)

## 2014-01-06 LAB — BASIC METABOLIC PANEL
Anion gap: 12 (ref 5–15)
BUN: 13 mg/dL (ref 6–23)
CO2: 27 mEq/L (ref 19–32)
Calcium: 8.9 mg/dL (ref 8.4–10.5)
Chloride: 99 mEq/L (ref 96–112)
Creatinine, Ser: 0.81 mg/dL (ref 0.50–1.35)
GFR calc Af Amer: >90 mL/min (ref >90)
GFR calc non Af Amer: >90 mL/min (ref >90)
Glucose, Bld: 98 mg/dL (ref 70–99)
Potassium: 4.5 mEq/L (ref 3.7–5.3)
Sodium: 138 mEq/L (ref 137–147)

## 2014-01-06 MED ORDER — HYDROMORPHONE HCL 1 MG/ML IJ SOLN
1.0000 mg | Freq: Once | INTRAMUSCULAR | Status: AC
  Administered 2014-01-06: 1 mg via INTRAVENOUS
  Filled 2014-01-06: qty 1

## 2014-01-06 MED ORDER — OXYCODONE-ACETAMINOPHEN 5-325 MG PO TABS: 1.0000 | ORAL_TABLET | ORAL | Status: DC | PRN

## 2014-01-06 MED ORDER — SODIUM CHLORIDE 0.9 % IV SOLN
Freq: Once | INTRAVENOUS | Status: AC
  Administered 2014-01-06: 14:00:00 via INTRAVENOUS

## 2014-01-06 MED ORDER — OXYCODONE-ACETAMINOPHEN 5-325 MG PO TABS: 2.0000 | ORAL_TABLET | Freq: Once | ORAL | Status: DC

## 2014-01-06 MED ORDER — IOHEXOL 300 MG/ML  SOLN
80.0000 mL | Freq: Once | INTRAMUSCULAR | Status: AC | PRN
  Administered 2014-01-06: 80 mL via INTRAVENOUS

## 2014-01-06 NOTE — Discharge Instructions (Signed)
Chest Wall Pain °Chest wall pain is pain in or around the bones and muscles of your chest. It may take up to 6 weeks to get better. It may take longer if you must stay physically active in your work and activities.  °CAUSES  °Chest wall pain may happen on its own. However, it may be caused by: °· A viral illness like the flu. °· Injury. °· Coughing. °· Exercise. °· Arthritis. °· Fibromyalgia. °· Shingles. °HOME CARE INSTRUCTIONS  °· Avoid overtiring physical activity. Try not to strain or perform activities that cause pain. This includes any activities using your chest or your abdominal and side muscles, especially if heavy weights are used. °· Put ice on the sore area. °· Put ice in a plastic bag. °· Place a towel between your skin and the bag. °· Leave the ice on for 15-20 minutes per hour while awake for the first 2 days. °· Only take over-the-counter or prescription medicines for pain, discomfort, or fever as directed by your caregiver. °SEEK IMMEDIATE MEDICAL CARE IF:  °· Your pain increases, or you are very uncomfortable. °· You have a fever. °· Your chest pain becomes worse. °· You have new, unexplained symptoms. °· You have nausea or vomiting. °· You feel sweaty or lightheaded. °· You have a cough with phlegm (sputum), or you cough up blood. °MAKE SURE YOU:  °· Understand these instructions. °· Will watch your condition. °· Will get help right away if you are not doing well or get worse. °Document Released: 04/02/2005 Document Revised: 06/25/2011 Document Reviewed: 11/27/2010 °ExitCare® Patient Information ©2015 [employer]. This information is not intended to replace advice given to you by your health care provider. Make sure you discuss any questions you have with your health care provider. °Heat Therapy °Heat therapy can help ease sore, stiff, injured, and tight muscles and joints. Heat relaxes your muscles, which may help ease your pain.  °RISKS AND COMPLICATIONS °If you have any of the following  conditions, do not use heat therapy unless your health care provider has approved: °· Poor circulation. °· Healing wounds or scarred skin in the area being treated. °· Diabetes, heart disease, or high blood pressure. °· Not being able to feel (numbness) the area being treated. °· Unusual swelling of the area being treated. °· Active infections. °· Blood clots. °· Cancer. °· Inability to communicate pain. This may include young children and people who have problems with their brain function (dementia). °· Pregnancy. °Heat therapy should only be used on old, pre-existing, or long-lasting (chronic) injuries. Do not use heat therapy on new injuries unless directed by your health care provider. °HOW TO USE HEAT THERAPY °There are several different kinds of heat therapy, including: °· Moist heat pack. °· Warm water bath. °· Hot water bottle. °· Electric heating pad. °· Heated gel pack. °· Heated wrap. °· Electric heating pad. °Use the heat therapy method suggested by your health care provider. Follow your health care provider's instructions on when and how to use heat therapy. °GENERAL HEAT THERAPY RECOMMENDATIONS °· Do not sleep while using heat therapy. Only use heat therapy while you are awake. °· Your skin may turn pink while using heat therapy. Do not use heat therapy if your skin turns red. °· Do not use heat therapy if you have new pain. °· High heat or long exposure to heat can cause burns. Be careful when using heat therapy to avoid burning your skin. °· Do not use heat therapy on areas   of your skin that are already irritated, such as with a rash or sunburn. °SEEK MEDICAL CARE IF: °· You have blisters, redness, swelling, or numbness. °· You have new pain. °· Your pain is worse. °MAKE SURE YOU: °· Understand these instructions. °· Will watch your condition. °· Will get help right away if you are not doing well or get worse. °Document Released: 06/25/2011 Document Revised: 08/17/2013 Document Reviewed:  05/26/2013 °ExitCare® Patient Information ©2015 [employer]. This information is not intended to replace advice given to you by your health care provider. Make sure you discuss any questions you have with your health care provider. ° °

## 2014-01-06 NOTE — ED Notes (Addendum)
Pt fell over his dog on 01/04/2014 and fell into his bed frame. Pt reports the pain medication given in ED has not helped with the pain. His PCP recommended he f/u in the ED. Pt reports pain has increased over the last 2 days.

## 2014-01-06 NOTE — ED Provider Notes (Signed)
CSN: [id number]     Arrival date & time 01/06/14  1109 History   First MD Initiated Contact with Patient 01/06/14 1202     Chief Complaint  Patient presents with  . Rib Injury     (Consider location/radiation/quality/duration/timing/severity/associated sxs/prior Treatment) Patient is a 58 y.o. male presenting with chest pain. The history is provided by the patient. No language interpreter was used.  Chest Pain Pain location:  L chest Pain quality: sharp and stabbing   Pain radiates to:  Does not radiate Pain radiates to the back: no   Pain severity:  Severe Onset quality:  Sudden Duration:  2 days Associated symptoms: no fever   Associated symptoms comment:  He reports continued severe left chest pain after falling and hitting the left anterior chest wall 2 days ago. He was seen at that time and reports a negative chest x-ray. He was given Percocet and reports that his pain is unmanaged. He has not followed up with his primary care physician.    Past Medical History  Diagnosis Date  . Anxiety   . Colon polyps     adenomatous polyps on multiple colonoscopies, starting in his early 32s  . Stroke 2005    post knee surgery in 2005  . COPD (chronic obstructive pulmonary disease)   . Ankle fracture, right 06/2012  . Kidney stones   . Depression   . Hypertension   . Sleep apnea   . Arthritis   . Seizures     has seizures weekly  . History of DVT (deep vein thrombosis)    Past Surgical History  Procedure Laterality Date  . Colonoscopy  06/08/2004    Dr. Rehman:Small polyp cold snared from transverse colon/ Small external hemorrhoids  . Colonoscopy  06/16/09    [facility]:IONGE internal hemorrhoids/simple adenomas (four polyps removed). Random colon biopsies negative for microscopic colitis. Screening colonoscopy in 5 years with propofol recommended by Dr. Oneida Alar.  . Esophagogastroduodenoscopy  06/16/09    SLF: distal peptic stricture, mild erythema, esophagus dilated to 16 mm. Patchy  erythema in the antrum with occasional erosion with active oozing. Patchy erythema in the duodenal bulb. Small bowel biopsies benign. Minimal chronic gastritis with no H. pylori.  . Left knee surgery  2005  . Colonoscopy with propofol N/A 07/22/2012    Procedure: COLONOSCOPY WITH PROPOFOL;  Surgeon: Danie Binder, MD;  Location: [facility];  Service: Endoscopy;  Laterality: N/A;  in cecum at 0853, out at 0901total time = 8 minutes  . Esophagogastroduodenoscopy (egd) with propofol N/A 07/22/2012    Procedure: ESOPHAGOGASTRODUODENOSCOPY (EGD) WITH PROPOFOL;  Surgeon: Danie Binder, MD;  Location: [facility];  Service: Endoscopy;  Laterality: N/A;  . Savory dilation N/A 07/22/2012    Procedure: SAVORY DILATION;  Surgeon: Danie Binder, MD;  Location: [facility];  Service: Endoscopy;  Laterality: N/A;  15, 16, 17  . Esophageal biopsy N/A 07/22/2012    Procedure: BIOPSY;  Surgeon: Danie Binder, MD;  Location: [facility];  Service: Endoscopy;  Laterality: N/A;   Family History  Problem Relation Age of Onset  . Cancer Father     deceased age 62, metastatic cancer ?stomach  . Colon cancer Mother     diagnosed in her 80s, deceased at 52  . Colon cancer Other     2 maternal uncles and one first cousin  . Breast cancer Mother    History  Substance Use Topics  . Smoking status: Current Every Day Smoker -- 0.25 packs/day for  40 years    Types: Cigarettes  . Smokeless tobacco: Never Used  . Alcohol Use: No    Review of Systems  Constitutional: Negative for fever and chills.  HENT: Negative.   Respiratory: Negative.        Pain in left chest with breathing. No cough or SoB.  Cardiovascular: Positive for chest pain.  Gastrointestinal: Negative.   Musculoskeletal: Negative.   Skin: Negative.   Neurological: Negative.       Allergies  Lortab  Home Medications   Prior to Admission medications   Medication Sig Start Date End Date Taking? Authorizing Provider  albuterol (PROVENTIL HFA;VENTOLIN HFA) 108 (90  BASE) MCG/ACT inhaler Inhale 2 puffs into the lungs every 6 (six) hours as needed for wheezing or shortness of breath.   Yes Historical Provider, MD  ALPRAZolam Duanne Moron) 1 MG tablet Take 1 mg by mouth 4 (four) times daily.    Yes Historical Provider, MD  FLUoxetine (PROZAC) 40 MG capsule Take 40 mg by mouth every morning.   Yes Historical Provider, MD  NEXIUM 40 MG capsule Take 80 mg by mouth daily.  06/10/12  Yes Historical Provider, MD  oxyCODONE-acetaminophen (PERCOCET/ROXICET) 5-325 MG per tablet Take 2 tablets by mouth every 6 (six) hours as needed for moderate pain or severe pain. 01/04/14  Yes Glendell Docker, NP   BP 133/78  Pulse 65  Temp(Src) 97.7 F (36.5 C)  Resp 20  SpO2 98% Physical Exam  Constitutional: He is oriented to person, place, and time. He appears well-developed and well-nourished.  HENT:  Head: Normocephalic.  Neck: Normal range of motion. Neck supple.  Cardiovascular: Normal rate and regular rhythm.   Pulmonary/Chest: Effort normal and breath sounds normal. He exhibits tenderness.  Left chest wall tenderness without swelling or bruising.   Abdominal: Soft. Bowel sounds are normal. There is no tenderness. There is no rebound and no guarding.  Musculoskeletal: Normal range of motion.  Neurological: He is alert and oriented to person, place, and time.  Skin: Skin is warm and dry. No rash noted.  Psychiatric: He has a normal mood and affect.    ED Course  Procedures (including critical care time) Labs Review Labs Reviewed - No data to display  Imaging Review Dg Ribs Unilateral W/chest Left  01/06/2014   CLINICAL DATA:  Left rib pain post trauma  EXAM: LEFT RIBS AND CHEST - 3+ VIEW  COMPARISON:  01/04/2014  FINDINGS: No fracture or other bone lesions are seen involving the ribs. There is no evidence of pneumothorax or pleural effusion. Heart size and mediastinal contours are within normal limits.  IMPRESSION: Negative.   Electronically Signed   By: Lahoma Crocker M.D.    On: 01/06/2014 13:17     EKG Interpretation None      MDM   Final diagnoses:  None    1. Chest wall pain  Chart reviewed and previous x-ray of chest/ribs found to be negative for fracture of PTX. X-ray repeated to rule out pneumonia/PTX and is negative. He states Percocet not controlling his pain. He also reports fever, vomiting and bloody sputum. Discussed with Dr. Roderic Palau. Will get CT chest for more thorough evaluation. Pain is better with IV dilaudid.   Recommended addition of alleve and PCP follow up. Continue taking percocet.     Dewaine Oats, PA-C 01/06/14 1347  Dewaine Oats, PA-C 01/06/14 1525

## 2014-01-06 NOTE — ED Provider Notes (Signed)
Medical screening examination/treatment/procedure(s) were performed by non-physician practitioner and as supervising physician I was immediately available for consultation/collaboration.   EKG Interpretation None        Maudry Diego, MD 01/06/14 1549

## 2014-01-06 NOTE — ED Notes (Signed)
Pt states he took two Percocet 5/325 at about 1130. States it hasn't been "working".

## 2017-06-11 ENCOUNTER — Encounter: Payer: Self-pay | Admitting: Gastroenterology

## 2019-02-05 ENCOUNTER — Ambulatory Visit (INDEPENDENT_AMBULATORY_CARE_PROVIDER_SITE_OTHER): Payer: Medicare Other | Admitting: Orthopaedic Surgery

## 2019-02-05 ENCOUNTER — Encounter: Payer: Self-pay | Admitting: Orthopaedic Surgery

## 2019-02-05 ENCOUNTER — Other Ambulatory Visit: Payer: Self-pay

## 2019-02-05 ENCOUNTER — Ambulatory Visit: Payer: Medicare Other

## 2019-02-05 VITALS — BP 167/92 | HR 57 | Temp 97.7°F | Ht 70.0 in | Wt 159.0 lb

## 2019-02-05 DIAGNOSIS — F17210 Nicotine dependence, cigarettes, uncomplicated: Secondary | ICD-10-CM | POA: Diagnosis not present

## 2019-02-05 DIAGNOSIS — M25512 Pain in left shoulder: Secondary | ICD-10-CM

## 2019-02-05 DIAGNOSIS — M25511 Pain in right shoulder: Secondary | ICD-10-CM | POA: Diagnosis not present

## 2019-02-05 MED ORDER — HYDROCODONE-ACETAMINOPHEN 5-325 MG PO TABS: ORAL_TABLET | ORAL | 0 refills | Status: DC

## 2019-02-05 MED ORDER — NAPROXEN 500 MG PO TABS: 500.0000 mg | ORAL_TABLET | Freq: Two times a day (BID) | ORAL | 5 refills | Status: DC

## 2019-02-05 NOTE — Patient Instructions (Signed)

## 2019-02-05 NOTE — Progress Notes (Signed)
Subjective:    Patient ID: Adam Wheeler, male    DOB: 03-26-1956, 63 y.o.   MRN: [id number]  HPI He fell on his deck about three weeks ago and hurt his right shoulder.  He has had pain, gradually decreasing ROM, pain with overhead use and rolling over on it at night.  He has no redness or weakness.  He has had some pain with the left shoulder as well but not as severe as the right.  He has tried Tylenol, ice, heat and rubs with no help.  He has no neck pain.  He has no other injury.   Review of Systems  Constitutional: Positive for activity change.  Respiratory: Positive for shortness of breath.   Musculoskeletal: Positive for arthralgias.  Neurological: Positive for seizures.  All other systems reviewed and are negative.  For Review of Systems, all other systems reviewed and are negative.  The following is a summary of the past history medically, past history surgically, known current medicines, social history and family history.  This information is gathered electronically by the computer from prior information and documentation.  I review this each visit and have found including this information at this point in the chart is beneficial and informative.   Past Medical History:  Diagnosis Date  . Ankle fracture, right 06/2012  . Anxiety   . Arthritis   . Colon polyps    adenomatous polyps on multiple colonoscopies, starting in his early 30s  . COPD (chronic obstructive pulmonary disease) ([place])   . Depression   . History of DVT (deep vein thrombosis)   . Hypertension   . Kidney stones   . Seizures ([place])    has seizures weekly  . Sleep apnea   . Stroke [facility]) 2005   post knee surgery in 2005    Past Surgical History:  Procedure Laterality Date  . BIOPSY N/A 07/22/2012   Procedure: BIOPSY;  Surgeon: Danie Binder, MD;  Location: [facility];  Service: Endoscopy;  Laterality: N/A;  . COLONOSCOPY  06/08/2004   Dr. Rehman:Small polyp cold snared from transverse colon/ Small  external hemorrhoids  . COLONOSCOPY  06/16/09   [id number] internal hemorrhoids/simple adenomas (four polyps removed). Random colon biopsies negative for microscopic colitis. Screening colonoscopy in 5 years with propofol recommended by Dr. Oneida Alar.  . COLONOSCOPY WITH PROPOFOL N/A 07/22/2012   Procedure: COLONOSCOPY WITH PROPOFOL;  Surgeon: Danie Binder, MD;  Location: [facility];  Service: Endoscopy;  Laterality: N/A;  in cecum at 0853, out at 0901total time = 8 minutes  . ESOPHAGOGASTRODUODENOSCOPY  06/16/09   SLF: distal peptic stricture, mild erythema, esophagus dilated to 16 mm. Patchy erythema in the antrum with occasional erosion with active oozing. Patchy erythema in the duodenal bulb. Small bowel biopsies benign. Minimal chronic gastritis with no H. pylori.  . ESOPHAGOGASTRODUODENOSCOPY (EGD) WITH PROPOFOL N/A 07/22/2012   Procedure: ESOPHAGOGASTRODUODENOSCOPY (EGD) WITH PROPOFOL;  Surgeon: Danie Binder, MD;  Location: [facility];  Service: Endoscopy;  Laterality: N/A;  . left knee surgery  2005  . SAVORY DILATION N/A 07/22/2012   Procedure: SAVORY DILATION;  Surgeon: Danie Binder, MD;  Location: [facility];  Service: Endoscopy;  Laterality: N/A;  15, 16, 17    Current Outpatient Medications on File Prior to Visit  Medication Sig Dispense Refill  . albuterol (PROVENTIL HFA;VENTOLIN HFA) 108 (90 BASE) MCG/ACT inhaler Inhale 2 puffs into the lungs every 6 (six) hours as needed for wheezing or shortness of breath.    Marland Kitchen  ALPRAZolam (XANAX) 1 MG tablet Take 1 mg by mouth 4 (four) times daily.     Marland Kitchen FLUoxetine (PROZAC) 40 MG capsule Take 40 mg by mouth every morning.    Marland Kitchen NEXIUM 40 MG capsule Take 80 mg by mouth daily.     Marland Kitchen oxyCODONE-acetaminophen (PERCOCET/ROXICET) 5-325 MG per tablet Take 2 tablets by mouth every 6 (six) hours as needed for moderate pain or severe pain. 15 tablet 0  . oxyCODONE-acetaminophen (PERCOCET/ROXICET) 5-325 MG per tablet Take 1-2 tablets by mouth every 4 (four) hours as needed for  severe pain. 15 tablet 0   No current facility-administered medications on file prior to visit.     Social History   Socioeconomic History  . Marital status: Married    Spouse name: Not on file  . Number of children: 4  . Years of education: Not on file  . Highest education level: Not on file  Occupational History  . Occupation: Disabled  Social Needs  . Financial resource strain: Not on file  . Food insecurity    Worry: Not on file    Inability: Not on file  . Transportation needs    Medical: Not on file    Non-medical: Not on file  Tobacco Use  . Smoking status: Current Every Day Smoker    Packs/day: 0.25    Years: 40.00    Pack years: 10.00    Types: Cigarettes  . Smokeless tobacco: Never Used  Substance and Sexual Activity  . Alcohol use: No  . Drug use: No  . Sexual activity: Not on file  Lifestyle  . Physical activity    Days per week: Not on file    Minutes per session: Not on file  . Stress: Not on file  Relationships  . Social Herbalist on phone: Not on file    Gets together: Not on file    Attends religious service: Not on file    Active member of club or organization: Not on file    Attends meetings of clubs or organizations: Not on file    Relationship status: Not on file  . Intimate partner violence    Fear of current or ex partner: Not on file    Emotionally abused: Not on file    Physically abused: Not on file    Forced sexual activity: Not on file  Other Topics Concern  . Not on file  Social History Narrative  . Not on file    Family History  Problem Relation Age of Onset  . Cancer Father        deceased age 71, metastatic cancer ?stomach  . Colon cancer Mother        diagnosed in her 44s, deceased at 61  . Colon cancer Other        2 maternal uncles and one first cousin  . Breast cancer Mother     BP (!) 167/92   Pulse (!) 57   Temp 97.7 F (36.5 C)   Ht 5\' 10"  (1.778 m)   Wt 159 lb (72.1 kg)   BMI 22.81 kg/m    Body mass index is 22.81 kg/m.     Objective:   Physical Exam Vitals signs reviewed.  Constitutional:      Appearance: He is well-developed.  HENT:     Head: Normocephalic and atraumatic.  Eyes:     Conjunctiva/sclera: Conjunctivae normal.     Pupils: Pupils are equal, round, and reactive to light.  Neck:     Musculoskeletal: Normal range of motion and neck supple.  Cardiovascular:     Rate and Rhythm: Normal rate and regular rhythm.  Pulmonary:     Effort: Pulmonary effort is normal.  Abdominal:     Palpations: Abdomen is soft.  Musculoskeletal:     Right shoulder: He exhibits decreased range of motion, tenderness and pain.       Arms:  Skin:    General: Skin is warm and dry.  Neurological:     Mental Status: He is alert and oriented to person, place, and time.     Cranial Nerves: No cranial nerve deficit.     Motor: No abnormal muscle tone.     Coordination: Coordination normal.     Deep Tendon Reflexes: Reflexes are normal and symmetric. Reflexes normal.  Psychiatric:        Behavior: Behavior normal.        Thought Content: Thought content normal.        Judgment: Judgment normal.   x-rays were done of both shoulders, reported separately.        Assessment & Plan:   Encounter Diagnoses  Name Primary?  . Acute pain of right shoulder Yes  . Acute pain of left shoulder   . Nicotine dependence, cigarettes, uncomplicated    PROCEDURE NOTE:  The patient request injection, verbal consent was obtained.  The left shoulder was prepped appropriately after time out was performed.   Sterile technique was observed and injection of 1 cc of Depo-Medrol 40 mg with several cc's of plain xylocaine. Anesthesia was provided by ethyl chloride and a 20-gauge needle was used to inject the shoulder area. A posterior approach was used.  The injection was tolerated well.  A band aid dressing was applied.  The patient was advised to apply ice later today and tomorrow to the  injection sight as needed.  PROCEDURE NOTE:  The patient request injection, verbal consent was obtained.  The right shoulder was prepped appropriately after time out was performed.   Sterile technique was observed and injection of 1 cc of Depo-Medrol 40 mg with several cc's of plain xylocaine. Anesthesia was provided by ethyl chloride and a 20-gauge needle was used to inject the shoulder area. A posterior approach was used.  The injection was tolerated well.  A band aid dressing was applied.  The patient was advised to apply ice later today and tomorrow to the injection sight as needed.  I will begin Naprosyn 500 po bid pc  I will give pain medicine.  I have reviewed the [place] web site prior to prescribing narcotic medicine for this patient.   I will see him back in one month.  He may need MRI then if no better.  Call if any problem.  Precautions discussed.   Electronically Signed Sanjuana Kava, MD 10/22/20209:00 AM

## 2019-03-10 ENCOUNTER — Other Ambulatory Visit: Payer: Self-pay

## 2019-03-10 ENCOUNTER — Ambulatory Visit (INDEPENDENT_AMBULATORY_CARE_PROVIDER_SITE_OTHER): Payer: Medicare Other | Admitting: Orthopaedic Surgery

## 2019-03-10 ENCOUNTER — Encounter: Payer: Self-pay | Admitting: Orthopaedic Surgery

## 2019-03-10 VITALS — BP 170/116 | HR 65 | Temp 97.9°F | Ht 70.0 in | Wt 161.4 lb

## 2019-03-10 DIAGNOSIS — M25512 Pain in left shoulder: Secondary | ICD-10-CM

## 2019-03-10 DIAGNOSIS — M25511 Pain in right shoulder: Secondary | ICD-10-CM | POA: Diagnosis not present

## 2019-03-10 DIAGNOSIS — F17210 Nicotine dependence, cigarettes, uncomplicated: Secondary | ICD-10-CM | POA: Diagnosis not present

## 2019-03-10 MED ORDER — OXYCODONE-ACETAMINOPHEN 5-325 MG PO TABS: 1.0000 | ORAL_TABLET | Freq: Four times a day (QID) | ORAL | 0 refills | Status: AC | PRN

## 2019-03-10 NOTE — Progress Notes (Signed)
Patient [id number] Adam Wheeler, male DOB:December 06, 1955, 63 y.o. [id number]  Chief Complaint  Patient presents with  . Shoulder Pain    Bilateral/r hurting more than left    HPI  Adam Wheeler is a 63 y.o. male who has bilateral shoulder pain.  He is improved but still has some pain.  The injections helped.  He is taking his medicine and doing his exercises.  He has no redness, no numbness.   Body mass index is 23.15 kg/m.  ROS  Review of Systems  Constitutional: Positive for activity change.  Respiratory: Positive for shortness of breath.   Musculoskeletal: Positive for arthralgias.  Neurological: Positive for seizures.  All other systems reviewed and are negative.   All other systems reviewed and are negative.  The following is a summary of the past history medically, past history surgically, known current medicines, social history and family history.  This information is gathered electronically by the computer from prior information and documentation.  I review this each visit and have found including this information at this point in the chart is beneficial and informative.    Past Medical History:  Diagnosis Date  . Ankle fracture, right 06/2012  . Anxiety   . Arthritis   . Colon polyps    adenomatous polyps on multiple colonoscopies, starting in his early 36s  . COPD (chronic obstructive pulmonary disease) ([place])   . Depression   . History of DVT (deep vein thrombosis)   . Hypertension   . Kidney stones   . Seizures ([place])    has seizures weekly  . Sleep apnea   . Stroke [facility]) 2005   post knee surgery in 2005    Past Surgical History:  Procedure Laterality Date  . BIOPSY N/A 07/22/2012   Procedure: BIOPSY;  Surgeon: Danie Binder, MD;  Location: [facility];  Service: Endoscopy;  Laterality: N/A;  . COLONOSCOPY  06/08/2004   Dr. Rehman:Small polyp cold snared from transverse colon/ Small external hemorrhoids  . COLONOSCOPY  06/16/09   [id number] internal  hemorrhoids/simple adenomas (four polyps removed). Random colon biopsies negative for microscopic colitis. Screening colonoscopy in 5 years with propofol recommended by Dr. Oneida Alar.  . COLONOSCOPY WITH PROPOFOL N/A 07/22/2012   Procedure: COLONOSCOPY WITH PROPOFOL;  Surgeon: Danie Binder, MD;  Location: [facility];  Service: Endoscopy;  Laterality: N/A;  in cecum at 0853, out at 0901total time = 8 minutes  . ESOPHAGOGASTRODUODENOSCOPY  06/16/09   SLF: distal peptic stricture, mild erythema, esophagus dilated to 16 mm. Patchy erythema in the antrum with occasional erosion with active oozing. Patchy erythema in the duodenal bulb. Small bowel biopsies benign. Minimal chronic gastritis with no H. pylori.  . ESOPHAGOGASTRODUODENOSCOPY (EGD) WITH PROPOFOL N/A 07/22/2012   Procedure: ESOPHAGOGASTRODUODENOSCOPY (EGD) WITH PROPOFOL;  Surgeon: Danie Binder, MD;  Location: [facility];  Service: Endoscopy;  Laterality: N/A;  . left knee surgery  2005  . SAVORY DILATION N/A 07/22/2012   Procedure: SAVORY DILATION;  Surgeon: Danie Binder, MD;  Location: [facility];  Service: Endoscopy;  Laterality: N/A;  15, 16, 17    Family History  Problem Relation Age of Onset  . Cancer Father        deceased age 32, metastatic cancer ?stomach  . Colon cancer Mother        diagnosed in her 8s, deceased at 67  . Colon cancer Other        2 maternal uncles and one first cousin  . Breast cancer Mother  Social History Social History   Tobacco Use  . Smoking status: Current Every Day Smoker    Packs/day: 0.25    Years: 40.00    Pack years: 10.00    Types: Cigarettes  . Smokeless tobacco: Never Used  Substance Use Topics  . Alcohol use: No  . Drug use: No    Allergies  Allergen Reactions  . Lortab [Hydrocodone-Acetaminophen] Nausea And Vomiting    Current Outpatient Medications  Medication Sig Dispense Refill  . albuterol (PROVENTIL HFA;VENTOLIN HFA) 108 (90 BASE) MCG/ACT inhaler Inhale 2 puffs into the lungs every  6 (six) hours as needed for wheezing or shortness of breath.    . ALPRAZolam (XANAX) 1 MG tablet Take 1 mg by mouth 4 (four) times daily.     Marland Kitchen FLUoxetine (PROZAC) 40 MG capsule Take 40 mg by mouth every morning.    Marland Kitchen HYDROcodone-acetaminophen (NORCO/VICODIN) 5-325 MG tablet One tablet every four hours as needed for acute pain.  Limit of five days per [place]. 30 tablet 0  . naproxen (NAPROSYN) 500 MG tablet Take 1 tablet (500 mg total) by mouth 2 (two) times daily with a meal. 60 tablet 5  . NEXIUM 40 MG capsule Take 80 mg by mouth daily.     Marland Kitchen oxyCODONE-acetaminophen (PERCOCET/ROXICET) 5-325 MG tablet Take 1 tablet by mouth every 6 (six) hours as needed for up to 7 days. Take one tablet every six hours as needed for pain.  Seven day limit 28 tablet 0   No current facility-administered medications for this visit.      Physical Exam  Blood pressure (!) 170/116, pulse 65, temperature 97.9 F (36.6 C), height 5\' 10"  (1.778 m), weight 161 lb 6 oz (73.2 kg).  Constitutional: overall normal hygiene, normal nutrition, well developed, normal grooming, normal body habitus. Assistive device:none  Musculoskeletal: gait and station Limp none, muscle tone and strength are normal, no tremors or atrophy is present.  .  Neurological: coordination overall normal.  Deep tendon reflex/nerve stretch intact.  Sensation normal.  Cranial nerves II-XII intact.   Skin:   Normal overall no scars, lesions, ulcers or rashes. No psoriasis.  Psychiatric: Alert and oriented x 3.  Recent memory intact, remote memory unclear.  Normal mood and affect. Well groomed.  Good eye contact.  Cardiovascular: overall no swelling, no varicosities, no edema bilaterally, normal temperatures of the legs and arms, no clubbing, cyanosis and good capillary refill.  Lymphatic: palpation is normal.  Both shoulders have good ROM with pain in the extremes.  NV intact.  All other systems reviewed and are negative   The  patient has been educated about the nature of the problem(s) and counseled on treatment options.  The patient appeared to understand what I have discussed and is in agreement with it.  Encounter Diagnoses  Name Primary?  . Acute pain of right shoulder Yes  . Acute pain of left shoulder   . Nicotine dependence, cigarettes, uncomplicated     PLAN Call if any problems.  Precautions discussed.  Continue current medications.   Return to clinic 6 weeks   I have reviewed the [place] web site prior to prescribing narcotic medicine for this patient.   Electronically Signed Sanjuana Kava, MD 11/24/20208:44 AM

## 2019-03-10 NOTE — Patient Instructions (Addendum)
Use Aspercreme, Biofreeze or Voltaren gel over the counter 2-3 times daily make sure you rub it in well each time you use it.    Smoking Tobacco Information, Adult Smoking tobacco can be harmful to your health. Tobacco contains a poisonous (toxic), colorless chemical called nicotine. Nicotine is addictive. It changes the brain and can make it hard to stop smoking. Tobacco also has other toxic chemicals that can hurt your body and raise your risk of many cancers. How can smoking tobacco affect me? Smoking tobacco puts you at risk for:  Cancer. Smoking is most commonly associated with lung cancer, but can also lead to cancer in other parts of the body.  Chronic obstructive pulmonary disease (COPD). This is a long-term lung condition that makes it hard to breathe. It also gets worse over time.  High blood pressure (hypertension), heart disease, stroke, or heart attack.  Lung infections, such as pneumonia.  Cataracts. This is when the lenses in the eyes become clouded.  Digestive problems. This may include peptic ulcers, heartburn, and gastroesophageal reflux disease (GERD).  Oral health problems, such as gum disease and tooth loss.  Loss of taste and smell. Smoking can affect your appearance by causing:  Wrinkles.  Yellow or stained teeth, fingers, and fingernails. Smoking tobacco can also affect your social life, because:  It may be challenging to find places to smoke when away from home. Many workplaces, [employer], hotels, and public places are tobacco-free.  Smoking is expensive. This is due to the cost of tobacco and the long-term costs of treating health problems from smoking.  Secondhand smoke may affect those around you. Secondhand smoke can cause lung cancer, breathing problems, and heart disease. Children of smokers have a higher risk for: ? Sudden infant death syndrome (SIDS). ? Ear infections. ? Lung infections. If you currently smoke tobacco, quitting now can help  you:  Lead a longer and healthier life.  Look, smell, breathe, and feel better over time.  Save money.  Protect others from the harms of secondhand smoke. What actions can I take to prevent health problems? Quit smoking   Do not start smoking. Quit if you already do.  Make a plan to quit smoking and commit to it. Look for programs to help you and ask your health care provider for recommendations and ideas.  Set a date and write down all the reasons you want to quit.  Let your friends and family know you are quitting so they can help and support you. Consider finding friends who also want to quit. It can be easier to quit with someone else, so that you can support each other.  Talk with your health care provider about using nicotine replacement medicines to help you quit, such as gum, lozenges, patches, sprays, or pills.  Do not replace cigarette smoking with electronic cigarettes, which are commonly called e-cigarettes. The safety of e-cigarettes is not known, and some may contain harmful chemicals.  If you try to quit but return to smoking, stay positive. It is common to slip up when you first quit, so take it one day at a time.  Be prepared for cravings. When you feel the urge to smoke, chew gum or suck on hard candy. Lifestyle  Stay busy and take care of your body.  Drink enough fluid to keep your urine pale yellow.  Get plenty of exercise and eat a healthy diet. This can help prevent weight gain after quitting.  Monitor your eating habits. Quitting smoking can cause  you to have a larger appetite than when you smoke.  Find ways to relax. Go out with friends or family to a movie or a restaurant where people do not smoke.  Ask your health care provider about having regular tests (screenings) to check for cancer. This may include blood tests, imaging tests, and other tests.  Find ways to manage your stress, such as meditation, yoga, or exercise. Where to find support To  get support to quit smoking, consider:  Asking your health care provider for more information and resources.  Taking classes to learn more about quitting smoking.  Looking for local organizations that offer resources about quitting smoking.  Joining a support group for people who want to quit smoking in your local community.  Calling the smokefree.gov counselor helpline: 1-800-Quit-Now (289)107-3442) Where to find more information You may find more information about quitting smoking from:  HelpGuide.org: www.helpguide.org  https://hall.com/: smokefree.gov  American Lung Association: www.lung.org Contact a health care provider if you:  Have problems breathing.  Notice that your lips, nose, or fingers turn blue.  Have chest pain.  Are coughing up blood.  Feel faint or you pass out.  Have other health changes that cause you to worry. Summary  Smoking tobacco can negatively affect your health, the health of those around you, your finances, and your social life.  Do not start smoking. Quit if you already do. If you need help quitting, ask your health care provider.  Think about joining a support group for people who want to quit smoking in your local community. There are many effective programs that will help you to quit this behavior. This information is not intended to replace advice given to you by your health care provider. Make sure you discuss any questions you have with your health care provider. Document Released: 04/17/2016 Document Revised: 05/22/2017 Document Reviewed: 04/17/2016 Elsevier Patient Education  2020 [employer].

## 2019-03-30 ENCOUNTER — Telehealth: Payer: Self-pay | Admitting: Orthopaedic Surgery

## 2019-03-30 NOTE — Telephone Encounter (Signed)
Patient called this morning asking for a sooner appointment date than 04/21/19 as "shoulder is killing him." Relayed that Dr Luna Glasgow is out of office this week, and returning to office first of next week. Asking if that is too soon for an injection?

## 2019-04-04 NOTE — Telephone Encounter (Signed)
I can see

## 2019-04-06 NOTE — Telephone Encounter (Signed)
Called back to alternate phone, designated contact as primary number not correct. No option for voice message to be left.

## 2019-04-06 NOTE — Telephone Encounter (Signed)
Called patient to offer appointment; reached voice mail; left message.

## 2019-04-21 ENCOUNTER — Other Ambulatory Visit: Payer: Self-pay

## 2019-04-21 ENCOUNTER — Ambulatory Visit (INDEPENDENT_AMBULATORY_CARE_PROVIDER_SITE_OTHER): Payer: Medicare Other | Admitting: Orthopaedic Surgery

## 2019-04-21 ENCOUNTER — Encounter: Payer: Self-pay | Admitting: Orthopaedic Surgery

## 2019-04-21 VITALS — Temp 97.0°F | Ht 70.0 in | Wt 164.1 lb

## 2019-04-21 DIAGNOSIS — G8929 Other chronic pain: Secondary | ICD-10-CM

## 2019-04-21 DIAGNOSIS — M25511 Pain in right shoulder: Secondary | ICD-10-CM | POA: Diagnosis not present

## 2019-04-21 DIAGNOSIS — F17210 Nicotine dependence, cigarettes, uncomplicated: Secondary | ICD-10-CM

## 2019-04-21 MED ORDER — DICLOFENAC SODIUM 75 MG PO TBEC: 75.0000 mg | DELAYED_RELEASE_TABLET | Freq: Two times a day (BID) | ORAL | 2 refills | Status: DC

## 2019-04-21 MED ORDER — OXYCODONE-ACETAMINOPHEN 5-325 MG PO TABS: 1.0000 | ORAL_TABLET | Freq: Four times a day (QID) | ORAL | 0 refills | Status: AC | PRN

## 2019-04-21 NOTE — Progress Notes (Signed)
PROCEDURE NOTE:  The patient request injection, verbal consent was obtained.  The right shoulder was prepped appropriately after time out was performed.   Sterile technique was observed and injection of 1 cc of Depo-Medrol 40 mg with several cc's of plain xylocaine. Anesthesia was provided by ethyl chloride and a 20-gauge needle was used to inject the shoulder area. A posterior approach was used.  The injection was tolerated well.  A band aid dressing was applied.  The patient was advised to apply ice later today and tomorrow to the injection sight as needed.  I will stop the Naprosyn and begin diclofenac.  I have reviewed the [place] web site prior to prescribing narcotic medicine for this patient.   Return in one month.  Electronically Signed Sanjuana Kava, MD 1/5/20218:45 AM

## 2019-04-21 NOTE — Patient Instructions (Signed)

## 2019-05-21 ENCOUNTER — Encounter: Payer: Self-pay | Admitting: Orthopaedic Surgery

## 2019-05-21 ENCOUNTER — Other Ambulatory Visit: Payer: Self-pay

## 2019-05-21 ENCOUNTER — Ambulatory Visit (INDEPENDENT_AMBULATORY_CARE_PROVIDER_SITE_OTHER): Payer: Medicare Other | Admitting: Orthopaedic Surgery

## 2019-05-21 VITALS — BP 196/121 | HR 60 | Temp 97.1°F | Ht 70.0 in | Wt 160.0 lb

## 2019-05-21 DIAGNOSIS — G8929 Other chronic pain: Secondary | ICD-10-CM

## 2019-05-21 DIAGNOSIS — M25511 Pain in right shoulder: Secondary | ICD-10-CM | POA: Diagnosis not present

## 2019-05-21 MED ORDER — OXYCODONE-ACETAMINOPHEN 5-325 MG PO TABS: 1.0000 | ORAL_TABLET | Freq: Four times a day (QID) | ORAL | 0 refills | Status: AC | PRN

## 2019-05-21 NOTE — Patient Instructions (Signed)

## 2019-05-21 NOTE — Progress Notes (Signed)
PROCEDURE NOTE:  The patient request injection, verbal consent was obtained.  The right shoulder was prepped appropriately after time out was performed.   Sterile technique was observed and injection of 1 cc of Depo-Medrol 40 mg with several cc's of plain xylocaine. Anesthesia was provided by ethyl chloride and a 20-gauge needle was used to inject the shoulder area. A posterior approach was used.  The injection was tolerated well.  A band aid dressing was applied.  The patient was advised to apply ice later today and tomorrow to the injection sight as needed.  I have reviewed the [place] web site prior to prescribing narcotic medicine for this patient.   I will get MRI of the right shoulder.  I am concerned about a rotator cuff tear.  Electronically Signed Sanjuana Kava, MD 2/4/20218:43 AM

## 2019-06-02 ENCOUNTER — Ambulatory Visit (HOSPITAL_COMMUNITY)
Admission: RE | Admit: 2019-06-02 | Discharge: 2019-06-02 | Disposition: A | Discharge status: Home / Self Care | Payer: Medicare Other | Source: Ambulatory Visit | Attending: Orthopaedic Surgery | Admitting: Orthopaedic Surgery

## 2019-06-02 ENCOUNTER — Other Ambulatory Visit: Payer: Self-pay

## 2019-06-02 DIAGNOSIS — M25511 Pain in right shoulder: Secondary | ICD-10-CM | POA: Insufficient documentation

## 2019-06-02 DIAGNOSIS — G8929 Other chronic pain: Secondary | ICD-10-CM | POA: Insufficient documentation

## 2019-06-03 ENCOUNTER — Encounter: Payer: Self-pay | Admitting: Orthopaedic Surgery

## 2019-06-03 ENCOUNTER — Ambulatory Visit (INDEPENDENT_AMBULATORY_CARE_PROVIDER_SITE_OTHER): Payer: Medicare Other | Admitting: Orthopaedic Surgery

## 2019-06-03 VITALS — Temp 98.2°F | Ht 70.0 in | Wt 163.1 lb

## 2019-06-03 DIAGNOSIS — G8929 Other chronic pain: Secondary | ICD-10-CM | POA: Diagnosis not present

## 2019-06-03 DIAGNOSIS — M25511 Pain in right shoulder: Secondary | ICD-10-CM

## 2019-06-03 NOTE — Progress Notes (Signed)
Patient [id number] Adam Wheeler, male DOB:09/17/1955, 64 y.o. [id number]  Chief Complaint  Patient presents with  . Shoulder Pain    R/here to go over MRI/Some better    HPI  Adam Wheeler is a 64 y.o. male who has shoulder pain on the right.  He had MRI which showed: IMPRESSION: 1. Moderate rotator cuff tendinopathy/tendinosis. No full-thickness retracted rotator cuff tear. 2. Significant tendinopathy and longitudinal split type tear involving the long head biceps tendon. 3. Moderate AC joint degenerative changes and type 3 acromion may contribute to bony impingement. 4. Moderate glenohumeral joint degenerative changes with small joint effusion and mild synovitis versus adhesive capsulitis. 5. Mild subacromial/subdeltoid bursitis.  I have explained the findings to him.  I would not recommend surgery at this time.  I will begin PT.   Body mass index is 23.41 kg/m.  ROS  Review of Systems  Constitutional: Positive for activity change.  Respiratory: Positive for shortness of breath.   Musculoskeletal: Positive for arthralgias.  Neurological: Positive for seizures.  All other systems reviewed and are negative.   All other systems reviewed and are negative.  The following is a summary of the past history medically, past history surgically, known current medicines, social history and family history.  This information is gathered electronically by the computer from prior information and documentation.  I review this each visit and have found including this information at this point in the chart is beneficial and informative.    Past Medical History:  Diagnosis Date  . Ankle fracture, right 06/2012  . Anxiety   . Arthritis   . Colon polyps    adenomatous polyps on multiple colonoscopies, starting in his early 54s  . COPD (chronic obstructive pulmonary disease) ([place])   . Depression   . History of DVT (deep vein thrombosis)   . Hypertension   . Kidney stones   . Seizures  ([place])    has seizures weekly  . Sleep apnea   . Stroke [facility]) 2005   post knee surgery in 2005    Past Surgical History:  Procedure Laterality Date  . BIOPSY N/A 07/22/2012   Procedure: BIOPSY;  Surgeon: Danie Binder, MD;  Location: [facility];  Service: Endoscopy;  Laterality: N/A;  . COLONOSCOPY  06/08/2004   Dr. Rehman:Small polyp cold snared from transverse colon/ Small external hemorrhoids  . COLONOSCOPY  06/16/09   [id number] internal hemorrhoids/simple adenomas (four polyps removed). Random colon biopsies negative for microscopic colitis. Screening colonoscopy in 5 years with propofol recommended by Dr. Oneida Alar.  . COLONOSCOPY WITH PROPOFOL N/A 07/22/2012   Procedure: COLONOSCOPY WITH PROPOFOL;  Surgeon: Danie Binder, MD;  Location: [facility];  Service: Endoscopy;  Laterality: N/A;  in cecum at 0853, out at 0901total time = 8 minutes  . ESOPHAGOGASTRODUODENOSCOPY  06/16/09   SLF: distal peptic stricture, mild erythema, esophagus dilated to 16 mm. Patchy erythema in the antrum with occasional erosion with active oozing. Patchy erythema in the duodenal bulb. Small bowel biopsies benign. Minimal chronic gastritis with no H. pylori.  . ESOPHAGOGASTRODUODENOSCOPY (EGD) WITH PROPOFOL N/A 07/22/2012   Procedure: ESOPHAGOGASTRODUODENOSCOPY (EGD) WITH PROPOFOL;  Surgeon: Danie Binder, MD;  Location: [facility];  Service: Endoscopy;  Laterality: N/A;  . left knee surgery  2005  . SAVORY DILATION N/A 07/22/2012   Procedure: SAVORY DILATION;  Surgeon: Danie Binder, MD;  Location: [facility];  Service: Endoscopy;  Laterality: N/A;  15, 16, 17    Family History  Problem Relation Age  of Onset  . Cancer Father        deceased age 35, metastatic cancer ?stomach  . Colon cancer Mother        diagnosed in her 50s, deceased at 31  . Colon cancer Other        2 maternal uncles and one first cousin  . Breast cancer Mother     Social History Social History   Tobacco Use  . Smoking status: Current Every Day Smoker     Packs/day: 0.25    Years: 40.00    Pack years: 10.00    Types: Cigarettes  . Smokeless tobacco: Never Used  Substance Use Topics  . Alcohol use: No  . Drug use: No    Allergies  Allergen Reactions  . Lortab [Hydrocodone-Acetaminophen] Nausea And Vomiting    Current Outpatient Medications  Medication Sig Dispense Refill  . albuterol (PROVENTIL HFA;VENTOLIN HFA) 108 (90 BASE) MCG/ACT inhaler Inhale 2 puffs into the lungs every 6 (six) hours as needed for wheezing or shortness of breath.    . ALPRAZolam (XANAX) 1 MG tablet Take 1 mg by mouth 4 (four) times daily.     . diclofenac (VOLTAREN) 75 MG EC tablet Take 1 tablet (75 mg total) by mouth 2 (two) times daily with a meal. 60 tablet 2  . FLUoxetine (PROZAC) 40 MG capsule Take 40 mg by mouth every morning.    Marland Kitchen NEXIUM 40 MG capsule Take 80 mg by mouth daily.      No current facility-administered medications for this visit.     Physical Exam  Temperature 98.2 F (36.8 C), height 5\' 10"  (1.778 m), weight 163 lb 2 oz (74 kg).  Constitutional: overall normal hygiene, normal nutrition, well developed, normal grooming, normal body habitus. Assistive device:none  Musculoskeletal: gait and station Limp none, muscle tone and strength are normal, no tremors or atrophy is present.  .  Neurological: coordination overall normal.  Deep tendon reflex/nerve stretch intact.  Sensation normal.  Cranial nerves II-XII intact.   Skin:   Normal overall no scars, lesions, ulcers or rashes. No psoriasis.  Psychiatric: Alert and oriented x 3.  Recent memory intact, remote memory unclear.  Normal mood and affect. Well groomed.  Good eye contact.  Cardiovascular: overall no swelling, no varicosities, no edema bilaterally, normal temperatures of the legs and arms, no clubbing, cyanosis and good capillary refill.  Lymphatic: palpation is normal.  Right shoulder has near full motion but pain at the extremes.  NV intact.   All other systems  reviewed and are negative   The patient has been educated about the nature of the problem(s) and counseled on treatment options.  The patient appeared to understand what I have discussed and is in agreement with it.  Encounter Diagnosis  Name Primary?  . Chronic right shoulder pain Yes    PLAN Call if any problems.  Precautions discussed.  Continue current medications.   Return to clinic 3 weeks   Begin PT in [place].  Electronically Signed Sanjuana Kava, MD 2/17/20212:52 PM

## 2019-06-03 NOTE — Patient Instructions (Signed)

## 2019-06-04 ENCOUNTER — Ambulatory Visit: Payer: Medicare Other | Admitting: Orthopaedic Surgery

## 2019-06-17 ENCOUNTER — Encounter: Payer: Self-pay | Admitting: Radiology

## 2019-06-18 ENCOUNTER — Telehealth: Payer: Self-pay | Admitting: Orthopaedic Surgery

## 2019-06-18 NOTE — Telephone Encounter (Signed)
Patient requests refill also on medication: oxyCODONE-acetaminophen (PERCOCET/ROXICET) 5-325 MG tablet   - [employer]

## 2019-06-18 NOTE — Telephone Encounter (Signed)
Patient called for refill, diclofenac (VOLTAREN) 75 MG EC tablet 60 tablet  -[employer]

## 2019-06-22 MED ORDER — OXYCODONE-ACETAMINOPHEN 5-325 MG PO TABS: ORAL_TABLET | ORAL | 0 refills | Status: DC

## 2019-06-25 ENCOUNTER — Ambulatory Visit (INDEPENDENT_AMBULATORY_CARE_PROVIDER_SITE_OTHER): Payer: Medicare Other | Admitting: Orthopaedic Surgery

## 2019-06-25 ENCOUNTER — Other Ambulatory Visit: Payer: Self-pay

## 2019-06-25 ENCOUNTER — Encounter: Payer: Self-pay | Admitting: Orthopaedic Surgery

## 2019-06-25 VITALS — BP 177/105 | HR 65 | Temp 97.3°F | Ht 70.0 in | Wt 165.0 lb

## 2019-06-25 DIAGNOSIS — G8929 Other chronic pain: Secondary | ICD-10-CM | POA: Diagnosis not present

## 2019-06-25 DIAGNOSIS — M25511 Pain in right shoulder: Secondary | ICD-10-CM

## 2019-06-25 MED ORDER — DICLOFENAC SODIUM 75 MG PO TBEC: 75.0000 mg | DELAYED_RELEASE_TABLET | Freq: Two times a day (BID) | ORAL | 5 refills | Status: DC

## 2019-06-25 NOTE — Progress Notes (Signed)
PROCEDURE NOTE:  The patient request injection, verbal consent was obtained.  The right shoulder was prepped appropriately after time out was performed.   Sterile technique was observed and injection of 1 cc of Depo-Medrol 40 mg with several cc's of plain xylocaine. Anesthesia was provided by ethyl chloride and a 20-gauge needle was used to inject the shoulder area. A posterior approach was used.  The injection was tolerated well.  A band aid dressing was applied.  The patient was advised to apply ice later today and tomorrow to the injection sight as needed.  I refilled his diclofenac.  Return in six weeks.  Call if any problem.  Precautions discussed.   Electronically Signed Sanjuana Kava, MD 3/11/20218:42 AM

## 2019-07-21 ENCOUNTER — Telehealth: Payer: Self-pay

## 2019-07-21 NOTE — Telephone Encounter (Signed)
Oxycodone-Acetaminophen  5/325 mg  Qty 25 Tablets  PATIENT USES [facility]

## 2019-07-23 MED ORDER — OXYCODONE-ACETAMINOPHEN 5-325 MG PO TABS: ORAL_TABLET | ORAL | 0 refills | Status: DC

## 2019-08-06 ENCOUNTER — Encounter: Payer: Self-pay | Admitting: Orthopaedic Surgery

## 2019-08-06 ENCOUNTER — Ambulatory Visit (INDEPENDENT_AMBULATORY_CARE_PROVIDER_SITE_OTHER): Payer: Medicare Other | Admitting: Orthopaedic Surgery

## 2019-08-06 ENCOUNTER — Other Ambulatory Visit: Payer: Self-pay

## 2019-08-06 DIAGNOSIS — J449 Chronic obstructive pulmonary disease, unspecified: Secondary | ICD-10-CM | POA: Insufficient documentation

## 2019-08-06 DIAGNOSIS — M25511 Pain in right shoulder: Secondary | ICD-10-CM

## 2019-08-06 DIAGNOSIS — F419 Anxiety disorder, unspecified: Secondary | ICD-10-CM | POA: Insufficient documentation

## 2019-08-06 DIAGNOSIS — G43909 Migraine, unspecified, not intractable, without status migrainosus: Secondary | ICD-10-CM | POA: Insufficient documentation

## 2019-08-06 DIAGNOSIS — F17210 Nicotine dependence, cigarettes, uncomplicated: Secondary | ICD-10-CM

## 2019-08-06 DIAGNOSIS — G8929 Other chronic pain: Secondary | ICD-10-CM

## 2019-08-06 MED ORDER — OXYCODONE-ACETAMINOPHEN 5-325 MG PO TABS: ORAL_TABLET | ORAL | 0 refills | Status: DC

## 2019-08-06 NOTE — Progress Notes (Signed)
PROCEDURE NOTE:  The patient request injection, verbal consent was obtained.  The right shoulder was prepped appropriately after time out was performed.   Sterile technique was observed and injection of 1 cc of Depo-Medrol 40 mg with several cc's of plain xylocaine. Anesthesia was provided by ethyl chloride and a 20-gauge needle was used to inject the shoulder area. A posterior approach was used.  The injection was tolerated well.  A band aid dressing was applied.  The patient was advised to apply ice later today and tomorrow to the injection sight as needed.  Return in six weeks.  I have reviewed the [place] web site prior to prescribing narcotic medicine for this patient.   Encounter Diagnoses  Name Primary?  . Chronic right shoulder pain Yes  . Nicotine dependence, cigarettes, uncomplicated    Electronically Signed Sanjuana Kava, MD 4/22/20218:07 AM

## 2019-08-06 NOTE — Patient Instructions (Signed)
Steps to Quit Smoking Smoking tobacco is the leading cause of preventable death. It can affect almost every organ in the body. Smoking puts you and people around you at risk for many serious, long-lasting (chronic) diseases. Quitting smoking can be hard, but it is one of the best things that you can do for your health. It is never too late to quit. How do I get ready to quit? When you decide to quit smoking, make a plan to help you succeed. Before you quit:  Pick a date to quit. Set a date within the next 2 weeks to give you time to prepare.  Write down the reasons why you are quitting. Keep this list in places where you will see it often.  Tell your family, friends, and co-workers that you are quitting. Their support is important.  Talk with your doctor about the choices that may help you quit.  Find out if your health insurance will pay for these treatments.  Know the people, places, things, and activities that make you want to smoke (triggers). Avoid them. What first steps can I take to quit smoking?  Throw away all cigarettes at home, at work, and in your car.  Throw away the things that you use when you smoke, such as ashtrays and lighters.  Clean your car. Make sure to empty the ashtray.  Clean your home, including curtains and carpets. What can I do to help me quit smoking? Talk with your doctor about taking medicines and seeing a counselor at the same time. You are more likely to succeed when you do both.  If you are pregnant or breastfeeding, talk with your doctor about counseling or other ways to quit smoking. Do not take medicine to help you quit smoking unless your doctor tells you to do so. To quit smoking: Quit right away  Quit smoking totally, instead of slowly cutting back on how much you smoke over a period of time.  Go to counseling. You are more likely to quit if you go to counseling sessions regularly. Take medicine You may take medicines to help you quit. Some  medicines need a prescription, and some you can buy over-the-counter. Some medicines may contain a drug called nicotine to replace the nicotine in cigarettes. Medicines may:  Help you to stop having the desire to smoke (cravings).  Help to stop the problems that come when you stop smoking (withdrawal symptoms). Your doctor may ask you to use:  Nicotine patches, gum, or lozenges.  Nicotine inhalers or sprays.  Non-nicotine medicine that is taken by mouth. Find resources Find resources and other ways to help you quit smoking and remain smoke-free after you quit. These resources are most helpful when you use them often. They include:  Online chats with a counselor.  Phone quitlines.  Printed self-help materials.  Support groups or group counseling.  Text messaging programs.  Mobile phone apps. Use apps on your mobile phone or tablet that can help you stick to your quit plan. There are many free apps for mobile phones and tablets as well as websites. Examples include Quit Guide from the CDC and smokefree.gov  What things can I do to make it easier to quit?   Talk to your family and friends. Ask them to support and encourage you.  Call a phone quitline (1-800-QUIT-NOW), reach out to support groups, or work with a counselor.  Ask people who smoke to not smoke around you.  Avoid places that make you want to smoke,   such as: ? Bars. ? Parties. ? Smoke-break areas at work.  Spend time with people who do not smoke.  Lower the stress in your life. Stress can make you want to smoke. Try these things to help your stress: ? Getting regular exercise. ? Doing deep-breathing exercises. ? Doing yoga. ? Meditating. ? Doing a body scan. To do this, close your eyes, focus on one area of your body at a time from head to toe. Notice which parts of your body are tense. Try to relax the muscles in those areas. How will I feel when I quit smoking? Day 1 to 3 weeks Within the first 24 hours,  you may start to have some problems that come from quitting tobacco. These problems are very bad 2-3 days after you quit, but they do not often last for more than 2-3 weeks. You may get these symptoms:  Mood swings.  Feeling restless, nervous, angry, or annoyed.  Trouble concentrating.  Dizziness.  Strong desire for high-sugar foods and nicotine.  Weight gain.  Trouble pooping (constipation).  Feeling like you may vomit (nausea).  Coughing or a sore throat.  Changes in how the medicines that you take for other issues work in your body.  Depression.  Trouble sleeping (insomnia). Week 3 and afterward After the first 2-3 weeks of quitting, you may start to notice more positive results, such as:  Better sense of smell and taste.  Less coughing and sore throat.  Slower heart rate.  Lower blood pressure.  Clearer skin.  Better breathing.  Fewer sick days. Quitting smoking can be hard. Do not give up if you fail the first time. Some people need to try a few times before they succeed. Do your best to stick to your quit plan, and talk with your doctor if you have any questions or concerns. Summary  Smoking tobacco is the leading cause of preventable death. Quitting smoking can be hard, but it is one of the best things that you can do for your health.  When you decide to quit smoking, make a plan to help you succeed.  Quit smoking right away, not slowly over a period of time.  When you start quitting, seek help from your doctor, family, or friends. This information is not intended to replace advice given to you by your health care provider. Make sure you discuss any questions you have with your health care provider. Document Revised: 12/26/2018 Document Reviewed: 06/21/2018 Elsevier Patient Education  2020 Elsevier Inc.  

## 2019-08-27 ENCOUNTER — Institutional Professional Consult (permissible substitution): Payer: Medicare Other | Admitting: Pulmonary Disease

## 2019-09-17 ENCOUNTER — Ambulatory Visit (INDEPENDENT_AMBULATORY_CARE_PROVIDER_SITE_OTHER): Payer: Medicare Other | Admitting: Orthopaedic Surgery

## 2019-09-17 ENCOUNTER — Encounter: Payer: Self-pay | Admitting: Orthopaedic Surgery

## 2019-09-17 ENCOUNTER — Other Ambulatory Visit: Payer: Self-pay

## 2019-09-17 DIAGNOSIS — G8929 Other chronic pain: Secondary | ICD-10-CM

## 2019-09-17 DIAGNOSIS — M25511 Pain in right shoulder: Secondary | ICD-10-CM

## 2019-09-17 DIAGNOSIS — F17210 Nicotine dependence, cigarettes, uncomplicated: Secondary | ICD-10-CM

## 2019-09-17 MED ORDER — OXYCODONE-ACETAMINOPHEN 5-325 MG PO TABS: ORAL_TABLET | ORAL | 0 refills | Status: DC

## 2019-09-17 NOTE — Patient Instructions (Signed)
Steps to Quit Smoking Smoking tobacco is the leading cause of preventable death. It can affect almost every organ in the body. Smoking puts you and people around you at risk for many serious, long-lasting (chronic) diseases. Quitting smoking can be hard, but it is one of the best things that you can do for your health. It is never too late to quit. How do I get ready to quit? When you decide to quit smoking, make a plan to help you succeed. Before you quit:  Pick a date to quit. Set a date within the next 2 weeks to give you time to prepare.  Write down the reasons why you are quitting. Keep this list in places where you will see it often.  Tell your family, friends, and co-workers that you are quitting. Their support is important.  Talk with your doctor about the choices that may help you quit.  Find out if your health insurance will pay for these treatments.  Know the people, places, things, and activities that make you want to smoke (triggers). Avoid them. What first steps can I take to quit smoking?  Throw away all cigarettes at home, at work, and in your car.  Throw away the things that you use when you smoke, such as ashtrays and lighters.  Clean your car. Make sure to empty the ashtray.  Clean your home, including curtains and carpets. What can I do to help me quit smoking? Talk with your doctor about taking medicines and seeing a counselor at the same time. You are more likely to succeed when you do both.  If you are pregnant or breastfeeding, talk with your doctor about counseling or other ways to quit smoking. Do not take medicine to help you quit smoking unless your doctor tells you to do so. To quit smoking: Quit right away  Quit smoking totally, instead of slowly cutting back on how much you smoke over a period of time.  Go to counseling. You are more likely to quit if you go to counseling sessions regularly. Take medicine You may take medicines to help you quit. Some  medicines need a prescription, and some you can buy over-the-counter. Some medicines may contain a drug called nicotine to replace the nicotine in cigarettes. Medicines may:  Help you to stop having the desire to smoke (cravings).  Help to stop the problems that come when you stop smoking (withdrawal symptoms). Your doctor may ask you to use:  Nicotine patches, gum, or lozenges.  Nicotine inhalers or sprays.  Non-nicotine medicine that is taken by mouth. Find resources Find resources and other ways to help you quit smoking and remain smoke-free after you quit. These resources are most helpful when you use them often. They include:  Online chats with a counselor.  Phone quitlines.  Printed self-help materials.  Support groups or group counseling.  Text messaging programs.  Mobile phone apps. Use apps on your mobile phone or tablet that can help you stick to your quit plan. There are many free apps for mobile phones and tablets as well as websites. Examples include Quit Guide from the CDC and smokefree.gov  What things can I do to make it easier to quit?   Talk to your family and friends. Ask them to support and encourage you.  Call a phone quitline (1-800-QUIT-NOW), reach out to support groups, or work with a counselor.  Ask people who smoke to not smoke around you.  Avoid places that make you want to smoke,   such as: ? Bars. ? Parties. ? Smoke-break areas at work.  Spend time with people who do not smoke.  Lower the stress in your life. Stress can make you want to smoke. Try these things to help your stress: ? Getting regular exercise. ? Doing deep-breathing exercises. ? Doing yoga. ? Meditating. ? Doing a body scan. To do this, close your eyes, focus on one area of your body at a time from head to toe. Notice which parts of your body are tense. Try to relax the muscles in those areas. How will I feel when I quit smoking? Day 1 to 3 weeks Within the first 24 hours,  you may start to have some problems that come from quitting tobacco. These problems are very bad 2-3 days after you quit, but they do not often last for more than 2-3 weeks. You may get these symptoms:  Mood swings.  Feeling restless, nervous, angry, or annoyed.  Trouble concentrating.  Dizziness.  Strong desire for high-sugar foods and nicotine.  Weight gain.  Trouble pooping (constipation).  Feeling like you may vomit (nausea).  Coughing or a sore throat.  Changes in how the medicines that you take for other issues work in your body.  Depression.  Trouble sleeping (insomnia). Week 3 and afterward After the first 2-3 weeks of quitting, you may start to notice more positive results, such as:  Better sense of smell and taste.  Less coughing and sore throat.  Slower heart rate.  Lower blood pressure.  Clearer skin.  Better breathing.  Fewer sick days. Quitting smoking can be hard. Do not give up if you fail the first time. Some people need to try a few times before they succeed. Do your best to stick to your quit plan, and talk with your doctor if you have any questions or concerns. Summary  Smoking tobacco is the leading cause of preventable death. Quitting smoking can be hard, but it is one of the best things that you can do for your health.  When you decide to quit smoking, make a plan to help you succeed.  Quit smoking right away, not slowly over a period of time.  When you start quitting, seek help from your doctor, family, or friends. This information is not intended to replace advice given to you by your health care provider. Make sure you discuss any questions you have with your health care provider. Document Revised: 12/26/2018 Document Reviewed: 06/21/2018 Elsevier Patient Education  2020 Elsevier Inc.  

## 2019-09-17 NOTE — Progress Notes (Signed)
PROCEDURE NOTE:  The patient request injection, verbal consent was obtained.  The right shoulder was prepped appropriately after time out was performed.   Sterile technique was observed and injection of 1 cc of Depo-Medrol 40 mg with several cc's of plain xylocaine. Anesthesia was provided by ethyl chloride and a 20-gauge needle was used to inject the shoulder area. A posterior approach was used.  The injection was tolerated well.  A band aid dressing was applied.  The patient was advised to apply ice later today and tomorrow to the injection sight as needed.  Return in six weeks.  I have reviewed the [place] web site prior to prescribing narcotic medicine for this patient.   Electronically Signed Sanjuana Kava, MD 6/3/20218:13 AM

## 2019-10-01 ENCOUNTER — Other Ambulatory Visit: Payer: Self-pay

## 2019-10-01 ENCOUNTER — Ambulatory Visit (INDEPENDENT_AMBULATORY_CARE_PROVIDER_SITE_OTHER): Payer: Medicare Other | Admitting: Internal Medicine

## 2019-10-01 ENCOUNTER — Encounter: Payer: Self-pay | Admitting: Internal Medicine

## 2019-10-01 DIAGNOSIS — F17210 Nicotine dependence, cigarettes, uncomplicated: Secondary | ICD-10-CM | POA: Insufficient documentation

## 2019-10-01 DIAGNOSIS — J449 Chronic obstructive pulmonary disease, unspecified: Secondary | ICD-10-CM

## 2019-10-01 DIAGNOSIS — K219 Gastro-esophageal reflux disease without esophagitis: Secondary | ICD-10-CM | POA: Insufficient documentation

## 2019-10-01 MED ORDER — FAMOTIDINE 20 MG PO TABS: ORAL_TABLET | ORAL | 11 refills | Status: DC

## 2019-10-01 MED ORDER — TRELEGY ELLIPTA 100-62.5-25 MCG/INH IN AEPB: 1.0000 | INHALATION_SPRAY | Freq: Every day | RESPIRATORY_TRACT | 0 refills | Status: DC

## 2019-10-01 NOTE — Progress Notes (Signed)
ROSIE GOLSON, male    DOB: [birth year]    MRN: [id number]   Brief patient profile:  64 yowm  Active smoker with new onset doe x 2017 eval by [name] and [name] dx copd and rx trelegy self referred to the pulmonary clinic in [place] 10/01/2019    History of Present Illness  10/01/2019  Pulmonary/ 1st office eval/Johnni Wunschel  Chief Complaint  Patient presents with  . Pulmonary Consult    Former pt of Dr [name]- COPD. He states today his breathing is "average"- needing samples of trelegy. He states this helps his SOB more than anything.   Dyspnea:  MMRC3 = can't walk 100 yards even at a slow pace at a flat grade s stopping due to sob  Difficulty with [employer] even going slowly and not checking sats/ worse since ran out of trelegy  Cough: none  Sleep: ok 30-45 degrees due to gerd not cough or sob  SABA use: once or twice a week  No obvious day to day or daytime variability or assoc excess/ purulent sputum or mucus plugs or hemoptysis or cp or chest tightness, subjective wheeze or overt sinus  symptoms.   Sleeping as above without nocturnal  or early am exacerbation  of respiratory  c/o's or need for noct saba. Also denies any obvious fluctuation of symptoms with weather or environmental changes or other aggravating or alleviating factors except as outlined above   No unusual exposure hx or h/o childhood pna/ asthma or knowledge of premature birth.  Current Allergies, Complete Past Medical History, Past Surgical History, Family History, and Social History were reviewed in [employer] record.  ROS  The following are not active complaints unless bolded Hoarseness, sore throat, dysphagia, dental problems, itching, sneezing,  nasal congestion or discharge of excess mucus or purulent secretions, ear ache,   fever, chills, sweats, unintended wt loss or wt gain, classically pleuritic or exertional cp,  orthopnea pnd or arm/hand swelling  or leg swelling, presyncope,  palpitations, abdominal pain, anorexia, nausea, vomiting, diarrhea  or change in bowel habits or change in bladder habits, change in stools or change in urine, dysuria, hematuria,  rash, arthralgias, visual complaints, headache, numbness, weakness or ataxia or problems with walking or coordination,  change in mood or  memory.           Past Medical History:  Diagnosis Date  . Ankle fracture, right 06/2012  . Anxiety   . Arthritis   . Colon polyps    adenomatous polyps on multiple colonoscopies, starting in his early 43s  . COPD (chronic obstructive pulmonary disease) ([place])   . Depression   . History of DVT (deep vein thrombosis)   . Hypertension   . Kidney stones   . Seizures ([place])    has seizures weekly  . Sleep apnea   . Stroke [facility]) 2005   post knee surgery in 2005    Outpatient Medications Prior to Visit  Medication Sig Dispense Refill  . albuterol (PROVENTIL HFA;VENTOLIN HFA) 108 (90 BASE) MCG/ACT inhaler Inhale 2 puffs into the lungs every 6 (six) hours as needed for wheezing or shortness of breath.    . ARIPiprazole (ABILIFY) 2 MG tablet Take 2 mg by mouth daily.    . diazepam (VALIUM) 10 MG tablet     . diclofenac (VOLTAREN) 75 MG EC tablet Take 1 tablet (75 mg total) by mouth 2 (two) times daily with a meal. 60 tablet 5  . FLUoxetine (PROZAC) 40 MG capsule  Take 40 mg by mouth every morning.    Marland Kitchen NEXIUM 40 MG capsule Take 80 mg by mouth daily.     Marland Kitchen oxyCODONE-acetaminophen (PERCOCET/ROXICET) 5-325 MG tablet One tablet every six hours as needed for pain. 18 tablet 0  . TRELEGY ELLIPTA 100-62.5-25 MCG/INH AEPB Inhale 1 puff into the lungs daily.    Marland Kitchen ALPRAZolam (XANAX) 1 MG tablet Take 1 mg by mouth 4 (four) times daily.      No facility-administered medications prior to visit.     Objective:     BP (!) 140/100 (BP Location: Left Arm, Cuff Size: Normal)   Pulse (!) 58   Temp (!) 97.5 F (36.4 C) (Temporal)   Ht 5\' 10"  (1.778 m)   Wt 158 lb (71.7 kg)   SpO2 99%  Comment: on RA  BMI 22.67 kg/m   SpO2: 99 % (on RA)   Wt Readings from Last 3 Encounters:  10/01/19 158 lb (71.7 kg)  06/25/19 165 lb (74.8 kg)  06/03/19 163 lb 2 oz (74 kg)         amb slt hoarse thin wm nad    HEENT : pt wearing mask not removed for exam due to covid - 19 concerns.    NECK :  without JVD/Nodes/TM/ nl carotid upstrokes bilaterally   LUNGS: no acc muscle use,  Mild barrel  contour chest wall with bilateral  Distant bs s audible wheeze and  without cough on insp or exp maneuvers  and mild  Hyperresonant  to  percussion bilaterally     CV:  RRR  no s3 or murmur or increase in P2, and no edema   ABD:  soft and nontender with pos end  insp Hoover's  in the supine position. No bruits or organomegaly appreciated, bowel sounds nl  MS:   Nl gait/  ext warm without deformities, calf tenderness, cyanosis or clubbing No obvious joint restrictions   SKIN: warm and dry without lesions    NEURO:  alert, approp, nl sensorium with  no motor or cerebellar deficits apparent.        10/01/2019 cxr ordered  Labs ordered 10/01/2019  :  alpha one AT phenotype        Assessment   COPD  reported GOLD III/ still smoking  Active smoker - pFT's by [facility] around 2017 /18 reported "stage 3" - 10/01/2019  :    alpha one AT phenotype  - 10/01/2019  After extensive coaching inhaler device,  effectiveness =    90% with elipta >> continue trelegy     Group D in terms of symptom/risk and laba/lama/ICS  therefore appropriate rx at this point >>>  Continue trelegy and prn saba.   I spent extra time with pt today reviewing appropriate use of albuterol for prn use on exertion with the following points: 1) saba is for relief of sob that does not improve by walking a slower pace or resting but rather if the pt does not improve after trying this first. 2) If the pt is convinced, as many are, that saba helps recover from activity faster then it's easy to tell if this is the case by  re-challenging : ie stop, take the inhaler, then p 5 minutes try the exact same activity (intensity of workload) that just caused the symptoms and see if they are substantially diminished or not after saba 3) if there is an activity that reproducibly causes the symptoms, try the saba 15 min before the activity on  alternate days   If in fact the saba really does help, then fine to continue to use it prn but advised may need to look closer at the maintenance regimen being used to achieve better control of airways disease with exertion.        Cigarette smoker Counseled re importance of smoking cessation but did not meet time criteria for separate billing    Low-dose CT lung cancer screening is recommended for patients who are 66-62 years of age with a 20+ pack-year history of smoking, and who are currently smoking or quit <=15 years ago so pt is eligble and referred for shared decision making should he decide to pursue but advised smoking cessation has a smaller NNT to prevent dying from lung Ca.  Chronic GERD Advised :  PPi qam ac and pepcid 20 mg in pm x 3 m trial   Also advised on diet    F/u in 3 m with pfts.    Each maintenance medication was reviewed in detail including emphasizing most importantly the difference between maintenance and prns and under what circumstances the prns are to be triggered using an action plan format where appropriate.  Total time for H and P, chart review, counseling, teaching device and generating customized AVS unique to this office visit / charting = 60 min        Christinia Gully, MD 10/01/2019

## 2019-10-01 NOTE — Assessment & Plan Note (Signed)
Advised :  PPi qam ac and pepcid 20 mg in pm x 3 m trial   Also advised on diet

## 2019-10-01 NOTE — Patient Instructions (Addendum)
If you want to enter the lung cancer screening program give Eric Form NP  a call at [phone]  The key is to stop smoking completely before smoking completely stops you!   Nexium 40 mg   Take  30-60 min before first meal of the day and Pepcid (famotidine)  20 mg one after supper or before bedtime  until return to office - this is the best way to tell whether stomach acid is contributing to your problem.    GERD (REFLUX)  is an extremely common cause of respiratory symptoms just like yours , many times with no obvious heartburn at all.    It can be treated with medication, but also with lifestyle changes including elevation of the head of your bed (ideally with 6 -8inch blocks under the headboard of your bed),  Smoking cessation, avoidance of late meals, excessive alcohol, and avoid fatty foods, chocolate, peppermint, colas, red wine, and acidic juices such as orange juice.  NO MINT OR MENTHOL PRODUCTS SO NO COUGH DROPS  USE SUGARLESS CANDY INSTEAD (Jolley ranchers or Stover's or Life Savers) or even ice chips will also do - the key is to swallow to prevent all throat clearing. NO OIL BASED VITAMINS - use powdered substitutes.  Avoid fish oil when coughing.    No change in trelegy for now - be sure to brush teeth and gargle with arm and hammer toothpaste  Only use your albuterol as a rescue medication to be used if you can't catch your breath by resting or doing a relaxed purse lip breathing pattern.  - The less you use it, the better it will work when you need it. - Ok to use up to 2 puffs  every 4 hours if you must but call for immediate appointment if use goes up over your usual need - Don't leave home without it !!  (think of it like the spare tire for your car)   Please remember to go to the lab and x-ray department   for your tests - we will call you with the results when they are available.      Please schedule a follow up visit in 3 months but call sooner if needed with pft's  prior

## 2019-10-01 NOTE — Assessment & Plan Note (Signed)
Counseled re importance of smoking cessation but did not meet time criteria for separate billing    Low-dose CT lung cancer screening is recommended for patients who are 45-62 years of age with a 20+ pack-year history of smoking, and who are currently smoking or quit <=15 years ago so pt is eligble and referred for shared decision making should he decide to pursue but advised smoking cessation has a smaller NNT to prevent dying from lung Ca.   F/u in 3 m with pfts.          Each maintenance medication was reviewed in detail including emphasizing most importantly the difference between maintenance and prns and under what circumstances the prns are to be triggered using an action plan format where appropriate.  Total time for H and P, chart review, counseling, teaching device and generating customized AVS unique to this office visit / charting = 60 min

## 2019-10-01 NOTE — Assessment & Plan Note (Signed)
Active smoker - pFT's by [facility] around 2017 /18 reported "stage 3" - 10/01/2019  :    alpha one AT phenotype  - 10/01/2019  After extensive coaching inhaler device,  effectiveness =    90% with elipta >> continue trelegy     Group D in terms of symptom/risk and laba/lama/ICS  therefore appropriate rx at this point >>>  Continue trelegy and prn saba.   I spent extra time with pt today reviewing appropriate use of albuterol for prn use on exertion with the following points: 1) saba is for relief of sob that does not improve by walking a slower pace or resting but rather if the pt does not improve after trying this first. 2) If the pt is convinced, as many are, that saba helps recover from activity faster then it's easy to tell if this is the case by re-challenging : ie stop, take the inhaler, then p 5 minutes try the exact same activity (intensity of workload) that just caused the symptoms and see if they are substantially diminished or not after saba 3) if there is an activity that reproducibly causes the symptoms, try the saba 15 min before the activity on alternate days   If in fact the saba really does help, then fine to continue to use it prn but advised may need to look closer at the maintenance regimen being used to achieve better control of airways disease with exertion.

## 2019-10-29 ENCOUNTER — Other Ambulatory Visit: Payer: Self-pay

## 2019-10-29 ENCOUNTER — Encounter: Payer: Self-pay | Admitting: Orthopaedic Surgery

## 2019-10-29 ENCOUNTER — Ambulatory Visit (INDEPENDENT_AMBULATORY_CARE_PROVIDER_SITE_OTHER): Payer: Medicare Other | Admitting: Orthopaedic Surgery

## 2019-10-29 VITALS — Wt 162.0 lb

## 2019-10-29 DIAGNOSIS — G8929 Other chronic pain: Secondary | ICD-10-CM | POA: Diagnosis not present

## 2019-10-29 DIAGNOSIS — M25511 Pain in right shoulder: Secondary | ICD-10-CM | POA: Diagnosis not present

## 2019-10-29 MED ORDER — OXYCODONE-ACETAMINOPHEN 5-325 MG PO TABS: ORAL_TABLET | ORAL | 0 refills | Status: DC

## 2019-10-29 NOTE — Progress Notes (Signed)
PROCEDURE NOTE:  The patient request injection, verbal consent was obtained.  The right shoulder was prepped appropriately after time out was performed.   Sterile technique was observed and injection of 1 cc of Depo-Medrol 40 mg with several cc's of plain xylocaine. Anesthesia was provided by ethyl chloride and a 20-gauge needle was used to inject the shoulder area. A posterior approach was used.  The injection was tolerated well.  A band aid dressing was applied.  The patient was advised to apply ice later today and tomorrow to the injection sight as needed.  Return in two months.  I have reviewed the [place] web site prior to prescribing narcotic medicine for this patient.   Electronically Signed Sanjuana Kava, MD 7/15/20218:26 AM

## 2019-11-20 ENCOUNTER — Other Ambulatory Visit (HOSPITAL_COMMUNITY)
Admission: RE | Admit: 2019-11-20 | Discharge: 2019-11-20 | Disposition: A | Discharge status: Home / Self Care | Payer: Medicare Other | Source: Ambulatory Visit | Attending: Internal Medicine | Admitting: Internal Medicine

## 2019-11-20 ENCOUNTER — Other Ambulatory Visit: Payer: Self-pay

## 2019-11-20 DIAGNOSIS — Z20822 Contact with and (suspected) exposure to covid-19: Secondary | ICD-10-CM | POA: Insufficient documentation

## 2019-11-20 DIAGNOSIS — Z01812 Encounter for preprocedural laboratory examination: Secondary | ICD-10-CM | POA: Diagnosis present

## 2019-11-21 LAB — SARS CORONAVIRUS 2 (TAT 6-24 HRS): SARS Coronavirus 2: NEGATIVE

## 2019-11-24 ENCOUNTER — Ambulatory Visit (HOSPITAL_COMMUNITY)
Admission: RE | Admit: 2019-11-24 | Discharge: 2019-11-24 | Disposition: A | Discharge status: Home / Self Care | Payer: Medicare Other | Source: Ambulatory Visit | Attending: Internal Medicine | Admitting: Internal Medicine

## 2019-11-24 ENCOUNTER — Other Ambulatory Visit: Payer: Self-pay

## 2019-11-24 ENCOUNTER — Telehealth: Payer: Self-pay | Admitting: Internal Medicine

## 2019-11-24 DIAGNOSIS — J449 Chronic obstructive pulmonary disease, unspecified: Secondary | ICD-10-CM | POA: Insufficient documentation

## 2019-11-24 LAB — PULMONARY FUNCTION TEST
DL/VA % pred: 50 %
DL/VA: 2.11 ml/min/mmHg/L
DLCO unc % pred: 46 %
DLCO unc: 12.75 ml/min/mmHg
FEF 25-75 Post: 0.48 L/sec
FEF 25-75 Pre: 0.43 L/sec
FEF2575-%Change-Post: 10 %
FEF2575-%Pred-Post: 16 %
FEF2575-%Pred-Pre: 14 %
FEV1-%Change-Post: 1 %
FEV1-%Pred-Post: 42 %
FEV1-%Pred-Pre: 41 %
FEV1-Post: 1.51 L
FEV1-Pre: 1.48 L
FEV1FVC-%Change-Post: -1 %
FEV1FVC-%Pred-Pre: 47 %
FEV6-%Change-Post: 3 %
FEV6-%Pred-Post: 73 %
FEV6-%Pred-Pre: 71 %
FEV6-Post: 3.3 L
FEV6-Pre: 3.2 L
FEV6FVC-%Change-Post: 0 %
FEV6FVC-%Pred-Post: 81 %
FEV6FVC-%Pred-Pre: 81 %
FVC-%Change-Post: 3 %
FVC-%Pred-Post: 90 %
FVC-%Pred-Pre: 87 %
FVC-Post: 4.26 L
FVC-Pre: 4.13 L
Post FEV1/FVC ratio: 35 %
Post FEV6/FVC ratio: 77 %
Pre FEV1/FVC ratio: 36 %
Pre FEV6/FVC Ratio: 78 %
RV % pred: 192 %
RV: 4.39 L
TLC % pred: 125 %
TLC: 8.77 L

## 2019-11-24 MED ORDER — ALBUTEROL SULFATE (2.5 MG/3ML) 0.083% IN NEBU
2.5000 mg | INHALATION_SOLUTION | Freq: Once | RESPIRATORY_TRACT | Status: AC
  Administered 2019-11-24: 2.5 mg via RESPIRATORY_TRACT

## 2019-11-24 MED ORDER — TRELEGY ELLIPTA 100-62.5-25 MCG/INH IN AEPB: 1.0000 | INHALATION_SPRAY | Freq: Every day | RESPIRATORY_TRACT | 3 refills | Status: DC

## 2019-11-24 NOTE — Telephone Encounter (Signed)
Rx for trelegy has been sent to preferred pharmacy for pt. Attempted to call pt to let him know this had been done but unable to reach. Left pt a detailed message that the Rx had been refilled. Nothing further needed.

## 2019-12-22 ENCOUNTER — Other Ambulatory Visit: Payer: Self-pay

## 2019-12-22 ENCOUNTER — Ambulatory Visit (INDEPENDENT_AMBULATORY_CARE_PROVIDER_SITE_OTHER): Payer: Medicare Other | Admitting: Orthopaedic Surgery

## 2019-12-22 ENCOUNTER — Encounter: Payer: Self-pay | Admitting: Orthopaedic Surgery

## 2019-12-22 DIAGNOSIS — G8929 Other chronic pain: Secondary | ICD-10-CM | POA: Diagnosis not present

## 2019-12-22 DIAGNOSIS — F17210 Nicotine dependence, cigarettes, uncomplicated: Secondary | ICD-10-CM

## 2019-12-22 DIAGNOSIS — M25511 Pain in right shoulder: Secondary | ICD-10-CM | POA: Diagnosis not present

## 2019-12-22 MED ORDER — OXYCODONE-ACETAMINOPHEN 5-325 MG PO TABS: ORAL_TABLET | ORAL | 0 refills | Status: DC

## 2019-12-22 NOTE — Progress Notes (Signed)
PROCEDURE NOTE:  The patient request injection, verbal consent was obtained.  The right shoulder was prepped appropriately after time out was performed.   Sterile technique was observed and injection of 1 cc of Depo-Medrol 40 mg with several cc's of plain xylocaine. Anesthesia was provided by ethyl chloride and a 20-gauge needle was used to inject the shoulder area. A posterior approach was used.  The injection was tolerated well.  A band aid dressing was applied.  The patient was advised to apply ice later today and tomorrow to the injection sight as needed.  I have reviewed the [place] web site prior to prescribing narcotic medicine for this patient.   Return in one month.  MRI report: IMPRESSION: 1. Moderate rotator cuff tendinopathy/tendinosis. No full-thickness retracted rotator cuff tear. 2. Significant tendinopathy and longitudinal split type tear involving the long head biceps tendon. 3. Moderate AC joint degenerative changes and type 3 acromion may contribute to bony impingement. 4. Moderate glenohumeral joint degenerative changes with small joint effusion and mild synovitis versus adhesive capsulitis. 5. Mild subacromial/subdeltoid bursitis.   Electronically Signed Sanjuana Kava, MD 9/7/20218:30 AM

## 2020-01-04 ENCOUNTER — Ambulatory Visit (INDEPENDENT_AMBULATORY_CARE_PROVIDER_SITE_OTHER): Payer: Medicare Other | Admitting: Internal Medicine

## 2020-01-04 ENCOUNTER — Encounter: Payer: Self-pay | Admitting: Internal Medicine

## 2020-01-04 ENCOUNTER — Other Ambulatory Visit: Payer: Self-pay

## 2020-01-04 DIAGNOSIS — J449 Chronic obstructive pulmonary disease, unspecified: Secondary | ICD-10-CM | POA: Diagnosis not present

## 2020-01-04 DIAGNOSIS — F17210 Nicotine dependence, cigarettes, uncomplicated: Secondary | ICD-10-CM

## 2020-01-04 MED ORDER — ALBUTEROL SULFATE HFA 108 (90 BASE) MCG/ACT IN AERS: 2.0000 | INHALATION_SPRAY | Freq: Four times a day (QID) | RESPIRATORY_TRACT | 3 refills | Status: DC | PRN

## 2020-01-04 NOTE — Patient Instructions (Addendum)
Keep working on no smoking at all   No change  in medications  Only use your albuterol as a rescue medication to be used if you can't catch your breath by resting or doing a relaxed purse lip breathing pattern.  - The less you use it, the better it will work when you need it. - Ok to use up to 2 puffs  every 4 hours if you must but call for immediate appointment if use goes up over your usual need - Don't leave home without it !!  (think of it like the spare tire for your car)    Please schedule a follow up visit in 12  months but call sooner if needed

## 2020-01-04 NOTE — Progress Notes (Signed)
Adam Wheeler, male    DOB: 1955-05-01    MRN: [id number]   Brief patient profile:  13 yowm  Active smoker with new onset doe x 2017 eval by Adam Wheeler and Adam Wheeler dx copd and rx trelegy self referred to the pulmonary clinic in [place] 10/01/2019    History of Present Illness  10/01/2019  Pulmonary/ 1st office eval/Adam Wheeler  Chief Complaint  Patient presents with  . Pulmonary Consult    Former pt of Dr Adam Wheeler- COPD. He states today his breathing is "average"- needing samples of trelegy. He states this helps his SOB more than anything.   Dyspnea:  MMRC3 = can't walk 100 yards even at a slow pace at a flat grade s stopping due to sob  Difficulty with [employer] even going slowly and not checking sats/ worse since ran out of trelegy  Cough: none  Sleep: ok 30-45 degrees due to gerd not cough or sob  SABA use: once or twice a week rec Nexium 40 mg   Take  30-60 min before first meal of the day and Pepcid (famotidine)  20 mg one after supper or before bedtime  until return to office - this is the best way to tell whether stomach acid is contributing to your problem.   GERD  No change in trelegy for now - be sure to brush teeth and gargle with arm and hammer toothpaste Only use your albuterol as a rescue medication    01/04/2020  f/u ov/Adam Wheeler re: COPD III/ still smoking maint on trelegy/ vaccinated for covid 84 Chief Complaint  Patient presents with  . Follow-up    patient is feeling better since last visit, no concerns at this time.   Dyspnea:  Easier to get across parking lot and thru the store at [employer]  Cough: am only, x 15 min then resolves  Sleeping: ok on 3 in incline  SABA use: once a week maybe  02: none    No obvious day to day or daytime variability or assoc excess/ purulent sputum or mucus plugs or hemoptysis or cp or chest tightness, subjective wheeze or overt sinus or hb symptoms.   Sleeping  without nocturnal  or early am exacerbation  of respiratory  c/o's or need for noct  saba. Also denies any obvious fluctuation of symptoms with weather or environmental changes or other aggravating or alleviating factors except as outlined above   No unusual exposure hx or h/o childhood pna/ asthma or knowledge of premature birth.  Current Allergies, Complete Past Medical History, Past Surgical History, Family History, and Social History were reviewed in [employer] record.  ROS  The following are not active complaints unless bolded Hoarseness, sore throat, dysphagia, dental problems, itching, sneezing,  nasal congestion or discharge of excess mucus or purulent secretions, ear ache,   fever, chills, sweats, unintended wt loss or wt gain, classically pleuritic or exertional cp,  orthopnea pnd or arm/hand swelling  or leg swelling, presyncope, palpitations, abdominal pain, anorexia, nausea, vomiting, diarrhea  or change in bowel habits or change in bladder habits, change in stools or change in urine, dysuria, hematuria,  rash, arthralgias, visual complaints, headache, numbness, weakness or ataxia or problems with walking or coordination,  change in mood or  memory.        Current Meds  Medication Sig  . albuterol (VENTOLIN HFA) 108 (90 Base) MCG/ACT inhaler Inhale 2 puffs into the lungs every 6 (six) hours as needed for wheezing or shortness of breath.  Marland Kitchen  ARIPiprazole (ABILIFY) 2 MG tablet Take 2 mg by mouth daily.  . diazepam (VALIUM) 10 MG tablet   . diclofenac (VOLTAREN) 75 MG EC tablet Take 1 tablet (75 mg total) by mouth 2 (two) times daily with a meal.  . famotidine (PEPCID) 20 MG tablet One after supper  . FLUoxetine (PROZAC) 40 MG capsule Take 40 mg by mouth every morning.  Marland Kitchen NEXIUM 40 MG capsule Take 80 mg by mouth daily.   Marland Kitchen oxyCODONE-acetaminophen (PERCOCET/ROXICET) 5-325 MG tablet One tablet every six hours as needed for pain.  . TRELEGY ELLIPTA 100-62.5-25 MCG/INH AEPB Inhale 1 puff into the lungs daily.  . [DISCONTINUED] albuterol (PROVENTIL  HFA;VENTOLIN HFA) 108 (90 BASE) MCG/ACT inhaler Inhale 2 puffs into the lungs every 6 (six) hours as needed for wheezing or shortness of breath.                           Past Medical History:  Diagnosis Date  . Ankle fracture, right 06/2012  . Anxiety   . Arthritis   . Colon polyps    adenomatous polyps on multiple colonoscopies, starting in his early 96s  . COPD (chronic obstructive pulmonary disease) ([place])   . Depression   . History of DVT (deep vein thrombosis)   . Hypertension   . Kidney stones   . Seizures ([place])    has seizures weekly  . Sleep apnea   . Stroke [facility]) 2005   post knee surgery in 2005         Objective:      amb baritone voice good texture   Wt Readings from Last 3 Encounters:  10/01/19 158 lb (71.7 kg)  06/25/19 165 lb (74.8 kg)  06/03/19 163 lb 2 oz (74 kg)     Vital signs reviewed  01/04/2020  - Note at rest 02 sats  97% on RA      HEENT : pt wearing mask not removed for exam due to covid - 19 concerns.    NECK :  without JVD/Nodes/TM/ nl carotid upstrokes bilaterally   LUNGS: no acc muscle use,  Mild barrel  contour chest wall with bilateral  Distant bs s audible wheeze and  without cough on insp or exp maneuvers  and mild  Hyperresonant  to  percussion bilaterally     CV:  RRR  no s3 or murmur or increase in P2, and no edema   ABD:  soft and nontender with pos end  insp Hoover's  in the supine position. No bruits or organomegaly appreciated, bowel sounds nl  MS:   Nl gait/  ext warm without deformities, calf tenderness, cyanosis or clubbing No obvious joint restrictions   SKIN: warm and dry without lesions    NEURO:  alert, approp, nl sensorium with  no motor or cerebellar deficits apparent.             Labs ordered 01/04/2020  :     alpha one AT phenotype       Assessment

## 2020-01-05 ENCOUNTER — Encounter: Payer: Self-pay | Admitting: Internal Medicine

## 2020-01-05 NOTE — Assessment & Plan Note (Signed)
Active smoker - pFT's by [facility] around 2017 /18 reported "stage 3" - 10/01/2019  After extensive coaching inhaler device,  effectiveness =    90% with elipta >> continue trelegy   - PFT's  11/24/19   FEV1 1.51 (42 % ) ratio 0.35  p 1 % improvement from saba p "inhaler" 6 h prior to study with DLCO  12.75 (46%) corrects to 2.11 (50%)  for alv volume and FV curve classic concave exp flow vol  - alpha one AT screen   01/04/2020 >>>    Group D in terms of symptom/risk and laba/lama/ICS  therefore appropriate rx at this point >>>  Continue trelegy and prn saba

## 2020-01-05 NOTE — Assessment & Plan Note (Signed)
Counseled re importance of smoking cessation but did not meet time criteria for separate billing           Each maintenance medication was reviewed in detail including emphasizing most importantly the difference between maintenance and prns and under what circumstances the prns are to be triggered using an action plan format where appropriate.  Total time for H and P, chart review, counseling, teaching device and generating customized AVS unique to this office visit / charting = 15 min        

## 2020-01-18 ENCOUNTER — Telehealth: Payer: Self-pay | Admitting: Orthopaedic Surgery

## 2020-01-18 ENCOUNTER — Other Ambulatory Visit: Payer: Medicare Other

## 2020-01-18 ENCOUNTER — Other Ambulatory Visit: Payer: Self-pay

## 2020-01-18 MED ORDER — OXYCODONE-ACETAMINOPHEN 5-325 MG PO TABS: ORAL_TABLET | ORAL | 0 refills | Status: DC

## 2020-01-18 NOTE — Addendum Note (Signed)
Addended by: Willette Pa on: 01/18/2020 05:45 PM   Modules accepted: Orders

## 2020-01-18 NOTE — Telephone Encounter (Signed)
Patient is running a fever and is going to get [place] today, and needs refill on pain meds. He canceled his appt for tomorrow.   [place]

## 2020-01-19 ENCOUNTER — Ambulatory Visit: Payer: Medicare Other | Admitting: Orthopaedic Surgery

## 2020-01-21 ENCOUNTER — Other Ambulatory Visit: Payer: Self-pay

## 2020-02-16 ENCOUNTER — Ambulatory Visit (INDEPENDENT_AMBULATORY_CARE_PROVIDER_SITE_OTHER): Payer: Medicare Other | Admitting: Orthopaedic Surgery

## 2020-02-16 ENCOUNTER — Encounter: Payer: Self-pay | Admitting: Orthopaedic Surgery

## 2020-02-16 ENCOUNTER — Other Ambulatory Visit: Payer: Self-pay

## 2020-02-16 DIAGNOSIS — M25511 Pain in right shoulder: Secondary | ICD-10-CM

## 2020-02-16 DIAGNOSIS — G8929 Other chronic pain: Secondary | ICD-10-CM

## 2020-02-16 MED ORDER — OXYCODONE-ACETAMINOPHEN 5-325 MG PO TABS: ORAL_TABLET | ORAL | 0 refills | Status: DC

## 2020-02-16 NOTE — Patient Instructions (Signed)
Steps to Quit Smoking Smoking tobacco is the leading cause of preventable death. It can affect almost every organ in the body. Smoking puts you and people around you at risk for many serious, long-lasting (chronic) diseases. Quitting smoking can be hard, but it is one of the best things that you can do for your health. It is never too late to quit. How do I get ready to quit? When you decide to quit smoking, make a plan to help you succeed. Before you quit:  Pick a date to quit. Set a date within the next 2 weeks to give you time to prepare.  Write down the reasons why you are quitting. Keep this list in places where you will see it often.  Tell your family, friends, and co-workers that you are quitting. Their support is important.  Talk with your doctor about the choices that may help you quit.  Find out if your health insurance will pay for these treatments.  Know the people, places, things, and activities that make you want to smoke (triggers). Avoid them. What first steps can I take to quit smoking?  Throw away all cigarettes at home, at work, and in your car.  Throw away the things that you use when you smoke, such as ashtrays and lighters.  Clean your car. Make sure to empty the ashtray.  Clean your home, including curtains and carpets. What can I do to help me quit smoking? Talk with your doctor about taking medicines and seeing a counselor at the same time. You are more likely to succeed when you do both.  If you are pregnant or breastfeeding, talk with your doctor about counseling or other ways to quit smoking. Do not take medicine to help you quit smoking unless your doctor tells you to do so. To quit smoking: Quit right away  Quit smoking totally, instead of slowly cutting back on how much you smoke over a period of time.  Go to counseling. You are more likely to quit if you go to counseling sessions regularly. Take medicine You may take medicines to help you quit. Some  medicines need a prescription, and some you can buy over-the-counter. Some medicines may contain a drug called nicotine to replace the nicotine in cigarettes. Medicines may:  Help you to stop having the desire to smoke (cravings).  Help to stop the problems that come when you stop smoking (withdrawal symptoms). Your doctor may ask you to use:  Nicotine patches, gum, or lozenges.  Nicotine inhalers or sprays.  Non-nicotine medicine that is taken by mouth. Find resources Find resources and other ways to help you quit smoking and remain smoke-free after you quit. These resources are most helpful when you use them often. They include:  Online chats with a counselor.  Phone quitlines.  Printed self-help materials.  Support groups or group counseling.  Text messaging programs.  Mobile phone apps. Use apps on your mobile phone or tablet that can help you stick to your quit plan. There are many free apps for mobile phones and tablets as well as websites. Examples include Quit Guide from the CDC and smokefree.gov  What things can I do to make it easier to quit?   Talk to your family and friends. Ask them to support and encourage you.  Call a phone quitline (1-800-QUIT-NOW), reach out to support groups, or work with a counselor.  Ask people who smoke to not smoke around you.  Avoid places that make you want to smoke,   such as: ? Bars. ? Parties. ? Smoke-break areas at work.  Spend time with people who do not smoke.  Lower the stress in your life. Stress can make you want to smoke. Try these things to help your stress: ? Getting regular exercise. ? Doing deep-breathing exercises. ? Doing yoga. ? Meditating. ? Doing a body scan. To do this, close your eyes, focus on one area of your body at a time from head to toe. Notice which parts of your body are tense. Try to relax the muscles in those areas. How will I feel when I quit smoking? Day 1 to 3 weeks Within the first 24 hours,  you may start to have some problems that come from quitting tobacco. These problems are very bad 2-3 days after you quit, but they do not often last for more than 2-3 weeks. You may get these symptoms:  Mood swings.  Feeling restless, nervous, angry, or annoyed.  Trouble concentrating.  Dizziness.  Strong desire for high-sugar foods and nicotine.  Weight gain.  Trouble pooping (constipation).  Feeling like you may vomit (nausea).  Coughing or a sore throat.  Changes in how the medicines that you take for other issues work in your body.  Depression.  Trouble sleeping (insomnia). Week 3 and afterward After the first 2-3 weeks of quitting, you may start to notice more positive results, such as:  Better sense of smell and taste.  Less coughing and sore throat.  Slower heart rate.  Lower blood pressure.  Clearer skin.  Better breathing.  Fewer sick days. Quitting smoking can be hard. Do not give up if you fail the first time. Some people need to try a few times before they succeed. Do your best to stick to your quit plan, and talk with your doctor if you have any questions or concerns. Summary  Smoking tobacco is the leading cause of preventable death. Quitting smoking can be hard, but it is one of the best things that you can do for your health.  When you decide to quit smoking, make a plan to help you succeed.  Quit smoking right away, not slowly over a period of time.  When you start quitting, seek help from your doctor, family, or friends. This information is not intended to replace advice given to you by your health care provider. Make sure you discuss any questions you have with your health care provider. Document Revised: 12/26/2018 Document Reviewed: 06/21/2018 Elsevier Patient Education  2020 Elsevier Inc.  

## 2020-02-16 NOTE — Progress Notes (Signed)
PROCEDURE NOTE:  The patient request injection, verbal consent was obtained.  The right shoulder was prepped appropriately after time out was performed.   Sterile technique was observed and injection of 1 cc of Depo-Medrol 40 mg with several cc's of plain xylocaine. Anesthesia was provided by ethyl chloride and a 20-gauge needle was used to inject the shoulder area. A posterior approach was used.  The injection was tolerated well.  A band aid dressing was applied.  The patient was advised to apply ice later today and tomorrow to the injection sight as needed.  I have reviewed the [place] web site prior to prescribing narcotic medicine for this patient.   Return in two months.  Electronically Signed Sanjuana Kava, MD 11/2/20219:41 AM

## 2020-02-19 ENCOUNTER — Other Ambulatory Visit: Payer: Self-pay | Admitting: Internal Medicine

## 2020-03-21 ENCOUNTER — Telehealth: Payer: Self-pay | Admitting: Orthopaedic Surgery

## 2020-03-21 NOTE — Telephone Encounter (Signed)
Patient requests refill on Voltaren 75 mgs.  Qty  60  Refills 5   Sig: Take 1 tablet (75 mg total) by mouth 2 (two) times daily with a meal.  Patient states he uses [facility]

## 2020-03-21 NOTE — Telephone Encounter (Signed)
Patient requests refill on Oxdycodone/Acetaminophen 5-325  Mgs.  Qty 25  Sig: One tablet every six hours as needed for pain.  Patient states he uses [facility]

## 2020-03-22 MED ORDER — DICLOFENAC SODIUM 75 MG PO TBEC: 75.0000 mg | DELAYED_RELEASE_TABLET | Freq: Two times a day (BID) | ORAL | 5 refills | Status: DC

## 2020-03-22 MED ORDER — OXYCODONE-ACETAMINOPHEN 5-325 MG PO TABS: ORAL_TABLET | ORAL | 0 refills | Status: DC

## 2020-03-29 ENCOUNTER — Ambulatory Visit (INDEPENDENT_AMBULATORY_CARE_PROVIDER_SITE_OTHER): Payer: Medicare Other | Admitting: Gastroenterology

## 2020-03-29 ENCOUNTER — Other Ambulatory Visit: Payer: Self-pay

## 2020-03-29 ENCOUNTER — Encounter: Payer: Self-pay | Admitting: Gastroenterology

## 2020-03-29 ENCOUNTER — Telehealth: Payer: Self-pay | Admitting: *Deleted

## 2020-03-29 VITALS — BP 152/98 | HR 55 | Temp 97.5°F | Ht 70.0 in | Wt 156.4 lb

## 2020-03-29 DIAGNOSIS — K219 Gastro-esophageal reflux disease without esophagitis: Secondary | ICD-10-CM | POA: Diagnosis not present

## 2020-03-29 DIAGNOSIS — R1310 Dysphagia, unspecified: Secondary | ICD-10-CM | POA: Diagnosis not present

## 2020-03-29 DIAGNOSIS — R197 Diarrhea, unspecified: Secondary | ICD-10-CM | POA: Diagnosis not present

## 2020-03-29 DIAGNOSIS — Z86010 Personal history of colonic polyps: Secondary | ICD-10-CM

## 2020-03-29 DIAGNOSIS — K921 Melena: Secondary | ICD-10-CM | POA: Diagnosis not present

## 2020-03-29 MED ORDER — CLENPIQ 10-3.5-12 MG-GM -GM/160ML PO SOLN: 1.0000 | Freq: Once | ORAL | 0 refills | Status: AC

## 2020-03-29 MED ORDER — DICYCLOMINE HCL 10 MG PO CAPS: 10.0000 mg | ORAL_CAPSULE | Freq: Three times a day (TID) | ORAL | 3 refills | Status: DC

## 2020-03-29 NOTE — Progress Notes (Signed)
CC'ED TO PCP 

## 2020-03-29 NOTE — H&P (View-Only) (Signed)
Primary Care Physician:  Marval Regal, MD Primary Gastroenterologist:  Dr. Abbey Chatters   Chief Complaint  Patient presents with  . black stool  . Diarrhea    Has took Pepto Bismol but stool was black prior    HPI:   Adam Wheeler is a 64 y.o. male presenting today for surveillance colonoscopy. Personal history of multiple colonoscopies since his 27s, with adenomatous polyps on multiple occasions. Last colonoscopy in 2014 without polyps. Notable history of chronic diarrhea. He noted black stool in 2014 but he was heme negative. Prior negative duodenal biopsies for celiac disease. Negative colonic biopsies for microscopic colitis.   Has at least 1 watery stool each morning, present for over a year. Sometimes frequent throughout the day. After colonoscopy, he had normal formed BMs until about a year ago. No hematochezia. Stool is black. Started happening again about 6 months ago. Saw black stool even prior to Pepto Bismol. Started Pepto about a month ago. Sometimes brown stool. No abdominal pain. Notes 10-12 lbs weight loss over past 3 months. Unintentional. No appetite. Feels full off small amounts of food. Feels like food is getting hung again. Nexium 40 mg daily. Pepcid prn. Voltaren for almost a year.   .   Past Medical History:  Diagnosis Date  . Ankle fracture, right 06/2012  . Anxiety   . Arthritis   . Colon polyps    adenomatous polyps on multiple colonoscopies, starting in his early 55s  . COPD (chronic obstructive pulmonary disease) ([place])   . Depression   . History of DVT (deep vein thrombosis)   . Hypertension   . Kidney stones   . Seizures ([facility])    has seizures weekly  . Sleep apnea   . Stroke [facility]) 2005   post knee surgery in 2005    Past Surgical History:  Procedure Laterality Date  . BIOPSY N/A 07/22/2012   Procedure: BIOPSY;  Surgeon: Danie Binder, MD;  Location: [facility];  Service: Endoscopy;  Laterality: N/A;  . COLONOSCOPY  06/08/2004   Dr.  Rehman:Small polyp cold snared from transverse colon/ Small external hemorrhoids  . COLONOSCOPY  06/16/09   QPR:FFMBW internal hemorrhoids/simple adenomas (four polyps removed). Random colon biopsies negative for microscopic colitis. Screening colonoscopy in 5 years with propofol recommended by Dr. Oneida Alar.  . COLONOSCOPY WITH PROPOFOL N/A 07/22/2012   Negative colonic and small bowel biopsies. Moderate sized internal hemorrhoids.   . ESOPHAGOGASTRODUODENOSCOPY  06/16/09   SLF: distal peptic stricture, mild erythema, esophagus dilated to 16 mm. Patchy erythema in the antrum with occasional erosion with active oozing. Patchy erythema in the duodenal bulb. Small bowel biopsies benign. Minimal chronic gastritis with no H. pylori.  . ESOPHAGOGASTRODUODENOSCOPY (EGD) WITH PROPOFOL N/A 07/22/2012   gastritis. EGD with stricture at GE junction, moderate non-erosive gastritis, mild duodenitis, negative H.pylori   . left knee surgery  2005  . SAVORY DILATION N/A 07/22/2012   Procedure: SAVORY DILATION;  Surgeon: Danie Binder, MD;  Location: [facility];  Service: Endoscopy;  Laterality: N/A;  15, 16, 17    Current Outpatient Medications  Medication Sig Dispense Refill  . albuterol (VENTOLIN HFA) 108 (90 Base) MCG/ACT inhaler Inhale 2 puffs into the lungs every 6 (six) hours as needed for wheezing or shortness of breath. 1 each 3  . ARIPiprazole (ABILIFY) 2 MG tablet Take 2 mg by mouth daily.    . Bismuth Subsalicylate (PEPTO-BISMOL PO) Take by mouth as needed.    Marland Kitchen  diazepam (VALIUM) 10 MG tablet Take 10 mg by mouth daily.    . diclofenac (VOLTAREN) 75 MG EC tablet Take 1 tablet (75 mg total) by mouth 2 (two) times daily with a meal. 60 tablet 5  . famotidine (PEPCID) 20 MG tablet One after supper 30 tablet 11  . FLUoxetine (PROZAC) 40 MG capsule Take 40 mg by mouth every morning.    Marland Kitchen lisinopril (ZESTRIL) 10 MG tablet Take 1 tablet by mouth daily.    Marland Kitchen NEXIUM 40 MG capsule Take 80 mg by mouth daily.    Marland Kitchen  oxyCODONE-acetaminophen (PERCOCET/ROXICET) 5-325 MG tablet One tablet every six hours as needed for pain. 25 tablet 0  . TRELEGY ELLIPTA 100-62.5-25 MCG/INH AEPB INHALE 1 PUFFS BY MOUTH ONCE DAILY 60 each 3   No current facility-administered medications for this visit.    Allergies as of 03/29/2020 - Review Complete 03/29/2020  Allergen Reaction Noted  . Lortab [hydrocodone-acetaminophen] Nausea And Vomiting 01/04/2014    Family History  Problem Relation Age of Onset  . Colon cancer Mother        diagnosed in her 15s, deceased at 4  . Breast cancer Mother   . Cancer Father        deceased age 15, metastatic cancer ?stomach  . Colon cancer Other        2 maternal uncles and one first cousin    Social History   Socioeconomic History  . Marital status: Married    Spouse name: Not on file  . Number of children: 4  . Years of education: Not on file  . Highest education level: Not on file  Occupational History  . Occupation: Disabled  Tobacco Use  . Smoking status: Current Every Day Smoker    Packs/day: 0.25    Years: 40.00    Pack years: 10.00    Types: Cigarettes  . Smokeless tobacco: Never Used  . Tobacco comment: 1-2 cigarettes a day, some days none  Vaping Use  . Vaping Use: Never used  Substance and Sexual Activity  . Alcohol use: No  . Drug use: No  . Sexual activity: Not on file  Other Topics Concern  . Not on file  Social History Narrative  . Not on file   Social Determinants of Health   Financial Resource Strain: Not on file  Food Insecurity: Not on file  Transportation Wheeler: Not on file  Physical Activity: Not on file  Stress: Not on file  Social Connections: Not on file  Intimate Partner Violence: Not on file    Review of Systems: Gen: see HPI CV: Denies chest pain, heart palpitations, peripheral edema, syncope.  Resp: Denies shortness of breath at rest or with exertion. Denies wheezing or cough.  GI: see HPI GU : Denies urinary burning,  urinary frequency, urinary hesitancy MS: Denies joint pain, muscle weakness, cramps, or limitation of movement.  Derm: Denies rash, itching, dry skin Psych: Denies depression, anxiety, memory loss, and confusion Heme: see HPI  Physical Exam: BP (!) 152/98   Pulse (!) 55   Temp (!) 97.5 F (36.4 C) (Temporal)   Ht 5\' 10"  (1.778 m)   Wt 156 lb 6.4 oz (70.9 kg)   BMI 22.44 kg/m  General:   Alert and oriented. Pleasant and cooperative. Well-nourished and well-developed.  Head:  Normocephalic and atraumatic. Eyes:  Without icterus, sclera clear and conjunctiva pink.  Ears:  Normal auditory acuity. Mouth:  Mask in place Lungs:  Mild expiratory wheeze bilaterally, unlabored  Heart:  S1, S2 present without murmurs appreciated.  Abdomen:  +BS, soft, non-tender and non-distended. No HSM noted. No guarding or rebound. No masses appreciated.  Rectal:  Deferred  Msk:  Symmetrical without gross deformities. Normal posture. Extremities:  Without edema. Neurologic:  Alert and  oriented x4;  grossly normal neurologically. Skin:  Intact without significant lesions or rashes. Psych:  Alert and cooperative. Normal mood and affect.  ASSESSMENT: KYZER BLOWE is a 64 y.o. male presenting today with history of multiple adenomas in remote past and last colonoscopy in 2014 without polyps, history of chronic, intermittent diarrhea with negative biopsies for microscopic colitis and negative duodenal biopsies for celiac disease, GERD, and dysphagia with last dilation in 2014 of stricture.  Notes recurrent diarrhea for the past year. Doubt infectious due to chronicity of this. Interestingly, he has had intermittent chronic diarrhea dating back many years but thus far negative work-up. Will trial low-dose Bentyl to use only as needed. Colonoscopy as planned in near future due to history of polyps. May benefit from round of Xifaxan and even could consider pancreatic enzymes.   Dark stool: he reports black  stool intermittently for the past 6 months. Although taking pepto now, he noted black stool prior to this. Also reported this in 2014. I am requesting outside labs. Colonoscopy as planned, along with EGD/dilation for dysphagia. I do note he is on Voltaren. Unable to exclude small bowel source. Would recommend capsule study to lay this issue to rest as he has noted this previously without obvious source.  Dysphagia: recurrent. Also with early satiety and unintentional weight loss 10-12 lbs over past few months. EGD/dilation planned.   PLAN: Proceed with colonoscopy/EGD/dilation by Dr. Abbey Chatters  in near future: the risks, benefits, and alternatives have been discussed with the patient in detail. The patient states understanding and desires to proceed.   Continue Nexium daily  Trial of Bentyl prn: discussed prn usage  Consider capsule study if no obvious evidence for questionable melena  Obtain outside labs  Imaging if further weight loss  3-4 month follow-up   Adam Needs, PhD, ANP-BC [facility]

## 2020-03-29 NOTE — Telephone Encounter (Signed)
Called pt. He has been scheduled for TCS/EGD/DIL with Dr. Abbey Chatters, ASA 2 on 1/6 at 11:00am. Request small volume prep be sent to [place]  Attempted to check for PA and received message "patient not eligible"

## 2020-03-29 NOTE — Patient Instructions (Signed)
We are arranging a colonoscopy, upper endoscopy, and dilation in the near future.  Continue Nexium once daily.  I have sent in a medication called dicyclomine (Bentyl). Take this 30 minutes before breakfast and up to 3-4 times per day before meals ONLY AS NEEDED for looser stool. This can constipate you, so if it's a "good day", just take once in the morning. If a bad day with more frequent stool, you can increase up to 4 times a day.  We will see you in 3-4 months!  It was a pleasure to see you today. I want to create trusting relationships with patients to provide genuine, compassionate, and quality care. I value your feedback. If you receive a survey regarding your visit,  I greatly appreciate you taking time to fill this out.   Annitta Needs, PhD, ANP-BC [facility]

## 2020-03-29 NOTE — Progress Notes (Signed)
Primary Care Physician:  Marval Regal, MD Primary Gastroenterologist:  Dr. Abbey Chatters   Chief Complaint  Patient presents with  . black stool  . Diarrhea    Has took Pepto Bismol but stool was black prior    HPI:   Adam Wheeler is a 64 y.o. male presenting today for surveillance colonoscopy. Personal history of multiple colonoscopies since his 84s, with adenomatous polyps on multiple occasions. Last colonoscopy in 2014 without polyps. Notable history of chronic diarrhea. He noted black stool in 2014 but he was heme negative. Prior negative duodenal biopsies for celiac disease. Negative colonic biopsies for microscopic colitis.   Has at least 1 watery stool each morning, present for over a year. Sometimes frequent throughout the day. After colonoscopy, he had normal formed BMs until about a year ago. No hematochezia. Stool is black. Started happening again about 6 months ago. Saw black stool even prior to Pepto Bismol. Started Pepto about a month ago. Sometimes brown stool. No abdominal pain. Notes 10-12 lbs weight loss over past 3 months. Unintentional. No appetite. Feels full off small amounts of food. Feels like food is getting hung again. Nexium 40 mg daily. Pepcid prn. Voltaren for almost a year.   .   Past Medical History:  Diagnosis Date  . Ankle fracture, right 06/2012  . Anxiety   . Arthritis   . Colon polyps    adenomatous polyps on multiple colonoscopies, starting in his early 24s  . COPD (chronic obstructive pulmonary disease) ([place])   . Depression   . History of DVT (deep vein thrombosis)   . Hypertension   . Kidney stones   . Seizures ([place])    has seizures weekly  . Sleep apnea   . Stroke [facility]) 2005   post knee surgery in 2005    Past Surgical History:  Procedure Laterality Date  . BIOPSY N/A 07/22/2012   Procedure: BIOPSY;  Surgeon: Danie Binder, MD;  Location: [facility];  Service: Endoscopy;  Laterality: N/A;  . COLONOSCOPY  06/08/2004   Dr.  Rehman:Small polyp cold snared from transverse colon/ Small external hemorrhoids  . COLONOSCOPY  06/16/09   [id number] internal hemorrhoids/simple adenomas (four polyps removed). Random colon biopsies negative for microscopic colitis. Screening colonoscopy in 5 years with propofol recommended by Dr. Oneida Alar.  . COLONOSCOPY WITH PROPOFOL N/A 07/22/2012   Negative colonic and small bowel biopsies. Moderate sized internal hemorrhoids.   . ESOPHAGOGASTRODUODENOSCOPY  06/16/09   SLF: distal peptic stricture, mild erythema, esophagus dilated to 16 mm. Patchy erythema in the antrum with occasional erosion with active oozing. Patchy erythema in the duodenal bulb. Small bowel biopsies benign. Minimal chronic gastritis with no H. pylori.  . ESOPHAGOGASTRODUODENOSCOPY (EGD) WITH PROPOFOL N/A 07/22/2012   gastritis. EGD with stricture at GE junction, moderate non-erosive gastritis, mild duodenitis, negative H.pylori   . left knee surgery  2005  . SAVORY DILATION N/A 07/22/2012   Procedure: SAVORY DILATION;  Surgeon: Danie Binder, MD;  Location: [facility];  Service: Endoscopy;  Laterality: N/A;  15, 16, 17    Current Outpatient Medications  Medication Sig Dispense Refill  . albuterol (VENTOLIN HFA) 108 (90 Base) MCG/ACT inhaler Inhale 2 puffs into the lungs every 6 (six) hours as needed for wheezing or shortness of breath. 1 each 3  . ARIPiprazole (ABILIFY) 2 MG tablet Take 2 mg by mouth daily.    . Bismuth Subsalicylate (PEPTO-BISMOL PO) Take by mouth as needed.    Marland Kitchen  diazepam (VALIUM) 10 MG tablet Take 10 mg by mouth daily.    . diclofenac (VOLTAREN) 75 MG EC tablet Take 1 tablet (75 mg total) by mouth 2 (two) times daily with a meal. 60 tablet 5  . famotidine (PEPCID) 20 MG tablet One after supper 30 tablet 11  . FLUoxetine (PROZAC) 40 MG capsule Take 40 mg by mouth every morning.    Marland Kitchen lisinopril (ZESTRIL) 10 MG tablet Take 1 tablet by mouth daily.    Marland Kitchen NEXIUM 40 MG capsule Take 80 mg by mouth daily.    Marland Kitchen  oxyCODONE-acetaminophen (PERCOCET/ROXICET) 5-325 MG tablet One tablet every six hours as needed for pain. 25 tablet 0  . TRELEGY ELLIPTA 100-62.5-25 MCG/INH AEPB INHALE 1 PUFFS BY MOUTH ONCE DAILY 60 each 3   No current facility-administered medications for this visit.    Allergies as of 03/29/2020 - Review Complete 03/29/2020  Allergen Reaction Noted  . Lortab [hydrocodone-acetaminophen] Nausea And Vomiting 01/04/2014    Family History  Problem Relation Age of Onset  . Colon cancer Mother        diagnosed in her 51s, deceased at 80  . Breast cancer Mother   . Cancer Father        deceased age 30, metastatic cancer ?stomach  . Colon cancer Other        2 maternal uncles and one first cousin    Social History   Socioeconomic History  . Marital status: Married    Spouse name: Not on file  . Number of children: 4  . Years of education: Not on file  . Highest education level: Not on file  Occupational History  . Occupation: Disabled  Tobacco Use  . Smoking status: Current Every Day Smoker    Packs/day: 0.25    Years: 40.00    Pack years: 10.00    Types: Cigarettes  . Smokeless tobacco: Never Used  . Tobacco comment: 1-2 cigarettes a day, some days none  Vaping Use  . Vaping Use: Never used  Substance and Sexual Activity  . Alcohol use: No  . Drug use: No  . Sexual activity: Not on file  Other Topics Concern  . Not on file  Social History Narrative  . Not on file   Social Determinants of Health   Financial Resource Strain: Not on file  Food Insecurity: Not on file  Transportation Needs: Not on file  Physical Activity: Not on file  Stress: Not on file  Social Connections: Not on file  Intimate Partner Violence: Not on file    Review of Systems: Gen: see HPI CV: Denies chest pain, heart palpitations, peripheral edema, syncope.  Resp: Denies shortness of breath at rest or with exertion. Denies wheezing or cough.  GI: see HPI GU : Denies urinary burning,  urinary frequency, urinary hesitancy MS: Denies joint pain, muscle weakness, cramps, or limitation of movement.  Derm: Denies rash, itching, dry skin Psych: Denies depression, anxiety, memory loss, and confusion Heme: see HPI  Physical Exam: BP (!) 152/98   Pulse (!) 55   Temp (!) 97.5 F (36.4 C) (Temporal)   Ht 5\' 10"  (1.778 m)   Wt 156 lb 6.4 oz (70.9 kg)   BMI 22.44 kg/m  General:   Alert and oriented. Pleasant and cooperative. Well-nourished and well-developed.  Head:  Normocephalic and atraumatic. Eyes:  Without icterus, sclera clear and conjunctiva pink.  Ears:  Normal auditory acuity. Mouth:  Mask in place Lungs:  Mild expiratory wheeze bilaterally, unlabored  Heart:  S1, S2 present without murmurs appreciated.  Abdomen:  +BS, soft, non-tender and non-distended. No HSM noted. No guarding or rebound. No masses appreciated.  Rectal:  Deferred  Msk:  Symmetrical without gross deformities. Normal posture. Extremities:  Without edema. Neurologic:  Alert and  oriented x4;  grossly normal neurologically. Skin:  Intact without significant lesions or rashes. Psych:  Alert and cooperative. Normal mood and affect.  ASSESSMENT: Adam Wheeler is a 64 y.o. male presenting today with history of multiple adenomas in remote past and last colonoscopy in 2014 without polyps, history of chronic, intermittent diarrhea with negative biopsies for microscopic colitis and negative duodenal biopsies for celiac disease, GERD, and dysphagia with last dilation in 2014 of stricture.  Notes recurrent diarrhea for the past year. Doubt infectious due to chronicity of this. Interestingly, he has had intermittent chronic diarrhea dating back many years but thus far negative work-up. Will trial low-dose Bentyl to use only as needed. Colonoscopy as planned in near future due to history of polyps. May benefit from round of Xifaxan and even could consider pancreatic enzymes.   Dark stool: he reports black  stool intermittently for the past 6 months. Although taking pepto now, he noted black stool prior to this. Also reported this in 2014. I am requesting outside labs. Colonoscopy as planned, along with EGD/dilation for dysphagia. I do note he is on Voltaren. Unable to exclude small bowel source. Would recommend capsule study to lay this issue to rest as he has noted this previously without obvious source.  Dysphagia: recurrent. Also with early satiety and unintentional weight loss 10-12 lbs over past few months. EGD/dilation planned.   PLAN: Proceed with colonoscopy/EGD/dilation by Dr. Abbey Chatters  in near future: the risks, benefits, and alternatives have been discussed with the patient in detail. The patient states understanding and desires to proceed.   Continue Nexium daily  Trial of Bentyl prn: discussed prn usage  Consider capsule study if no obvious evidence for questionable melena  Obtain outside labs  Imaging if further weight loss  3-4 month follow-up   Annitta Needs, PhD, ANP-BC [facility]

## 2020-04-19 ENCOUNTER — Ambulatory Visit: Payer: Medicare Other | Admitting: Orthopaedic Surgery

## 2020-04-19 ENCOUNTER — Telehealth: Payer: Self-pay | Admitting: *Deleted

## 2020-04-19 NOTE — Telephone Encounter (Signed)
Submitted PA via [facility] website. PA pending. Ref# [id number]

## 2020-04-20 ENCOUNTER — Encounter (HOSPITAL_COMMUNITY): Payer: Self-pay

## 2020-04-20 ENCOUNTER — Other Ambulatory Visit (HOSPITAL_COMMUNITY)
Admission: RE | Admit: 2020-04-20 | Discharge: 2020-04-20 | Disposition: A | Discharge status: Home / Self Care | Payer: Medicare Other | Source: Ambulatory Visit | Attending: Internal Medicine | Admitting: Internal Medicine

## 2020-04-20 ENCOUNTER — Other Ambulatory Visit: Payer: Self-pay

## 2020-04-20 DIAGNOSIS — Z20822 Contact with and (suspected) exposure to covid-19: Secondary | ICD-10-CM | POA: Insufficient documentation

## 2020-04-20 DIAGNOSIS — Z01818 Encounter for other preprocedural examination: Secondary | ICD-10-CM | POA: Diagnosis present

## 2020-04-20 LAB — SARS CORONAVIRUS 2 (TAT 6-24 HRS): SARS Coronavirus 2: NEGATIVE

## 2020-04-20 NOTE — Telephone Encounter (Signed)
PA approved. DOS 04/21/2020-07/20/2020

## 2020-04-21 ENCOUNTER — Encounter (HOSPITAL_COMMUNITY)
Admission: RE | Disposition: A | Discharge status: Home / Self Care | Payer: Self-pay | Source: Home / Self Care | Attending: Internal Medicine

## 2020-04-21 ENCOUNTER — Ambulatory Visit (HOSPITAL_COMMUNITY): Payer: Medicare Other | Admitting: Anesthesiology

## 2020-04-21 ENCOUNTER — Ambulatory Visit (HOSPITAL_COMMUNITY)
Admission: RE | Admit: 2020-04-21 | Discharge: 2020-04-21 | Disposition: A | Discharge status: Home / Self Care | Payer: Medicare Other | Attending: Internal Medicine | Admitting: Internal Medicine

## 2020-04-21 ENCOUNTER — Other Ambulatory Visit: Payer: Self-pay

## 2020-04-21 ENCOUNTER — Ambulatory Visit: Payer: Medicare Other | Admitting: Orthopaedic Surgery

## 2020-04-21 ENCOUNTER — Encounter (HOSPITAL_COMMUNITY): Payer: Self-pay

## 2020-04-21 DIAGNOSIS — Z888 Allergy status to other drugs, medicaments and biological substances status: Secondary | ICD-10-CM | POA: Diagnosis not present

## 2020-04-21 DIAGNOSIS — K2970 Gastritis, unspecified, without bleeding: Secondary | ICD-10-CM | POA: Diagnosis not present

## 2020-04-21 DIAGNOSIS — Z791 Long term (current) use of non-steroidal anti-inflammatories (NSAID): Secondary | ICD-10-CM | POA: Insufficient documentation

## 2020-04-21 DIAGNOSIS — F17210 Nicotine dependence, cigarettes, uncomplicated: Secondary | ICD-10-CM | POA: Insufficient documentation

## 2020-04-21 DIAGNOSIS — K635 Polyp of colon: Secondary | ICD-10-CM

## 2020-04-21 DIAGNOSIS — R1310 Dysphagia, unspecified: Secondary | ICD-10-CM | POA: Diagnosis not present

## 2020-04-21 DIAGNOSIS — Z800 Family history of malignant neoplasm of digestive organs: Secondary | ICD-10-CM | POA: Diagnosis not present

## 2020-04-21 DIAGNOSIS — K222 Esophageal obstruction: Secondary | ICD-10-CM | POA: Diagnosis not present

## 2020-04-21 DIAGNOSIS — K2270 Barrett's esophagus without dysplasia: Secondary | ICD-10-CM | POA: Insufficient documentation

## 2020-04-21 DIAGNOSIS — Z1211 Encounter for screening for malignant neoplasm of colon: Secondary | ICD-10-CM | POA: Diagnosis not present

## 2020-04-21 DIAGNOSIS — D125 Benign neoplasm of sigmoid colon: Secondary | ICD-10-CM | POA: Diagnosis not present

## 2020-04-21 DIAGNOSIS — Z803 Family history of malignant neoplasm of breast: Secondary | ICD-10-CM | POA: Diagnosis not present

## 2020-04-21 DIAGNOSIS — Z86010 Personal history of colonic polyps: Secondary | ICD-10-CM | POA: Insufficient documentation

## 2020-04-21 DIAGNOSIS — D123 Benign neoplasm of transverse colon: Secondary | ICD-10-CM | POA: Diagnosis not present

## 2020-04-21 DIAGNOSIS — Z86718 Personal history of other venous thrombosis and embolism: Secondary | ICD-10-CM | POA: Diagnosis not present

## 2020-04-21 DIAGNOSIS — K5730 Diverticulosis of large intestine without perforation or abscess without bleeding: Secondary | ICD-10-CM | POA: Diagnosis not present

## 2020-04-21 DIAGNOSIS — K2100 Gastro-esophageal reflux disease with esophagitis, without bleeding: Secondary | ICD-10-CM | POA: Insufficient documentation

## 2020-04-21 DIAGNOSIS — Z8673 Personal history of transient ischemic attack (TIA), and cerebral infarction without residual deficits: Secondary | ICD-10-CM | POA: Insufficient documentation

## 2020-04-21 DIAGNOSIS — Z79899 Other long term (current) drug therapy: Secondary | ICD-10-CM | POA: Diagnosis not present

## 2020-04-21 DIAGNOSIS — K648 Other hemorrhoids: Secondary | ICD-10-CM | POA: Insufficient documentation

## 2020-04-21 HISTORY — PX: COLONOSCOPY WITH PROPOFOL: SHX5780

## 2020-04-21 HISTORY — PX: BALLOON DILATION: SHX5330

## 2020-04-21 HISTORY — PX: BIOPSY: SHX5522

## 2020-04-21 HISTORY — PX: POLYPECTOMY: SHX5525

## 2020-04-21 HISTORY — PX: ESOPHAGOGASTRODUODENOSCOPY (EGD) WITH PROPOFOL: SHX5813

## 2020-04-21 SURGERY — COLONOSCOPY WITH PROPOFOL
Anesthesia: General

## 2020-04-21 MED ORDER — MIDAZOLAM HCL 5 MG/5ML IJ SOLN
INTRAMUSCULAR | Status: DC | PRN
  Administered 2020-04-21: 2 mg via INTRAVENOUS

## 2020-04-21 MED ORDER — LACTATED RINGERS IV SOLN: INTRAVENOUS | Status: DC

## 2020-04-21 MED ORDER — MIDAZOLAM HCL 2 MG/2ML IJ SOLN
INTRAMUSCULAR | Status: AC
  Filled 2020-04-21: qty 2

## 2020-04-21 MED ORDER — PROPOFOL 10 MG/ML IV BOLUS
INTRAVENOUS | Status: DC | PRN
  Administered 2020-04-21: 150 ug/kg/min via INTRAVENOUS
  Administered 2020-04-21: 100 mg via INTRAVENOUS

## 2020-04-21 NOTE — Interval H&P Note (Signed)
History and Physical Interval Note:  04/21/2020 10:06 AM  Adam Wheeler  has presented today for surgery, with the diagnosis of dysphagia, hx colon polyps.  The various methods of treatment have been discussed with the patient and family. After consideration of risks, benefits and other options for treatment, the patient has consented to  Procedure(s) with comments: COLONOSCOPY WITH PROPOFOL (N/A) - 11:00am ESOPHAGOGASTRODUODENOSCOPY (EGD) WITH PROPOFOL (N/A) BALLOON DILATION (N/A) as a surgical intervention.  The patient's history has been reviewed, patient examined, no change in status, stable for surgery.  I have reviewed the patient's chart and labs.  Questions were answered to the patient's satisfaction.     Lanelle Bal

## 2020-04-21 NOTE — Op Note (Signed)
[facility] Patient Name: Adam Wheeler Procedure Date: 04/21/2020 11:10 AM MRN: [id number] Date of Birth: [birth year] Attending MD: Elon Alas. Edgar Frisk CSN: [id number] Age: 65 Admit Type: Outpatient Procedure:                Colonoscopy Indications:              High risk colon cancer surveillance: Personal                            history of colonic polyps Providers:                Elon Alas. Fredrico Beedle, DO, Otis Peak B. Sharon Seller, RN,                            Caprice Kluver, Nelma Rothman, Technician Referring MD:              Medicines:                See the Anesthesia note for documentation of the                            administered medications Complications:            No immediate complications. Estimated Blood Loss:     Estimated blood loss was minimal. Procedure:                Pre-Anesthesia Assessment:                           - The anesthesia plan was to use monitored                            anesthesia care (MAC).                           After obtaining informed consent, the colonoscope                            was passed under direct vision. Throughout the                            procedure, the patient's blood pressure, pulse, and                            oxygen saturations were monitored continuously. The                            [id number]-[id number]) was introduced through the anus                            and advanced to the the terminal ileum, with                            identification of the appendiceal orifice and IC                            valve. The colonoscopy was  performed without                            difficulty. The patient tolerated the procedure                            well. The quality of the bowel preparation was                            evaluated using the BBPS [facility] Bowel Preparation                            Scale) with scores of: Right Colon = 3, Transverse                            Colon = 3 and Left Colon = 3  (entire mucosa seen                            well with no residual staining, small fragments of                            stool or opaque liquid). The total BBPS score                            equals 9. Scope In: 11:13:23 AM Scope Out: 11:23:44 AM Scope Withdrawal Time: 0 hours 8 minutes 46 seconds  Total Procedure Duration: 0 hours 10 minutes 21 seconds  Findings:      The perianal and digital rectal examinations were normal.      Non-bleeding internal hemorrhoids were found during endoscopy.      Multiple small-mouthed diverticula were found in the sigmoid colon.      A 9 mm polyp was found in the sigmoid colon. The polyp was pedunculated.       The polyp was removed with a cold snare. Resection and retrieval were       complete.      A 4 mm polyp was found in the transverse colon. The polyp was sessile.       The polyp was removed with a cold snare. Resection and retrieval were       complete.      The terminal ileum appeared normal. Impression:               - Non-bleeding internal hemorrhoids.                           - Diverticulosis in the sigmoid colon.                           - One 9 mm polyp in the sigmoid colon, removed with                            a cold snare. Resected and retrieved.                           - One 4 mm polyp in the transverse colon,  removed                            with a cold snare. Resected and retrieved. Moderate Sedation:      Per Anesthesia Care Recommendation:           - Patient has a contact number available for                            emergencies. The signs and symptoms of potential                            delayed complications were discussed with the                            patient. Return to normal activities tomorrow.                            Written discharge instructions were provided to the                            patient.                           - Resume previous diet.                           - Continue  present medications.                           - Await pathology results.                           - Repeat colonoscopy in 5 years for surveillance.                           - Return to GI clinic in 3 months. Procedure Code(s):        --- Professional ---                           [phone], Colonoscopy, flexible; with removal of                            tumor(s), polyp(s), or other lesion(s) by snare                            technique Diagnosis Code(s):        --- Professional ---                           Z86.010, Personal history of colonic polyps                           K64.8, Other hemorrhoids                           K63.5, Polyp of colon  K57.30, Diverticulosis of large intestine without                            perforation or abscess without bleeding CPT copyright 2019 [employer]. All rights reserved. The codes documented in this report are preliminary and upon coder review may  be revised to meet current compliance requirements. Elon Alas. Abbey Chatters, DO [place] Abbey Chatters, DO 04/21/2020 11:28:46 AM This report has been signed electronically. Number of Addenda: 0

## 2020-04-21 NOTE — Anesthesia Preprocedure Evaluation (Signed)
Anesthesia Evaluation  Patient identified by MRN, date of birth, ID band Patient awake    Reviewed: Allergy & Precautions, H&P , NPO status , Patient's Chart, lab work & pertinent test results, reviewed documented beta blocker date and time   Airway Mallampati: II  TM Distance: >3 FB Neck ROM: full    Dental no notable dental hx.    Pulmonary sleep apnea , COPD, Current Smoker,    Pulmonary exam normal breath sounds clear to auscultation       Cardiovascular Exercise Tolerance: Good hypertension, negative cardio ROS   Rhythm:regular Rate:Normal     Neuro/Psych  Headaches, Seizures -, Poorly Controlled,  PSYCHIATRIC DISORDERS Anxiety Depression CVA, No Residual Symptoms    GI/Hepatic Neg liver ROS, GERD  Medicated,  Endo/Other  negative endocrine ROS  Renal/GU negative Renal ROS  negative genitourinary   Musculoskeletal   Abdominal   Peds  Hematology negative hematology ROS (+)   Anesthesia Other Findings   Reproductive/Obstetrics negative OB ROS                             Anesthesia Physical Anesthesia Plan  ASA: III  Anesthesia Plan: General   Post-op Pain Management:    Induction:   PONV Risk Score and Plan: Propofol infusion and Midazolam  Airway Management Planned:   Additional Equipment:   Intra-op Plan:   Post-operative Plan:   Informed Consent: I have reviewed the patients History and Physical, chart, labs and discussed the procedure including the risks, benefits and alternatives for the proposed anesthesia with the patient or authorized representative who has indicated his/her understanding and acceptance.     Dental Advisory Given  Plan Discussed with: CRNA  Anesthesia Plan Comments:         Anesthesia Quick Evaluation

## 2020-04-21 NOTE — Transfer of Care (Signed)
Immediate Anesthesia Transfer of Care Note  Patient: Adam Wheeler  Procedure(s) Performed: COLONOSCOPY WITH PROPOFOL (N/A ) ESOPHAGOGASTRODUODENOSCOPY (EGD) WITH PROPOFOL (N/A ) BALLOON DILATION (N/A ) BIOPSY POLYPECTOMY  Patient Location: Endoscopy Unit  Anesthesia Type:General  Level of Consciousness: awake, alert , oriented and patient cooperative  Airway & Oxygen Therapy: Patient Spontanous Breathing  Post-op Assessment: Report given to RN, Post -op Vital signs reviewed and stable and Patient moving all extremities  Post vital signs: Reviewed and stable  Last Vitals:  Vitals Value Taken Time  BP    Temp    Pulse    Resp    SpO2      Last Pain:  Vitals:   04/21/20 1053  TempSrc:   PainSc: 0-No pain      Patients Stated Pain Goal: 6 (04/21/20 0956)  Complications: No complications documented.

## 2020-04-21 NOTE — Op Note (Signed)
[facility] Patient Name: Adam Wheeler Procedure Date: 04/21/2020 10:45 AM MRN: [id number] Date of Birth: [birth year] Attending MD: Elon Alas. Abbey Chatters DO CSN: [id number] Age: 65 Admit Type: Outpatient Procedure:                Upper GI endoscopy Indications:              Dysphagia Providers:                Elon Alas. Shanayah Kaffenberger, DO, Otis Peak B. Sharon Seller, RN,                            Caprice Kluver, Nelma Rothman, Technician Referring MD:              Medicines:                See the Anesthesia note for documentation of the                            administered medications Complications:            No immediate complications. Estimated Blood Loss:     Estimated blood loss was minimal. Procedure:                Pre-Anesthesia Assessment:                           - The anesthesia plan was to use monitored                            anesthesia care (MAC).                           After obtaining informed consent, the endoscope was                            passed under direct vision. Throughout the                            procedure, the patient's blood pressure, pulse, and                            oxygen saturations were monitored continuously. The                            [id number]-H190 ([id number]) scope was introduced through the                            mouth, and advanced to the second part of duodenum.                            The upper GI endoscopy was accomplished without                            difficulty. The patient tolerated the procedure                            well. Scope In:  10:57:01 AM Scope Out: 11:07:44 AM Total Procedure Duration: 0 hours 10 minutes 43 seconds  Findings:      A mild Schatzki ring was found in the lower third of the esophagus. A       TTS dilator was passed through the scope. Dilation with an 18-19-20 mm       balloon dilator was performed to 18 mm. The dilation site was examined       and showed moderate improvement in luminal narrowing.  Schatzki ring was       then obliterated with biopsy forceps.      LA Grade A (one or more mucosal breaks less than 5 mm, not extending       between tops of 2 mucosal folds) esophagitis with no bleeding was found       at the gastroesophageal junction.      Diffuse mild inflammation characterized by erythema was found in the       entire examined stomach. Biopsies were taken with a cold forceps for       Helicobacter pylori testing.      The duodenal bulb, first portion of the duodenum and second portion of       the duodenum were normal. Impression:               - Mild Schatzki ring. Dilated.                           - LA Grade A reflux esophagitis with no bleeding.                           - Gastritis. Biopsied.                           - Normal duodenal bulb, first portion of the                            duodenum and second portion of the duodenum. Moderate Sedation:      Per Anesthesia Care Recommendation:           - Patient has a contact number available for                            emergencies. The signs and symptoms of potential                            delayed complications were discussed with the                            patient. Return to normal activities tomorrow.                            Written discharge instructions were provided to the                            patient.                           - Resume previous diet.                           -  Continue present medications.                           - Await pathology results.                           - Repeat upper endoscopy PRN for retreatment.                           - Return to GI clinic in 3 months.                           - Use Prilosec (omeprazole) 20 mg PO BID. Procedure Code(s):        --- Professional ---                           [phone], Esophagogastroduodenoscopy, flexible,                            transoral; with transendoscopic balloon dilation of                             esophagus (less than 30 mm diameter)                           [id number], 59, Esophagogastroduodenoscopy, flexible,                            transoral; with biopsy, single or multiple Diagnosis Code(s):        --- Professional ---                           K22.2, Esophageal obstruction                           K21.00, Gastro-esophageal reflux disease with                            esophagitis, without bleeding                           K29.70, Gastritis, unspecified, without bleeding                           R13.10, Dysphagia, unspecified CPT copyright 2019 American Medical Association. All rights reserved. The codes documented in this report are preliminary and upon coder review may  be revised to meet current compliance requirements. Elon Alas. Abbey Chatters, DO [place] Abbey Chatters, DO 04/21/2020 11:12:36 AM This report has been signed electronically. Number of Addenda: 0

## 2020-04-21 NOTE — Discharge Instructions (Addendum)
EGD Discharge instructions Please read the instructions outlined below and refer to this sheet in the next few weeks. These discharge instructions provide you with general information on caring for yourself after you leave the hospital. Your doctor may also give you specific instructions. While your treatment has been planned according to the most current medical practices available, unavoidable complications occasionally occur. If you have any problems or questions after discharge, please call your doctor. ACTIVITY  You may resume your regular activity but move at a slower pace for the next 24 hours.   Take frequent rest periods for the next 24 hours.   Walking will help expel (get rid of) the air and reduce the bloated feeling in your abdomen.   No driving for 24 hours (because of the anesthesia (medicine) used during the test).   You may shower.   Do not sign any important legal documents or operate any machinery for 24 hours (because of the anesthesia used during the test).  NUTRITION  Drink plenty of fluids.   You may resume your normal diet.   Begin with a light meal and progress to your normal diet.   Avoid alcoholic beverages for 24 hours or as instructed by your caregiver.  MEDICATIONS  You may resume your normal medications unless your caregiver tells you otherwise.  WHAT YOU CAN EXPECT TODAY  You may experience abdominal discomfort such as a feeling of fullness or "gas" pains.  FOLLOW-UP  Your doctor will discuss the results of your test with you.  SEEK IMMEDIATE MEDICAL ATTENTION IF ANY OF THE FOLLOWING OCCUR:  Excessive nausea (feeling sick to your stomach) and/or vomiting.   Severe abdominal pain and distention (swelling).   Trouble swallowing.   Temperature over 101 F (37.8 C).   Rectal bleeding or vomiting of blood.     Colonoscopy Discharge Instructions  Read the instructions outlined below and refer to this sheet in the next few weeks. These  discharge instructions provide you with general information on caring for yourself after you leave the hospital. Your doctor may also give you specific instructions. While your treatment has been planned according to the most current medical practices available, unavoidable complications occasionally occur.   ACTIVITY  You may resume your regular activity, but move at a slower pace for the next 24 hours.   Take frequent rest periods for the next 24 hours.   Walking will help get rid of the air and reduce the bloated feeling in your belly (abdomen).   No driving for 24 hours (because of the medicine (anesthesia) used during the test).    Do not sign any important legal documents or operate any machinery for 24 hours (because of the anesthesia used during the test).  NUTRITION  Drink plenty of fluids.   You may resume your normal diet as instructed by your doctor.   Begin with a light meal and progress to your normal diet. Heavy or fried foods are harder to digest and may make you feel sick to your stomach (nauseated).   Avoid alcoholic beverages for 24 hours or as instructed.  MEDICATIONS  You may resume your normal medications unless your doctor tells you otherwise.  WHAT YOU CAN EXPECT TODAY  Some feelings of bloating in the abdomen.   Passage of more gas than usual.   Spotting of blood in your stool or on the toilet paper.  IF YOU HAD POLYPS REMOVED DURING THE COLONOSCOPY:  No aspirin products for 7 days or as instructed.  No alcohol for 7 days or as instructed.   Eat a soft diet for the next 24 hours.  FINDING OUT THE RESULTS OF YOUR TEST Not all test results are available during your visit. If your test results are not back during the visit, make an appointment with your caregiver to find out the results. Do not assume everything is normal if you have not heard from your caregiver or the medical facility. It is important for you to follow up on all of your test results.   SEEK IMMEDIATE MEDICAL ATTENTION IF:  You have more than a spotting of blood in your stool.   Your belly is swollen (abdominal distention).   You are nauseated or vomiting.   You have a temperature over 101.   You have abdominal pain or discomfort that is severe or gets worse throughout the day.   Your EGD showed moderate amount inflammation in your stomach.  I biopsied this to rule out infection with bacteria called H. pylori.  You also have a ring in your esophagus with a narrowing.  I obliterated this with biopsy forceps as well as dilated your esophagus with a balloon.  Hopefully this helps with your swallowing.  I want you to take Nexium 40 mg every day.  Avoid NSAIDs as best you can.    Your colonoscopy revealed 2 polyp(s) which I removed successfully. Await pathology results, my office will contact you. I recommend repeating colonoscopy in 5 years for surveillance purposes.   Follow-up with GI in 3 months.   I hope you have a great rest of your week!  Hennie Duos. Marletta Lor, D.O. [facility]

## 2020-04-21 NOTE — Anesthesia Postprocedure Evaluation (Signed)
Anesthesia Post Note  Patient: Adam Wheeler  Procedure(s) Performed: COLONOSCOPY WITH PROPOFOL (N/A ) ESOPHAGOGASTRODUODENOSCOPY (EGD) WITH PROPOFOL (N/A ) BALLOON DILATION (N/A ) BIOPSY POLYPECTOMY  Patient location during evaluation: Endoscopy Anesthesia Type: General Level of consciousness: awake, oriented, awake and alert and patient cooperative Pain management: pain level controlled Vital Signs Assessment: post-procedure vital signs reviewed and stable Respiratory status: spontaneous breathing, respiratory function stable and nonlabored ventilation Cardiovascular status: blood pressure returned to baseline and stable Postop Assessment: no headache and no backache Anesthetic complications: no   No complications documented.   Last Vitals:  Vitals:   04/21/20 0956  BP: (!) 176/96  Resp: 19  Temp: 36.6 C  SpO2: 99%    Last Pain:  Vitals:   04/21/20 1053  TempSrc:   PainSc: 0-No pain                 Brynda Peon

## 2020-04-22 ENCOUNTER — Other Ambulatory Visit: Payer: Self-pay

## 2020-04-22 LAB — SURGICAL PATHOLOGY

## 2020-04-26 ENCOUNTER — Other Ambulatory Visit: Payer: Self-pay

## 2020-04-26 ENCOUNTER — Encounter: Payer: Self-pay | Admitting: Orthopaedic Surgery

## 2020-04-26 ENCOUNTER — Ambulatory Visit: Payer: Medicare Other

## 2020-04-26 ENCOUNTER — Ambulatory Visit (INDEPENDENT_AMBULATORY_CARE_PROVIDER_SITE_OTHER): Payer: Medicare Other | Admitting: Orthopaedic Surgery

## 2020-04-26 VITALS — BP 197/112 | HR 77 | Ht 70.0 in | Wt 157.0 lb

## 2020-04-26 DIAGNOSIS — M25511 Pain in right shoulder: Secondary | ICD-10-CM

## 2020-04-26 DIAGNOSIS — G8929 Other chronic pain: Secondary | ICD-10-CM

## 2020-04-26 DIAGNOSIS — M25561 Pain in right knee: Secondary | ICD-10-CM | POA: Diagnosis not present

## 2020-04-26 DIAGNOSIS — F17210 Nicotine dependence, cigarettes, uncomplicated: Secondary | ICD-10-CM

## 2020-04-26 MED ORDER — OXYCODONE-ACETAMINOPHEN 5-325 MG PO TABS: ORAL_TABLET | ORAL | 0 refills | Status: DC

## 2020-04-26 NOTE — Patient Instructions (Signed)

## 2020-04-26 NOTE — Progress Notes (Signed)
Dg  

## 2020-04-26 NOTE — Progress Notes (Signed)
Patient XF:GHWEXHB Adam Wheeler, male DOB:Nov 03, 1955, 65 y.o. ZJI:[id number]  Chief Complaint  Patient presents with  . Shoulder Pain    R/hurting Adam Wheeler an injection    HPI  Adam Wheeler is a 65 y.o. male who has chronic pain of the right shoulder. He has pain with overhead use and sleeping on it.  He is taking his medicine.  He has no redness or new trauma.  He hurt his right knee on Christmas Day. He has been having swelling of the knee, popping and medial pain.  He has no giving way.  He has used ice and ibuprofen with some help.     Body mass index is 22.53 kg/m.  ROS  Review of Systems  Constitutional: Positive for activity change.  Respiratory: Positive for shortness of breath.   Musculoskeletal: Positive for arthralgias, gait problem and joint swelling.  Neurological: Positive for seizures.  All other systems reviewed and are negative.   All other systems reviewed and are negative.  The following is a summary of the past history medically, past history surgically, known current medicines, social history and family history.  This information is gathered electronically by the computer from prior information and documentation.  I review this each visit and have found including this information at this point in the chart is beneficial and informative.    Past Medical History:  Diagnosis Date  . Ankle fracture, right 06/2012  . Anxiety   . Arthritis   . Colon polyps    adenomatous polyps on multiple colonoscopies, starting in his early 39s  . COPD (chronic obstructive pulmonary disease) ([place])   . Depression   . History of DVT (deep vein thrombosis)   . Hypertension   . Kidney stones   . Seizures ([place])    has seizures weekly  . Sleep apnea   . Stroke [facility]) 2005   post knee surgery in 2005    Past Surgical History:  Procedure Laterality Date  . BIOPSY N/A 07/22/2012   Procedure: BIOPSY;  Surgeon: Danie Binder, MD;  Location: [facility];  Service: Endoscopy;   Laterality: N/A;  . COLONOSCOPY  06/08/2004   Dr. Rehman:Small polyp cold snared from transverse colon/ Small external hemorrhoids  . COLONOSCOPY  06/16/09   FBP:ZWCHE internal hemorrhoids/simple adenomas (four polyps removed). Random colon biopsies negative for microscopic colitis. Screening colonoscopy in 5 years with propofol recommended by Dr. Oneida Alar.  . COLONOSCOPY WITH PROPOFOL N/A 07/22/2012   Negative colonic and small bowel biopsies. Moderate sized internal hemorrhoids.   . ESOPHAGOGASTRODUODENOSCOPY  06/16/09   SLF: distal peptic stricture, mild erythema, esophagus dilated to 16 mm. Patchy erythema in the antrum with occasional erosion with active oozing. Patchy erythema in the duodenal bulb. Small bowel biopsies benign. Minimal chronic gastritis with no H. pylori.  . ESOPHAGOGASTRODUODENOSCOPY (EGD) WITH PROPOFOL N/A 07/22/2012   gastritis. EGD with stricture at GE junction, moderate non-erosive gastritis, mild duodenitis, negative H.pylori   . left knee surgery  2005  . SAVORY DILATION N/A 07/22/2012   Procedure: SAVORY DILATION;  Surgeon: Danie Binder, MD;  Location: [facility];  Service: Endoscopy;  Laterality: N/A;  15, 16, 17    Family History  Problem Relation Age of Onset  . Colon cancer Mother        diagnosed in her 4s, deceased at 69  . Breast cancer Mother   . Cancer Father        deceased age 9, metastatic cancer ?stomach  . Colon cancer Other  2 maternal uncles and one first cousin    Social History Social History   Tobacco Use  . Smoking status: Current Every Day Smoker    Packs/day: 0.25    Years: 40.00    Pack years: 10.00    Types: Cigarettes  . Smokeless tobacco: Never Used  . Tobacco comment: 1-2 cigarettes a day, some days none  Vaping Use  . Vaping Use: Never used  Substance Use Topics  . Alcohol use: No  . Drug use: No    Allergies  Allergen Reactions  . Lortab [Hydrocodone-Acetaminophen] Nausea And Vomiting    Current Outpatient  Medications  Medication Sig Dispense Refill  . albuterol (VENTOLIN HFA) 108 (90 Base) MCG/ACT inhaler Inhale 2 puffs into the lungs every 6 (six) hours as needed for wheezing or shortness of breath. 1 each 3  . ARIPiprazole (ABILIFY) 2 MG tablet Take 2 mg by mouth daily.    Marland Kitchen bismuth subsalicylate (PEPTO BISMOL) 262 MG/15ML suspension Take 30 mLs by mouth every 6 (six) hours as needed.    . diazepam (VALIUM) 10 MG tablet Take 10 mg by mouth daily.    . diclofenac (VOLTAREN) 75 MG EC tablet Take 1 tablet (75 mg total) by mouth 2 (two) times daily with a meal. 60 tablet 5  . dicyclomine (BENTYL) 10 MG capsule Take 1 capsule (10 mg total) by mouth 4 (four) times daily -  before meals and at bedtime. As needed for looser stool. Monitor for constipation, dry mouth, dizziness. (Patient taking differently: Take 10 mg by mouth 3 (three) times daily as needed for spasms. As needed for looser stool. Monitor for constipation, dry mouth, dizziness.) 120 capsule 3  . famotidine (PEPCID) 20 MG tablet One after supper (Patient taking differently: Take 20 mg by mouth every evening. One after supper) 30 tablet 11  . FLUoxetine (PROZAC) 40 MG capsule Take 40 mg by mouth every morning.    Marland Kitchen lisinopril (ZESTRIL) 10 MG tablet Take 1 tablet by mouth daily.    Marland Kitchen NEXIUM 40 MG capsule Take 40 mg by mouth daily as needed (heart burn).    . TRELEGY ELLIPTA 100-62.5-25 MCG/INH AEPB INHALE 1 PUFFS BY MOUTH ONCE DAILY (Patient taking differently: Inhale 1 puff into the lungs daily.) 60 each 3  . oxyCODONE-acetaminophen (PERCOCET/ROXICET) 5-325 MG tablet One tablet every six hours as needed for pain. 25 tablet 0   No current facility-administered medications for this visit.     Physical Exam  Blood pressure (!) 197/112, pulse 77, height 5\' 10"  (1.778 m), weight 157 lb (71.2 kg).  Constitutional: overall normal hygiene, normal nutrition, well developed, normal grooming, normal body habitus. Assistive  device:none  Musculoskeletal: gait and station Limp right, muscle tone and strength are normal, no tremors or atrophy is present.  .  Neurological: coordination overall normal.  Deep tendon reflex/nerve stretch intact.  Sensation normal.  Cranial nerves II-XII intact.   Skin:   Normal overall no scars, lesions, ulcers or rashes. No psoriasis.  Psychiatric: Alert and oriented x 3.  Recent memory intact, remote memory unclear.  Normal mood and affect. Well groomed.  Good eye contact.  Cardiovascular: overall no swelling, no varicosities, no edema bilaterally, normal temperatures of the legs and arms, no clubbing, cyanosis and good capillary refill.  Lymphatic: palpation is normal.  Right shoulder has pain in the extremes, NV intact, no swelling.  The right knee has effusion, ROM 0 to 110, pain medially, stable, limp right, NV intact.  All other  systems reviewed and are negative   The patient has been educated about the nature of the problem(s) and counseled on treatment options.  The patient appeared to understand what I have discussed and is in agreement with it.  Encounter Diagnoses  Name Primary?  . Acute pain of right knee Yes  . Chronic right shoulder pain   . Nicotine dependence, cigarettes, uncomplicated    X-rays were done of the right knee, reported separately.  He declines injection of the knee.  PROCEDURE NOTE:  The patient request injection, verbal consent was obtained.  The right shoulder was prepped appropriately after time out was performed.   Sterile technique was observed and injection of 1 cc of Depo-Medrol 40 mg with several cc's of plain xylocaine. Anesthesia was provided by ethyl chloride and a 20-gauge needle was used to inject the shoulder area. A posterior approach was used.  The injection was tolerated well.  A band aid dressing was applied.  The patient was advised to apply ice later today and tomorrow to the injection sight as  needed.    PLAN Call if any problems.  Precautions discussed.  Continue current medications.   Return to clinic 2 months   I have reviewed the [place] web site prior to prescribing narcotic medicine for this patient.   Electronically Signed Adam Kava, MD 1/11/20228:31 AM

## 2020-04-27 NOTE — Progress Notes (Signed)
Dear [name],   We have tried to reach you on several occasions regarding our results. Please give our office a call at your earliest convenience at [phone].     Thank you Floria Raveling, CMA

## 2020-05-16 ENCOUNTER — Telehealth: Payer: Self-pay | Admitting: Internal Medicine

## 2020-05-16 NOTE — Telephone Encounter (Signed)
See result note.  

## 2020-05-16 NOTE — Telephone Encounter (Signed)
Patient called asking about results, received a letter we could not reach him

## 2020-05-17 ENCOUNTER — Telehealth: Payer: Self-pay | Admitting: Internal Medicine

## 2020-05-17 NOTE — Telephone Encounter (Signed)
See result note.  

## 2020-05-17 NOTE — Telephone Encounter (Signed)
Patient returned call for results 

## 2020-05-19 ENCOUNTER — Encounter (HOSPITAL_COMMUNITY): Payer: Self-pay | Admitting: Internal Medicine

## 2020-05-25 ENCOUNTER — Telehealth: Payer: Self-pay | Admitting: Orthopaedic Surgery

## 2020-05-25 MED ORDER — OXYCODONE-ACETAMINOPHEN 5-325 MG PO TABS: ORAL_TABLET | ORAL | 0 refills | Status: DC

## 2020-05-25 NOTE — Telephone Encounter (Signed)
Patient called request refill on pain medicine   oxyCODONE-Acetaminophen (Tab) PERCOCET/ROXICET 5-325 MG     Pharmacy:  [facility]

## 2020-05-26 ENCOUNTER — Telehealth: Payer: Self-pay

## 2020-05-26 NOTE — Telephone Encounter (Signed)
Noted  

## 2020-05-26 NOTE — Telephone Encounter (Signed)
FYI: did you ever complete the query and return to the fax number listed for this patient. It was sent to by paper copy but it stated that Electronic Query available in [facility]. I still have the paper copy if you need it.

## 2020-05-26 NOTE — Telephone Encounter (Signed)
Yes - I did    Thanks

## 2020-05-31 ENCOUNTER — Telehealth: Payer: Self-pay | Admitting: Gastroenterology

## 2020-05-31 NOTE — Telephone Encounter (Signed)
Adam Wheeler, can we request outside labs (specifically CBC)?

## 2020-05-31 NOTE — Telephone Encounter (Signed)
I sent a request to his PCP to fax [place] his last CBC

## 2020-06-21 ENCOUNTER — Other Ambulatory Visit: Payer: Self-pay | Admitting: Internal Medicine

## 2020-06-23 ENCOUNTER — Other Ambulatory Visit: Payer: Self-pay

## 2020-06-23 ENCOUNTER — Encounter: Payer: Self-pay | Admitting: Orthopaedic Surgery

## 2020-06-23 ENCOUNTER — Ambulatory Visit (INDEPENDENT_AMBULATORY_CARE_PROVIDER_SITE_OTHER): Payer: Medicare Other | Admitting: Orthopaedic Surgery

## 2020-06-23 VITALS — BP 137/89 | HR 62 | Ht 70.0 in | Wt 156.6 lb

## 2020-06-23 DIAGNOSIS — G8929 Other chronic pain: Secondary | ICD-10-CM

## 2020-06-23 DIAGNOSIS — M25511 Pain in right shoulder: Secondary | ICD-10-CM | POA: Diagnosis not present

## 2020-06-23 DIAGNOSIS — F17210 Nicotine dependence, cigarettes, uncomplicated: Secondary | ICD-10-CM

## 2020-06-23 MED ORDER — OXYCODONE-ACETAMINOPHEN 5-325 MG PO TABS: ORAL_TABLET | ORAL | 0 refills | Status: DC

## 2020-06-23 NOTE — Progress Notes (Signed)
PROCEDURE NOTE:  The patient request injection, verbal consent was obtained.  The right shoulder was prepped appropriately after time out was performed.   Sterile technique was observed and injection of 1 cc of Celestone 6 mg with several cc's of plain xylocaine. Anesthesia was provided by ethyl chloride and a 20-gauge needle was used to inject the shoulder area. A posterior approach was used.  The injection was tolerated well.  A band aid dressing was applied.  The patient was advised to apply ice later today and tomorrow to the injection sight as needed.  I have reviewed the [place] web site prior to prescribing narcotic medicine for this patient.   Return in two months.  Electronically Signed Sanjuana Kava, MD 3/10/20228:07 AM

## 2020-06-29 ENCOUNTER — Encounter: Payer: Self-pay | Admitting: Internal Medicine

## 2020-06-29 ENCOUNTER — Ambulatory Visit: Payer: Medicare Other | Admitting: Gastroenterology

## 2020-07-21 ENCOUNTER — Telehealth: Payer: Self-pay | Admitting: Orthopaedic Surgery

## 2020-07-21 NOTE — Telephone Encounter (Signed)
Patient requests refill - reviewed office policy regarding calling in requests before noon on Thursdays:  oxyCODONE-acetaminophen (PERCOCET/ROXICET) 5-325 MG tablet 25 tablet  [facility]

## 2020-07-25 MED ORDER — OXYCODONE-ACETAMINOPHEN 5-325 MG PO TABS
ORAL_TABLET | ORAL | 0 refills | Status: DC
Start: 2020-07-25 — End: 2020-08-23

## 2020-08-23 ENCOUNTER — Other Ambulatory Visit: Payer: Self-pay

## 2020-08-23 ENCOUNTER — Ambulatory Visit (INDEPENDENT_AMBULATORY_CARE_PROVIDER_SITE_OTHER): Payer: Medicare Other | Admitting: Orthopaedic Surgery

## 2020-08-23 ENCOUNTER — Encounter: Payer: Self-pay | Admitting: Orthopaedic Surgery

## 2020-08-23 DIAGNOSIS — G8929 Other chronic pain: Secondary | ICD-10-CM

## 2020-08-23 DIAGNOSIS — F17210 Nicotine dependence, cigarettes, uncomplicated: Secondary | ICD-10-CM

## 2020-08-23 DIAGNOSIS — M25511 Pain in right shoulder: Secondary | ICD-10-CM | POA: Diagnosis not present

## 2020-08-23 MED ORDER — OXYCODONE-ACETAMINOPHEN 5-325 MG PO TABS
ORAL_TABLET | ORAL | 0 refills | Status: DC
Start: 2020-08-23 — End: 2020-10-03

## 2020-08-23 NOTE — Progress Notes (Signed)
PROCEDURE NOTE:  The patient request injection, verbal consent was obtained.  The right shoulder was prepped appropriately after time out was performed.   Sterile technique was observed and injection of 1 cc of Celestone 6 mg with several cc's of plain xylocaine. Anesthesia was provided by ethyl chloride and a 20-gauge needle was used to inject the shoulder area. A posterior approach was used.  The injection was tolerated well.  A band aid dressing was applied.  The patient was advised to apply ice later today and tomorrow to the injection sight as needed.  I have reviewed the [place] web site prior to prescribing narcotic medicine for this patient.   Return in two months.  Call if any problem.  Precautions discussed.   Electronically Signed Sanjuana Kava, MD 5/10/20229:04 AM

## 2020-09-16 ENCOUNTER — Encounter: Payer: Self-pay | Admitting: Internal Medicine

## 2020-09-16 NOTE — Telephone Encounter (Signed)
error 

## 2020-09-22 ENCOUNTER — Telehealth: Payer: Self-pay | Admitting: Orthopaedic Surgery

## 2020-09-22 NOTE — Telephone Encounter (Signed)
Patient called for refill oxyCODONE-acetaminophen (PERCOCET/ROXICET) 5-325 MG tablet 24 tablet  [facility]  (Reminded patient of policy to call before noon if on a Thursday)

## 2020-09-27 NOTE — Telephone Encounter (Signed)
Called back to patient to notify; reached voice mail; left message to return call. (Note: patient may have been notified by pharmacy)

## 2020-10-03 ENCOUNTER — Telehealth: Payer: Self-pay | Admitting: Orthopaedic Surgery

## 2020-10-03 MED ORDER — OXYCODONE-ACETAMINOPHEN 5-325 MG PO TABS: ORAL_TABLET | ORAL | 0 refills | Status: DC

## 2020-10-03 NOTE — Telephone Encounter (Signed)
oxyCODONE-acetaminophen (PERCOCET/ROXICET) 5-325 MG tablet 24 tablet  [facility]

## 2020-10-25 ENCOUNTER — Ambulatory Visit (INDEPENDENT_AMBULATORY_CARE_PROVIDER_SITE_OTHER): Payer: Medicare Other | Admitting: Orthopaedic Surgery

## 2020-10-25 ENCOUNTER — Other Ambulatory Visit: Payer: Self-pay

## 2020-10-25 ENCOUNTER — Encounter: Payer: Self-pay | Admitting: Orthopaedic Surgery

## 2020-10-25 DIAGNOSIS — G8929 Other chronic pain: Secondary | ICD-10-CM | POA: Diagnosis not present

## 2020-10-25 DIAGNOSIS — M25511 Pain in right shoulder: Secondary | ICD-10-CM

## 2020-10-25 DIAGNOSIS — F17210 Nicotine dependence, cigarettes, uncomplicated: Secondary | ICD-10-CM

## 2020-10-25 NOTE — Progress Notes (Signed)
PROCEDURE NOTE:  The patient request injection, verbal consent was obtained.  The right shoulder was prepped appropriately after time out was performed.   Sterile technique was observed and injection of 1 cc of Celeston 6mg  with several cc's of plain xylocaine. Anesthesia was provided by ethyl chloride and a 20-gauge needle was used to inject the shoulder area. A posterior approach was used.  The injection was tolerated well.  A band aid dressing was applied.  The patient was advised to apply ice later today and tomorrow to the injection sight as needed.   Return in two months.  Call if any problem.  Precautions discussed.  Electronically Signed Sanjuana Kava, MD 7/12/20229:09 AM

## 2020-11-02 ENCOUNTER — Telehealth: Payer: Self-pay | Admitting: Orthopaedic Surgery

## 2020-11-02 NOTE — Telephone Encounter (Signed)
Patient requests prescription for   oxyCODONE-acetaminophen (PERCOCET/ROXICET) 5-325 MG tablet 24 tablet  He states he uses [facility]

## 2020-11-03 MED ORDER — OXYCODONE-ACETAMINOPHEN 5-325 MG PO TABS: ORAL_TABLET | ORAL | 0 refills | Status: DC

## 2020-11-30 ENCOUNTER — Telehealth: Payer: Self-pay | Admitting: Orthopaedic Surgery

## 2020-11-30 NOTE — Telephone Encounter (Signed)
Patient requests refill: oxyCODONE-acetaminophen (PERCOCET/ROXICET) 5-325 MG tablet 20 tablet   [facility]

## 2020-12-01 MED ORDER — OXYCODONE-ACETAMINOPHEN 5-325 MG PO TABS: ORAL_TABLET | ORAL | 0 refills | Status: DC

## 2020-12-27 ENCOUNTER — Encounter: Payer: Self-pay | Admitting: Orthopaedic Surgery

## 2020-12-27 ENCOUNTER — Other Ambulatory Visit: Payer: Self-pay

## 2020-12-27 ENCOUNTER — Ambulatory Visit (INDEPENDENT_AMBULATORY_CARE_PROVIDER_SITE_OTHER): Payer: Medicare Other | Admitting: Orthopaedic Surgery

## 2020-12-27 DIAGNOSIS — N401 Enlarged prostate with lower urinary tract symptoms: Secondary | ICD-10-CM | POA: Insufficient documentation

## 2020-12-27 DIAGNOSIS — N529 Male erectile dysfunction, unspecified: Secondary | ICD-10-CM | POA: Insufficient documentation

## 2020-12-27 DIAGNOSIS — Z8673 Personal history of transient ischemic attack (TIA), and cerebral infarction without residual deficits: Secondary | ICD-10-CM | POA: Insufficient documentation

## 2020-12-27 DIAGNOSIS — R42 Dizziness and giddiness: Secondary | ICD-10-CM | POA: Insufficient documentation

## 2020-12-27 DIAGNOSIS — E785 Hyperlipidemia, unspecified: Secondary | ICD-10-CM | POA: Insufficient documentation

## 2020-12-27 DIAGNOSIS — F324 Major depressive disorder, single episode, in partial remission: Secondary | ICD-10-CM | POA: Insufficient documentation

## 2020-12-27 DIAGNOSIS — G40909 Epilepsy, unspecified, not intractable, without status epilepticus: Secondary | ICD-10-CM | POA: Insufficient documentation

## 2020-12-27 DIAGNOSIS — J189 Pneumonia, unspecified organism: Secondary | ICD-10-CM | POA: Insufficient documentation

## 2020-12-27 DIAGNOSIS — G8929 Other chronic pain: Secondary | ICD-10-CM | POA: Insufficient documentation

## 2020-12-27 DIAGNOSIS — M25519 Pain in unspecified shoulder: Secondary | ICD-10-CM | POA: Insufficient documentation

## 2020-12-27 DIAGNOSIS — M25511 Pain in right shoulder: Secondary | ICD-10-CM

## 2020-12-27 DIAGNOSIS — F17210 Nicotine dependence, cigarettes, uncomplicated: Secondary | ICD-10-CM

## 2020-12-27 MED ORDER — OXYCODONE-ACETAMINOPHEN 5-325 MG PO TABS: ORAL_TABLET | ORAL | 0 refills | Status: DC

## 2020-12-27 NOTE — Patient Instructions (Signed)
Steps to Quit Smoking Smoking tobacco is the leading cause of preventable death. It can affect almost every organ in the body. Smoking puts you and people around you at risk for many serious, long-lasting (chronic) diseases. Quitting smoking can be hard, but it is one of the best things that you can do for your health. It is never too late to quit. How do I get ready to quit? When you decide to quit smoking, make a plan to help you succeed. Before you quit: Pick a date to quit. Set a date within the next 2 weeks to give you time to prepare. Write down the reasons why you are quitting. Keep this list in places where you will see it often. Tell your family, friends, and co-workers that you are quitting. Their support is important. Talk with your doctor about the choices that may help you quit. Find out if your health insurance will pay for these treatments. Know the people, places, things, and activities that make you want to smoke (triggers). Avoid them. What first steps can I take to quit smoking? Throw away all cigarettes at home, at work, and in your car. Throw away the things that you use when you smoke, such as ashtrays and lighters. Clean your car. Make sure to empty the ashtray. Clean your home, including curtains and carpets. What can I do to help me quit smoking? Talk with your doctor about taking medicines and seeing a counselor at the same time. You are more likely to succeed when you do both. If you are pregnant or breastfeeding, talk with your doctor about counseling or other ways to quit smoking. Do not take medicine to help you quit smoking unless your doctor tells you to do so. To quit smoking: Quit right away Quit smoking totally, instead of slowly cutting back on how much you smoke over a period of time. Go to counseling. You are more likely to quit if you go to counseling sessions regularly. Take medicine You may take medicines to help you quit. Some medicines need a  prescription, and some you can buy over-the-counter. Some medicines may contain a drug called nicotine to replace the nicotine in cigarettes. Medicines may: Help you to stop having the desire to smoke (cravings). Help to stop the problems that come when you stop smoking (withdrawal symptoms). Your doctor may ask you to use: Nicotine patches, gum, or lozenges. Nicotine inhalers or sprays. Non-nicotine medicine that is taken by mouth. Find resources Find resources and other ways to help you quit smoking and remain smoke-free after you quit. These resources are most helpful when you use them often. They include: Online chats with a counselor. Phone quitlines. Printed self-help materials. Support groups or group counseling. Text messaging programs. Mobile phone apps. Use apps on your mobile phone or tablet that can help you stick to your quit plan. There are many free apps for mobile phones and tablets as well as websites. Examples include Quit Guide from the CDC and smokefree.gov  What things can I do to make it easier to quit?  Talk to your family and friends. Ask them to support and encourage you. Call a phone quitline (1-800-QUIT-NOW), reach out to support groups, or work with a counselor. Ask people who smoke to not smoke around you. Avoid places that make you want to smoke, such as: Bars. Parties. Smoke-break areas at work. Spend time with people who do not smoke. Lower the stress in your life. Stress can make you want to   smoke. Try these things to help your stress: Getting regular exercise. Doing deep-breathing exercises. Doing yoga. Meditating. Doing a body scan. To do this, close your eyes, focus on one area of your body at a time from head to toe. Notice which parts of your body are tense. Try to relax the muscles in those areas. How will I feel when I quit smoking? Day 1 to 3 weeks Within the first 24 hours, you may start to have some problems that come from quitting tobacco.  These problems are very bad 2-3 days after you quit, but they do not often last for more than 2-3 weeks. You may get these symptoms: Mood swings. Feeling restless, nervous, angry, or annoyed. Trouble concentrating. Dizziness. Strong desire for high-sugar foods and nicotine. Weight gain. Trouble pooping (constipation). Feeling like you may vomit (nausea). Coughing or a sore throat. Changes in how the medicines that you take for other issues work in your body. Depression. Trouble sleeping (insomnia). Week 3 and afterward After the first 2-3 weeks of quitting, you may start to notice more positive results, such as: Better sense of smell and taste. Less coughing and sore throat. Slower heart rate. Lower blood pressure. Clearer skin. Better breathing. Fewer sick days. Quitting smoking can be hard. Do not give up if you fail the first time. Some people need to try a few times before they succeed. Do your best to stick to your quit plan, and talk with your doctor if you have any questions or concerns. Summary Smoking tobacco is the leading cause of preventable death. Quitting smoking can be hard, but it is one of the best things that you can do for your health. When you decide to quit smoking, make a plan to help you succeed. Quit smoking right away, not slowly over a period of time. When you start quitting, seek help from your doctor, family, or friends. This information is not intended to replace advice given to you by your health care provider. Make sure you discuss any questions you have with your health care provider. Document Revised: 12/26/2018 Document Reviewed: 06/21/2018 Elsevier Patient Education  2022 Elsevier Inc.  

## 2020-12-27 NOTE — Progress Notes (Signed)
PROCEDURE NOTE:  The patient request injection, verbal consent was obtained.  The right shoulder was prepped appropriately after time out was performed.   Sterile technique was observed and injection of 1 cc of Celestone 6 mg with several cc's of plain xylocaine. Anesthesia was provided by ethyl chloride and a 20-gauge needle was used to inject the shoulder area. A posterior approach was used.  The injection was tolerated well.  A band aid dressing was applied.  The patient was advised to apply ice later today and tomorrow to the injection sight as needed.   I have reviewed the [facility] web site prior to prescribing narcotic medicine for this patient.  Return in two months.  Call if any problem.  Precautions discussed.  Electronically Signed Sanjuana Kava, MD 9/13/20229:13 AM

## 2021-01-03 ENCOUNTER — Ambulatory Visit: Payer: Medicare Other | Admitting: Internal Medicine

## 2021-01-03 NOTE — Progress Notes (Deleted)
Adam Wheeler, male    DOB: 1956/01/10    MRN: [id number]   Brief patient profile:  21 yowm  Active smoker with new onset doe x 2017 eval by [name] and [name] dx copd and rx trelegy self referred to the pulmonary clinic in [place] 10/01/2019    History of Present Illness  10/01/2019  Pulmonary/ 1st office eval/Adam Wheeler  Chief Complaint  Patient presents with   Pulmonary Consult    Former pt of Dr [name]- COPD. He states today his breathing is "average"- needing samples of trelegy. He states this helps his SOB more than anything.   Dyspnea:  MMRC3 = can't walk 100 yards even at a slow pace at a flat grade s stopping due to sob  Difficulty with [employer] even going slowly and not checking sats/ worse since ran out of trelegy  Cough: none  Sleep: ok 30-45 degrees due to gerd not cough or sob  SABA use: once or twice a week rec Nexium 40 mg   Take  30-60 min before first meal of the day and Pepcid (famotidine)  20 mg one after supper or before bedtime  until return to office - this is the best way to tell whether stomach acid is contributing to your problem.   GERD  No change in trelegy for now - be sure to brush teeth and gargle with arm and hammer toothpaste Only use your albuterol as a rescue medication    01/04/2020  f/u ov/Adam Wheeler re: COPD III/ still smoking maint on trelegy/ vaccinated for covid 70 Chief Complaint  Patient presents with   Follow-up    patient is feeling better since last visit, no concerns at this time.   Dyspnea:  Easier to get across parking lot and thru the store at [employer]  Cough: am only, x 15 min then resolves  Sleeping: ok on 3 in incline  SABA use: once a week maybe  02: none  Rec Keep working on no smoking at all  No change  in medications Only use your albuterol as a rescue medication  NEEDS ALPHA ONE SCREEN    01/03/2021  f/u ov/Adam Wheeler office/Adam Wheeler re: COPD GOLD 3, maint on *** No chief complaint on file.   Dyspnea:  *** Cough: *** Sleeping:  *** SABA use: *** 02: *** Covid status: *** Lung cancer screening: ***   No obvious day to day or daytime variability or assoc excess/ purulent sputum or mucus plugs or hemoptysis or cp or chest tightness, subjective wheeze or overt sinus or hb symptoms.   *** without nocturnal  or early am exacerbation  of respiratory  c/o's or need for noct saba. Also denies any obvious fluctuation of symptoms with weather or environmental changes or other aggravating or alleviating factors except as outlined above   No unusual exposure hx or h/o childhood pna/ asthma or knowledge of premature birth.  Current Allergies, Complete Past Medical History, Past Surgical History, Family History, and Social History were reviewed in [employer] record.  ROS  The following are not active complaints unless bolded Hoarseness, sore throat, dysphagia, dental problems, itching, sneezing,  nasal congestion or discharge of excess mucus or purulent secretions, ear ache,   fever, chills, sweats, unintended wt loss or wt gain, classically pleuritic or exertional cp,  orthopnea pnd or arm/hand swelling  or leg swelling, presyncope, palpitations, abdominal pain, anorexia, nausea, vomiting, diarrhea  or change in bowel habits or change in bladder habits, change in stools or change  in urine, dysuria, hematuria,  rash, arthralgias, visual complaints, headache, numbness, weakness or ataxia or problems with walking or coordination,  change in mood or  memory.        No outpatient medications have been marked as taking for the 01/03/21 encounter (Appointment) with Tanda Rockers, MD.                            Past Medical History:  Diagnosis Date   Ankle fracture, right 06/2012   Anxiety    Arthritis    Colon polyps    adenomatous polyps on multiple colonoscopies, starting in his early 33s   COPD (chronic obstructive pulmonary disease) ([place])    Depression    History of DVT (deep vein  thrombosis)    Hypertension    Kidney stones    Seizures ([place])    has seizures weekly   Sleep apnea    Stroke [facility]) 2005   post knee surgery in 2005         Objective:       01/03/2021        ***   10/01/19 158 lb (71.7 kg)  06/25/19 165 lb (74.8 kg)  06/03/19 163 lb 2 oz (74 kg)      Vital signs reviewed  01/03/2021  - Note at rest 02 sats  ***% on ***   General appearance:    ***      Mild bar ***     Assessment

## 2021-01-30 ENCOUNTER — Telehealth: Payer: Self-pay | Admitting: Orthopaedic Surgery

## 2021-01-30 MED ORDER — OXYCODONE-ACETAMINOPHEN 5-325 MG PO TABS: ORAL_TABLET | ORAL | 0 refills | Status: DC

## 2021-01-30 NOTE — Telephone Encounter (Signed)
Patient called requesting refill for his pain medicine.     oxyCODONE-acetaminophen (PERCOCET/ROXICET) 5-325 MG tablet  Pharmacy:  [facility]

## 2021-02-01 ENCOUNTER — Other Ambulatory Visit: Payer: Self-pay | Admitting: Internal Medicine

## 2021-02-23 ENCOUNTER — Other Ambulatory Visit: Payer: Self-pay

## 2021-02-23 ENCOUNTER — Ambulatory Visit (INDEPENDENT_AMBULATORY_CARE_PROVIDER_SITE_OTHER): Payer: 59 | Admitting: Orthopaedic Surgery

## 2021-02-23 ENCOUNTER — Encounter: Payer: Self-pay | Admitting: Orthopaedic Surgery

## 2021-02-23 DIAGNOSIS — F17210 Nicotine dependence, cigarettes, uncomplicated: Secondary | ICD-10-CM

## 2021-02-23 DIAGNOSIS — M25511 Pain in right shoulder: Secondary | ICD-10-CM | POA: Diagnosis not present

## 2021-02-23 DIAGNOSIS — G8929 Other chronic pain: Secondary | ICD-10-CM

## 2021-02-23 MED ORDER — OXYCODONE-ACETAMINOPHEN 5-325 MG PO TABS
ORAL_TABLET | ORAL | 0 refills | Status: DC
Start: 2021-02-23 — End: 2021-03-30

## 2021-02-23 NOTE — Progress Notes (Signed)
PROCEDURE NOTE:  The patient request injection, verbal consent was obtained.  The right shoulder was prepped appropriately after time out was performed.   Sterile technique was observed and injection of 1 cc of DepoMedrol 40mg  with several cc's of plain xylocaine. Anesthesia was provided by ethyl chloride and a 20-gauge needle was used to inject the shoulder area. A posterior approach was used.  The injection was tolerated well.  A band aid dressing was applied.  The patient was advised to apply ice later today and tomorrow to the injection sight as needed.   Encounter Diagnoses  Name Primary?   Chronic right shoulder pain Yes   Nicotine dependence, cigarettes, uncomplicated    I have reviewed the [place] web site prior to prescribing narcotic medicine for this patient.  Return in two months.  Call if any problem.  Precautions discussed.  Electronically Signed Sanjuana Kava, MD 11/10/20229:06 AM

## 2021-03-03 ENCOUNTER — Other Ambulatory Visit: Payer: Self-pay | Admitting: Internal Medicine

## 2021-03-06 ENCOUNTER — Encounter: Payer: Self-pay | Admitting: Internal Medicine

## 2021-03-06 ENCOUNTER — Other Ambulatory Visit: Payer: Self-pay

## 2021-03-06 ENCOUNTER — Telehealth: Payer: Self-pay | Admitting: Internal Medicine

## 2021-03-06 ENCOUNTER — Ambulatory Visit (INDEPENDENT_AMBULATORY_CARE_PROVIDER_SITE_OTHER): Payer: 59 | Admitting: Internal Medicine

## 2021-03-06 ENCOUNTER — Ambulatory Visit: Payer: Medicaid - Out of State | Admitting: Internal Medicine

## 2021-03-06 VITALS — BP 142/80 | HR 65 | Temp 98.4°F | Ht 70.0 in | Wt 166.0 lb

## 2021-03-06 DIAGNOSIS — J449 Chronic obstructive pulmonary disease, unspecified: Secondary | ICD-10-CM | POA: Diagnosis not present

## 2021-03-06 DIAGNOSIS — F17210 Nicotine dependence, cigarettes, uncomplicated: Secondary | ICD-10-CM | POA: Diagnosis not present

## 2021-03-06 DIAGNOSIS — I10 Essential (primary) hypertension: Secondary | ICD-10-CM

## 2021-03-06 DIAGNOSIS — Z23 Encounter for immunization: Secondary | ICD-10-CM | POA: Diagnosis not present

## 2021-03-06 MED ORDER — TRELEGY ELLIPTA 100-62.5-25 MCG/ACT IN AEPB: 1.0000 | INHALATION_SPRAY | Freq: Every day | RESPIRATORY_TRACT | 0 refills | Status: DC

## 2021-03-06 MED ORDER — TRELEGY ELLIPTA 100-62.5-25 MCG/ACT IN AEPB: INHALATION_SPRAY | RESPIRATORY_TRACT | 5 refills | Status: DC

## 2021-03-06 MED ORDER — LOSARTAN POTASSIUM 50 MG PO TABS: 50.0000 mg | ORAL_TABLET | Freq: Every day | ORAL | 11 refills | Status: DC

## 2021-03-06 MED ORDER — TRELEGY ELLIPTA 100-62.5-25 MCG/ACT IN AEPB: 1.0000 | INHALATION_SPRAY | Freq: Every day | RESPIRATORY_TRACT | 1 refills | Status: DC

## 2021-03-06 NOTE — Telephone Encounter (Signed)
Refill has been sent to the pharmacy per pts request.  Nothing further is needed.  

## 2021-03-06 NOTE — Progress Notes (Signed)
Adam Wheeler, male    DOB: 1956/02/09    MRN: [id number]   Brief patient profile:  57  yowm  Active smoker with new onset doe x 2017 eval by [name] and [name] dx copd and rx trelegy self referred to the pulmonary clinic in [place] 10/01/2019    History of Present Illness  10/01/2019  Pulmonary/ 1st office eval/Finola Rosal  Chief Complaint  Patient presents with   Pulmonary Consult    Former pt of Dr [name]- COPD. He states today his breathing is "average"- needing samples of trelegy. He states this helps his SOB more than anything.   Dyspnea:  MMRC3 = can't walk 100 yards even at a slow pace at a flat grade s stopping due to sob  Difficulty with [employer] even going slowly and not checking sats/ worse since ran out of trelegy  Cough: none  Sleep: ok 30-45 degrees due to gerd not cough or sob  SABA use: once or twice a week rec Nexium 40 mg   Take  30-60 min before first meal of the day and Pepcid (famotidine)  20 mg one after supper or before bedtime  until return to office - this is the best way to tell whether stomach acid is contributing to your problem.   GERD  No change in trelegy for now - be sure to brush teeth and gargle with arm and hammer toothpaste Only use your albuterol as a rescue medication    01/04/2020  f/u ov/Lilyana Lippman re: COPD III/ still smoking maint on trelegy/ vaccinated for covid 43 Chief Complaint  Patient presents with   Follow-up    patient is feeling better since last visit, no concerns at this time.   Dyspnea:  Easier to get across parking lot and thru the store at [employer]  Cough: am only, x 15 min then resolves  Sleeping: ok on 3 in incline  SABA use: once a week maybe  02: none  Rec Keep working on no smoking at all  No change  in medications Only use your albuterol as a rescue medication      03/06/2021  f/u ov/[place] office/Lilton Pare re: GOLD 3 still smoking/ maint on trelegy 100 each am  now on lisinopril  Chief Complaint  Patient presents with    Follow-up    Breathing and productive cough with white mucus have not improved since last ov.  Fllu shot today?   Dyspnea:  slt worse sits down when gets to store Cough: min white mucus/globus sensation Sleeping: noct choking new since started acei  SABA use: none  02: none  Covid status: vax x 4  Lung cancer screening: rec    No obvious day to day or daytime variability or assoc  purulent sputum or mucus plugs or hemoptysis or cp or chest tightness, subjective wheeze or overt sinus or hb symptoms.    Also denies any obvious fluctuation of symptoms with weather or environmental changes or other aggravating or alleviating factors except as outlined above   No unusual exposure hx or h/o childhood pna/ asthma or knowledge of premature birth.  Current Allergies, Complete Past Medical History, Past Surgical History, Family History, and Social History were reviewed in [employer] record.  ROS  The following are not active complaints unless bolded Hoarseness, sore throat, dysphagia, dental problems, itching, sneezing,  nasal congestion or discharge of excess mucus or purulent secretions, ear ache,   fever, chills, sweats, unintended wt loss or wt gain, classically pleuritic or  exertional cp,  orthopnea pnd or arm/hand swelling  or leg swelling, presyncope, palpitations, abdominal pain, anorexia, nausea, vomiting, diarrhea  or change in bowel habits or change in bladder habits, change in stools or change in urine, dysuria, hematuria,  rash, arthralgias, visual complaints, headache, numbness, weakness or ataxia or problems with walking or coordination,  change in mood or  memory.        Current Meds  Medication Sig   albuterol (VENTOLIN HFA) 108 (90 Base) MCG/ACT inhaler Inhale 2 puffs into the lungs every 6 (six) hours as needed for wheezing or shortness of breath.   ARIPiprazole (ABILIFY) 2 MG tablet Take 2 mg by mouth daily.   bismuth subsalicylate (PEPTO BISMOL) 262  MG/15ML suspension Take 30 mLs by mouth every 6 (six) hours as needed.   clonazePAM (KLONOPIN) 1 MG tablet Take 1 mg by mouth 2 (two) times daily as needed.   diazepam (VALIUM) 10 MG tablet Take 10 mg by mouth daily.   diclofenac (VOLTAREN) 75 MG EC tablet Take 1 tablet (75 mg total) by mouth 2 (two) times daily with a meal.   dicyclomine (BENTYL) 10 MG capsule Take 1 capsule (10 mg total) by mouth 4 (four) times daily -  before meals and at bedtime. As needed for looser stool. Monitor for constipation, dry mouth, dizziness. (Patient taking differently: Take 10 mg by mouth 3 (three) times daily as needed for spasms. As needed for looser stool. Monitor for constipation, dry mouth, dizziness.)   famotidine (PEPCID) 20 MG tablet One after supper (Patient taking differently: Take 20 mg by mouth every evening. One after supper)   FLUoxetine (PROZAC) 40 MG capsule Take 40 mg by mouth every morning.   lisinopril (ZESTRIL) 10 MG tablet Take 1 tablet by mouth daily.   NEXIUM 40 MG capsule Take 40 mg by mouth daily as needed (heart burn).   oxyCODONE-acetaminophen (PERCOCET/ROXICET) 5-325 MG tablet One tablet every six hours as needed for pain.   TRELEGY ELLIPTA 100-62.5-25 MCG/ACT AEPB INHALE 1 PUFFS BY MOUTH ONCE DAILY          Past Medical History:  Diagnosis Date   Ankle fracture, right 06/2012   Anxiety    Arthritis    Colon polyps    adenomatous polyps on multiple colonoscopies, starting in his early 17s   COPD (chronic obstructive pulmonary disease) (HCC)    Depression    History of DVT (deep vein thrombosis)    Hypertension    Kidney stones    Seizures ([place])    has seizures weekly   Sleep apnea    Stroke [facility]) 2005   post knee surgery in 2005         Objective:       03/06/2021     166   10/01/19 158 lb (71.7 kg)  06/25/19 165 lb (74.8 kg)  06/03/19 163 lb 2 oz (74 kg)     Vital signs reviewed  03/06/2021  - Note at rest 02 sats  98% on RA   General appearance:    amb  chronically ill appearing wm with mild pseudowheeze       HEENT : pt wearing mask not removed for exam due to covid - 19 concerns.    NECK :  without JVD/Nodes/TM/ nl carotid upstrokes bilaterally   LUNGS: no acc muscle use,  Mild barrel  contour chest wall with bilateral  Distant bs s audible wheeze and  without cough on insp or exp maneuvers  and mild  Hyperresonant  to  percussion bilaterally     CV:  RRR  no s3 or murmur or increase in P2, and no edema   ABD:  soft and nontender with pos end  insp Hoover's  in the supine position. No bruits or organomegaly appreciated, bowel sounds nl  MS:   Nl gait/  ext warm without deformities, calf tenderness, cyanosis or clubbing No obvious joint restrictions   SKIN: warm and dry without lesions    NEURO:  alert, approp, nl sensorium with  no motor or cerebellar deficits apparent.        CXR PA and Lateral:   03/06/2021 :    I personally reviewed images and agree with radiology impression as follows:    Did no go for cxr as rec     Assessment

## 2021-03-06 NOTE — Patient Instructions (Signed)
I recommend you discuss annual CT scanning for lung cancer   Stop lisinopril and take losartan 50 mg daily and have your PCP adjust it  Please remember to go to the  x-ray department  for your tests - we will call you with the results when they are available     The key is to stop smoking completely before smoking completely stops you!  For smoking cessation classes call [phone]      Please schedule a follow up visit in 3 months but call sooner if needed

## 2021-03-06 NOTE — Progress Notes (Deleted)
CASHEL BELLINA, male    DOB: 25-Sep-1955    MRN: [id number]   Brief patient profile:  37  yowm  Active smoker with new onset doe x 2017 eval by [name] and [name] dx copd and rx trelegy self referred to the pulmonary clinic in [place] 10/01/2019    History of Present Illness  10/01/2019  Pulmonary/ 1st office eval/Robbie Rideaux  Chief Complaint  Patient presents with   Pulmonary Consult    Former pt of Dr [name]- COPD. He states today his breathing is "average"- needing samples of trelegy. He states this helps his SOB more than anything.   Dyspnea:  MMRC3 = can't walk 100 yards even at a slow pace at a flat grade s stopping due to sob  Difficulty with [employer] even going slowly and not checking sats/ worse since ran out of trelegy  Cough: none  Sleep: ok 30-45 degrees due to gerd not cough or sob  SABA use: once or twice a week rec Nexium 40 mg   Take  30-60 min before first meal of the day and Pepcid (famotidine)  20 mg one after supper or before bedtime  until return to office - this is the best way to tell whether stomach acid is contributing to your problem.   GERD  No change in trelegy for now - be sure to brush teeth and gargle with arm and hammer toothpaste Only use your albuterol as a rescue medication    01/04/2020  f/u ov/Khadeeja Elden re: COPD III/ still smoking maint on trelegy/ vaccinated for covid 40 Chief Complaint  Patient presents with   Follow-up    patient is feeling better since last visit, no concerns at this time.   Dyspnea:  Easier to get across parking lot and thru the store at [employer]  Cough: am only, x 15 min then resolves  Sleeping: ok on 3 in incline  SABA use: once a week maybe  02: none  Rec Keep working on no smoking at all  No change  in medications Only use your albuterol as a rescue medication      03/06/2021  f/u ov/[place] office/Larie Mathes re: GOLD 3 still smoking/ maint on ***  No chief complaint on file.   Dyspnea:  *** Cough: *** Sleeping: *** SABA  use: *** 02: *** Covid status: *** Lung cancer screening: ***   No obvious day to day or daytime variability or assoc excess/ purulent sputum or mucus plugs or hemoptysis or cp or chest tightness, subjective wheeze or overt sinus or hb symptoms.   *** without nocturnal  or early am exacerbation  of respiratory  c/o's or need for noct saba. Also denies any obvious fluctuation of symptoms with weather or environmental changes or other aggravating or alleviating factors except as outlined above   No unusual exposure hx or h/o childhood pna/ asthma or knowledge of premature birth.  Current Allergies, Complete Past Medical History, Past Surgical History, Family History, and Social History were reviewed in [employer] record.  ROS  The following are not active complaints unless bolded Hoarseness, sore throat, dysphagia, dental problems, itching, sneezing,  nasal congestion or discharge of excess mucus or purulent secretions, ear ache,   fever, chills, sweats, unintended wt loss or wt gain, classically pleuritic or exertional cp,  orthopnea pnd or arm/hand swelling  or leg swelling, presyncope, palpitations, abdominal pain, anorexia, nausea, vomiting, diarrhea  or change in bowel habits or change in bladder habits, change in stools or change  in urine, dysuria, hematuria,  rash, arthralgias, visual complaints, headache, numbness, weakness or ataxia or problems with walking or coordination,  change in mood or  memory.        No outpatient medications have been marked as taking for the 03/06/21 encounter (Appointment) with Tanda Rockers, MD.                            Past Medical History:  Diagnosis Date   Ankle fracture, right 06/2012   Anxiety    Arthritis    Colon polyps    adenomatous polyps on multiple colonoscopies, starting in his early 30s   COPD (chronic obstructive pulmonary disease) ([place])    Depression    History of DVT (deep vein thrombosis)     Hypertension    Kidney stones    Seizures ([place])    has seizures weekly   Sleep apnea    Stroke [facility]) 2005   post knee surgery in 2005         Objective:       03/06/2021     ***   10/01/19 158 lb (71.7 kg)  06/25/19 165 lb (74.8 kg)  06/03/19 163 lb 2 oz (74 kg)     Vital signs reviewed  03/06/2021  - Note at rest 02 sats  ***% on ***   General appearance:    ***     Mild bar***       Assessment

## 2021-03-07 ENCOUNTER — Encounter: Payer: Self-pay | Admitting: Internal Medicine

## 2021-03-07 NOTE — Assessment & Plan Note (Signed)
Active smoker - PFT's by [facility] around 2017 /18 reported "stage 3" - 10/01/2019  After extensive coaching inhaler device,  effectiveness =    90% with elipta >> continue trelegy   - PFT's  11/24/19   FEV1 1.51 (42 % ) ratio 0.35  p 1 % improvement from saba p "inhaler" 6 h prior to study with DLCO  12.75 (46%) corrects to 2.11 (50%)  for alv volume and FV curve classic concave exp flow vol  - alpha one AT screen   01/04/2020 >>> not done  - 03/06/2021  After extensive coaching inhaler device,  effectiveness =    75% continue trelegy and try off acei due to pseudowheeze on exam   Group D in terms of symptom/risk and laba/lama/ICS  therefore appropriate rx at this point >>>  Continue trelegy and approp saba

## 2021-03-07 NOTE — Assessment & Plan Note (Signed)
Counseled re importance of smoking cessation but did not meet time criteria for separate billing    Low-dose CT lung cancer screening is recommended for patients who are 73-63 years of age with a 20+ pack-year history of smoking, and who are currently smoking or quit <=15 years ago. No coughing up blood  No unintentional weight loss of > 15 pounds in the last 6 months   >>> referred back to PCP for consideration for lung cancer screening or can by done thru our office by Eric Form NP

## 2021-03-07 NOTE — Assessment & Plan Note (Signed)
D/c acei 03/06/2021 due to pseudowheeze   In the best review of chronic cough to date ( NEJM 2016 375 [id number]) ,  ACEi are now felt to cause cough in up to  20% of pts which is a 4 fold increase from previous reports and does not include the variety of non-specific complaints we see in pulmonary clinic in pts on ACEi but previously attributed to another dx like  Copd/asthma and  include PNDS, throat and chest congestion, "bronchitis", unexplained dyspnea and noct "strangling" sensations, and hoarseness, but also  atypical /refractory GERD symptoms like dysphagia and "bad heartburn"   The only way I know  to prove this is not an "ACEi Case" is a trial off ACEi x a minimum of 6 weeks then regroup.   >>> try losartan 50 mg daily / f/u PCP          Each maintenance medication was reviewed in detail including emphasizing most importantly the difference between maintenance and prns and under what circumstances the prns are to be triggered using an action plan format where appropriate.  Total time for H and P, chart review, counseling, reviewing dpi/hfa device(s) and generating customized AVS unique to this office visit / same day charting  > 30 min

## 2021-03-15 ENCOUNTER — Ambulatory Visit: Payer: Medicaid - Out of State | Admitting: Internal Medicine

## 2021-03-30 ENCOUNTER — Telehealth: Payer: Self-pay | Admitting: Orthopaedic Surgery

## 2021-03-30 MED ORDER — OXYCODONE-ACETAMINOPHEN 5-325 MG PO TABS: ORAL_TABLET | ORAL | 0 refills | Status: DC

## 2021-03-30 NOTE — Telephone Encounter (Signed)
Patient called requesting refill for his pain medicine.   oxyCODONE-acetaminophen (PERCOCET/ROXICET) 5-325 MG tablet  [place]

## 2021-04-12 ENCOUNTER — Other Ambulatory Visit: Payer: Self-pay | Admitting: Internal Medicine

## 2021-04-25 ENCOUNTER — Emergency Department (HOSPITAL_COMMUNITY)
Admission: EM | Admit: 2021-04-25 | Discharge: 2021-04-25 | Disposition: A | Discharge status: Home / Self Care | Payer: Medicare (Managed Care) | Attending: Student | Admitting: Student

## 2021-04-25 ENCOUNTER — Emergency Department (HOSPITAL_COMMUNITY): Payer: Medicare (Managed Care)

## 2021-04-25 ENCOUNTER — Encounter (HOSPITAL_COMMUNITY): Payer: Self-pay

## 2021-04-25 ENCOUNTER — Ambulatory Visit: Payer: 59 | Admitting: Orthopaedic Surgery

## 2021-04-25 ENCOUNTER — Other Ambulatory Visit: Payer: Self-pay

## 2021-04-25 DIAGNOSIS — Z87891 Personal history of nicotine dependence: Secondary | ICD-10-CM | POA: Insufficient documentation

## 2021-04-25 DIAGNOSIS — J441 Chronic obstructive pulmonary disease with (acute) exacerbation: Secondary | ICD-10-CM | POA: Diagnosis not present

## 2021-04-25 DIAGNOSIS — I10 Essential (primary) hypertension: Secondary | ICD-10-CM | POA: Diagnosis not present

## 2021-04-25 DIAGNOSIS — R0602 Shortness of breath: Secondary | ICD-10-CM | POA: Diagnosis present

## 2021-04-25 LAB — TROPONIN I (HIGH SENSITIVITY)
Troponin I (High Sensitivity): 11 ng/L (ref <18)
Troponin I (High Sensitivity): 9 ng/L (ref <18)

## 2021-04-25 LAB — COMPREHENSIVE METABOLIC PANEL
ALT: 20 U/L (ref 0–44)
AST: 20 U/L (ref 15–41)
Albumin: 3.6 g/dL (ref 3.5–5.0)
Alkaline Phosphatase: 79 U/L (ref 38–126)
Anion gap: 10 (ref 5–15)
BUN: 16 mg/dL (ref 8–23)
CO2: 25 mmol/L (ref 22–32)
Calcium: 8.8 mg/dL — ABNORMAL LOW (ref 8.9–10.3)
Chloride: 104 mmol/L (ref 98–111)
Creatinine, Ser: 0.83 mg/dL (ref 0.61–1.24)
GFR, Estimated: >60 mL/min (ref >60)
Glucose, Bld: 104 mg/dL — ABNORMAL HIGH (ref 70–99)
Potassium: 3.2 mmol/L — ABNORMAL LOW (ref 3.5–5.1)
Sodium: 139 mmol/L (ref 135–145)
Total Bilirubin: 0.2 mg/dL — ABNORMAL LOW (ref 0.3–1.2)
Total Protein: 7.6 g/dL (ref 6.5–8.1)

## 2021-04-25 LAB — BLOOD GAS, VENOUS
Acid-Base Excess: 0.4 mmol/L (ref 0.0–2.0)
Bicarbonate: 23.9 mmol/L (ref 20.0–28.0)
FIO2: 21
O2 Saturation: 66 %
Patient temperature: 36.5
pCO2, Ven: 41.2 mmHg — ABNORMAL LOW (ref 44.0–60.0)
pH, Ven: 7.394 (ref 7.250–7.430)
pO2, Ven: 33.4 mmHg (ref 32.0–45.0)

## 2021-04-25 LAB — CBC WITH DIFFERENTIAL/PLATELET
Abs Immature Granulocytes: 0.05 10*3/uL (ref 0.00–0.07)
Basophils Absolute: 0.1 10*3/uL (ref 0.0–0.1)
Basophils Relative: 0 %
Eosinophils Absolute: 0.1 10*3/uL (ref 0.0–0.5)
Eosinophils Relative: 1 %
HCT: 39.4 % (ref 39.0–52.0)
Hemoglobin: 13.6 g/dL (ref 13.0–17.0)
Immature Granulocytes: 0 %
Lymphocytes Relative: 6 %
Lymphs Abs: 0.9 10*3/uL (ref 0.7–4.0)
MCH: 33.3 pg (ref 26.0–34.0)
MCHC: 34.5 g/dL (ref 30.0–36.0)
MCV: 96.6 fL (ref 80.0–100.0)
Monocytes Absolute: 1.1 10*3/uL — ABNORMAL HIGH (ref 0.1–1.0)
Monocytes Relative: 8 %
Neutro Abs: 11.7 10*3/uL — ABNORMAL HIGH (ref 1.7–7.7)
Neutrophils Relative %: 85 %
Platelets: 439 10*3/uL — ABNORMAL HIGH (ref 150–400)
RBC: 4.08 MIL/uL — ABNORMAL LOW (ref 4.22–5.81)
RDW: 13.4 % (ref 11.5–15.5)
WBC: 13.8 10*3/uL — ABNORMAL HIGH (ref 4.0–10.5)
nRBC: 0 % (ref 0.0–0.2)

## 2021-04-25 LAB — BRAIN NATRIURETIC PEPTIDE: B Natriuretic Peptide: 83 pg/mL (ref 0.0–100.0)

## 2021-04-25 MED ORDER — IOHEXOL 350 MG/ML SOLN
75.0000 mL | Freq: Once | INTRAVENOUS | Status: AC | PRN
  Administered 2021-04-25: 75 mL via INTRAVENOUS

## 2021-04-25 MED ORDER — IPRATROPIUM-ALBUTEROL 0.5-2.5 (3) MG/3ML IN SOLN
3.0000 mL | Freq: Once | RESPIRATORY_TRACT | Status: AC
Start: 2021-04-25 — End: 2021-04-25
  Administered 2021-04-25: 3 mL via RESPIRATORY_TRACT

## 2021-04-25 MED ORDER — PREDNISONE 50 MG PO TABS
60.0000 mg | ORAL_TABLET | Freq: Once | ORAL | Status: AC
  Administered 2021-04-25: 60 mg via ORAL
  Filled 2021-04-25: qty 1

## 2021-04-25 MED ORDER — PREDNISONE 10 MG PO TABS: 40.0000 mg | ORAL_TABLET | Freq: Every day | ORAL | 0 refills | Status: DC

## 2021-04-25 NOTE — ED Provider Notes (Signed)
[facility] EMERGENCY DEPARTMENT Provider Note  CSN: [id number] Arrival date & time: 04/25/21 1016  Chief Complaint(s) Shortness of Breath  HPI Adam Wheeler is a 66 y.o. male with PMH COPD, previous DVT, seizures, CVA who presents emergency department for evaluation of shortness of breath.  Patient states that he was diagnosed with the flu 2 weeks ago and he feels that his shortness of breath has been getting progressively worse.  He denies associated chest pain, abdominal pain, nausea, vomiting, diaphoresis, headache or fever.  He states he has been compliant with his outpatient COPD inhaler treatments.   Shortness of Breath Associated symptoms: cough    Past Medical History Past Medical History:  Diagnosis Date   Ankle fracture, right 06/2012   Anxiety    Arthritis    Colon polyps    adenomatous polyps on multiple colonoscopies, starting in his early 65s   COPD (chronic obstructive pulmonary disease) ([place])    Depression    History of DVT (deep vein thrombosis)    Hypertension    Kidney stones    Seizures ([place])    has seizures weekly   Sleep apnea    Stroke [facility]) 2005   post knee surgery in 2005   Patient Active Problem List   Diagnosis Date Noted   Benign prostatic hyperplasia with lower urinary tract symptoms 12/27/2020   Chronic pain 12/27/2020   Dizziness 12/27/2020   ED (erectile dysfunction) of organic origin 12/27/2020   Epilepsy ([place]) 12/27/2020   History of cerebrovascular accident 12/27/2020   Hyperlipidemia 12/27/2020   Major depression single episode, in partial remission ([place]) 12/27/2020   Pneumonia 12/27/2020   Shoulder joint pain 12/27/2020   Dysphagia 03/29/2020   Cigarette smoker 10/01/2019   Chronic GERD 10/01/2019   Anxiety 08/06/2019   COPD   GOLD III/ still smoking  08/06/2019   Migraines 08/06/2019   Headache(784.0) 03/01/2013   SNHL (sensorineural hearing loss) 10/13/2012   Family history of colon cancer 07/07/2012   Melena 07/07/2012    Nausea with vomiting 07/07/2012   Loss of weight 07/07/2012   Rectal bleeding 07/07/2012   DEPRESSION 05/25/2009   Essential hypertension 05/25/2009   CVA 05/25/2009   PSORIASIS 05/25/2009   SEIZURE DISORDER 05/25/2009   DIARRHEA 05/25/2009   COLONIC POLYPS, ADENOMATOUS, HX OF 05/25/2009   Home Medication(s) Prior to Admission medications   Medication Sig Start Date End Date Taking? Authorizing Provider  albuterol (VENTOLIN HFA) 108 (90 Base) MCG/ACT inhaler Inhale 2 puffs into the lungs every 6 (six) hours as needed for wheezing or shortness of breath. 01/04/20  Yes Tanda Rockers, MD  ALPRAZolam Duanne Moron) 1 MG tablet Take 1 mg by mouth 2 (two) times daily as needed for anxiety. 04/12/21  Yes [provider]  clonazePAM (KLONOPIN) 1 MG tablet Take 1 mg by mouth 2 (two) times daily as needed for anxiety. 10/07/20  Yes [provider]  diclofenac (VOLTAREN) 75 MG EC tablet Take 1 tablet (75 mg total) by mouth 2 (two) times daily with a meal. 03/22/20  Yes Sanjuana Kava, MD  dicyclomine (BENTYL) 10 MG capsule Take 1 capsule (10 mg total) by mouth 4 (four) times daily -  before meals and at bedtime. As needed for looser stool. Monitor for constipation, dry mouth, dizziness. Patient taking differently: Take 10 mg by mouth 3 (three) times daily as needed for spasms. As needed for looser stool. Monitor for constipation, dry mouth, dizziness. 03/29/20  Yes Annitta Needs, NP  famotidine (PEPCID) 20  MG tablet One after supper Patient taking differently: Take 20 mg by mouth every evening. One after supper 10/01/19  Yes Tanda Rockers, MD  FLUoxetine (PROZAC) 40 MG capsule Take 40 mg by mouth every morning.   Yes [provider]  Fluticasone-Umeclidin-Vilant (TRELEGY ELLIPTA) 100-62.5-25 MCG/ACT AEPB INHALE 1 PUFFS BY MOUTH ONCE DAILY Patient taking differently: Inhale 1 puff into the lungs daily. 03/06/21  Yes Tanda Rockers, MD  losartan (COZAAR) 50 MG tablet Take 1 tablet  (50 mg total) by mouth daily. 03/06/21  Yes Tanda Rockers, MD  NEXIUM 40 MG capsule Take 40 mg by mouth daily as needed (heart burn). 06/10/12  Yes [provider]  predniSONE (DELTASONE) 10 MG tablet Take 4 tablets (40 mg total) by mouth daily for 4 days. 04/25/21 04/29/21 Yes Maryjean Corpening, MD  oxyCODONE-acetaminophen (PERCOCET/ROXICET) 5-325 MG tablet One tablet every six hours as needed for pain. Patient not taking: Reported on 04/25/2021 03/30/21   Sanjuana Kava, MD  [facility] ELLIPTA 100-62.5-25 MCG/ACT AEPB INHALE 1 PUFFS BY MOUTH ONCE DAILY Patient not taking: Reported on 04/25/2021 04/12/21   Tanda Rockers, MD                                                                                                                                    Past Surgical History Past Surgical History:  Procedure Laterality Date   BALLOON DILATION N/A 04/21/2020   Procedure: BALLOON DILATION;  Surgeon: Eloise Harman, DO;  Location: [facility] ENDO SUITE;  Service: Endoscopy;  Laterality: N/A;   BIOPSY N/A 07/22/2012   Procedure: BIOPSY;  Surgeon: Danie Binder, MD;  Location: [facility] ORS;  Service: Endoscopy;  Laterality: N/A;   BIOPSY  04/21/2020   Procedure: BIOPSY;  Surgeon: Eloise Harman, DO;  Location: [facility] ENDO SUITE;  Service: Endoscopy;;  gastric GEJ   COLONOSCOPY  06/08/2004   Dr. Rehman:Small polyp cold snared from transverse colon/ Small external hemorrhoids   COLONOSCOPY  06/16/09   [facility]:NTIRW internal hemorrhoids/simple adenomas (four polyps removed). Random colon biopsies negative for microscopic colitis. Screening colonoscopy in 5 years with propofol recommended by Dr. Oneida Alar.   COLONOSCOPY WITH PROPOFOL N/A 07/22/2012   Negative colonic and small bowel biopsies. Moderate sized internal hemorrhoids.    COLONOSCOPY WITH PROPOFOL N/A 04/21/2020   Procedure: COLONOSCOPY WITH PROPOFOL;  Surgeon: Eloise Harman, DO;  Location: [facility];  Service: Endoscopy;  Laterality: N/A;  11:00am    ESOPHAGOGASTRODUODENOSCOPY  06/16/09   SLF: distal peptic stricture, mild erythema, esophagus dilated to 16 mm. Patchy erythema in the antrum with occasional erosion with active oozing. Patchy erythema in the duodenal bulb. Small bowel biopsies benign. Minimal chronic gastritis with no H. pylori.   ESOPHAGOGASTRODUODENOSCOPY (EGD) WITH PROPOFOL N/A 07/22/2012   gastritis. EGD with stricture at GE junction, moderate non-erosive gastritis, mild duodenitis, negative H.pylori    ESOPHAGOGASTRODUODENOSCOPY (EGD) WITH PROPOFOL N/A 04/21/2020  Procedure: ESOPHAGOGASTRODUODENOSCOPY (EGD) WITH PROPOFOL;  Surgeon: Eloise Harman, DO;  Location: [facility];  Service: Endoscopy;  Laterality: N/A;   left knee surgery  2005   POLYPECTOMY  04/21/2020   Procedure: POLYPECTOMY;  Surgeon: Eloise Harman, DO;  Location: [facility] END[facility];  Service: Endoscopy;;   SAVORY DILATION N/A 07/22/2012   Procedure: SAVORY DILATION;  Surgeon: Danie Binder, MD;  Location: [facility] ORS;  Service: Endoscopy;  Laterality: N/A;  81, 15, 89   Family History Family History  Problem Relation Age of Onset   Colon cancer Mother        diagnosed in her 40s, deceased at 62   Breast cancer Mother    Cancer Father        deceased age 30, metastatic cancer ?stomach   Colon cancer Other        2 maternal uncles and one first cousin    Social History Social History   Tobacco Use   Smoking status: Former    Packs/day: 0.25    Years: 40.00    Pack years: 10.00    Types: Cigarettes    Quit date: 04/09/2021    Years since quitting: 0.0   Smokeless tobacco: Never   Tobacco comments:    1-2 cigarettes a day, some days none  Vaping Use   Vaping Use: Never used  Substance Use Topics   Alcohol use: No   Drug use: No   Allergies Lortab [hydrocodone-acetaminophen]  Review of Systems Review of Systems  Respiratory:  Positive for cough and shortness of breath.    Physical Exam Vital Signs  I have reviewed the triage vital  signs BP (!) 179/107    Pulse 77    Temp 97.6 F (36.4 C) (Oral)    Resp 18    Ht 5\' 10"  (1.778 m)    Wt 75.8 kg    SpO2 93%    BMI 23.96 kg/m   Physical Exam Vitals and nursing note reviewed.  Constitutional:      General: He is not in acute distress.    Appearance: He is well-developed.  HENT:     Head: Normocephalic and atraumatic.  Eyes:     Conjunctiva/sclera: Conjunctivae normal.  Cardiovascular:     Rate and Rhythm: Normal rate and regular rhythm.     Heart sounds: No murmur heard. Pulmonary:     Effort: Pulmonary effort is normal. No respiratory distress.     Breath sounds: Wheezing present.  Abdominal:     Palpations: Abdomen is soft.     Tenderness: There is no abdominal tenderness.  Musculoskeletal:        General: No swelling.     Cervical back: Neck supple.  Skin:    General: Skin is warm and dry.     Capillary Refill: Capillary refill takes less than 2 seconds.  Neurological:     Mental Status: He is alert.  Psychiatric:        Mood and Affect: Mood normal.    ED Results and Treatments Labs (all labs ordered are listed, but only abnormal results are displayed) Labs Reviewed  COMPREHENSIVE METABOLIC PANEL - Abnormal; Notable for the following components:      Result Value   Potassium 3.2 (*)    Glucose, Bld 104 (*)    Calcium 8.8 (*)    Total Bilirubin 0.2 (*)    All other components within normal limits  CBC WITH DIFFERENTIAL/PLATELET - Abnormal; Notable for the following components:  WBC 13.8 (*)    RBC 4.08 (*)    Platelets 439 (*)    Neutro Abs 11.7 (*)    Monocytes Absolute 1.1 (*)    All other components within normal limits  BLOOD GAS, VENOUS - Abnormal; Notable for the following components:   pCO2, Ven 41.2 (*)    All other components within normal limits  BRAIN NATRIURETIC PEPTIDE  TROPONIN I (HIGH SENSITIVITY)  TROPONIN I (HIGH SENSITIVITY)                                                                                                                           Radiology DG Chest 2 View  Result Date: 04/25/2021 CLINICAL DATA:  SOB EXAM: CHEST - 2 VIEW COMPARISON:  01/06/2014. FINDINGS: 10 mm right basilar nodular opacity. Scattered interstitial opacities throughout the lungs, most conspicuous at the left lung base. Hyperinflation. No definite pleural effusions or pneumothorax. Cardiomediastinal silhouette is within normal limits. IMPRESSION: 1. 10 mm right basilar nodular opacity. Recommend CT of the chest to further evaluate. 2. Scattered interstitial opacities throughout the lungs, most conspicuous at the left lung base. Findings are likely at least in part secondary to chronic scarring, although superimposed infection is not excluded. 3. Hyperinflation. Electronically Signed   By: Margaretha Sheffield M.D.   On: 04/25/2021 11:32   CT Angio Chest PE W and/or Wo Contrast  Result Date: 04/25/2021 CLINICAL DATA:  Shortness of breath progressively worsening since getting the flu 2 weeks ago, history COPD, high clinical suspicion of pulmonary embolism EXAM: CT ANGIOGRAPHY CHEST WITH CONTRAST TECHNIQUE: Multidetector CT imaging of the chest was performed using the standard protocol during bolus administration of intravenous contrast. Multiplanar CT image reconstructions and MIPs were obtained to evaluate the vascular anatomy. CONTRAST:  62mL OMNIPAQUE IOHEXOL 350 MG/ML SOLN IV COMPARISON:  CT chest 01/06/2014 FINDINGS: Cardiovascular: Atherosclerotic calcifications aorta, proximal great vessels, and coronary arteries. Aorta normal caliber without aneurysm or dissection. Heart size normal. No pericardial effusion. Pulmonary arteries adequately opacified and patent. No evidence of pulmonary embolism. Mediastinum/Nodes: Base of cervical region normal appearance. Esophagus unremarkable. Enlarged RIGHT hilar lymph nodes, 13 mm image 70 and 12 mm image 57, unchanged. Additional scattered normal sized mediastinal and axillary nodes. Lungs/Pleura:  Emphysematous changes and central peribronchial thickening consistent with COPD. Chronic interstitial disease RIGHT lung. Progressive interstitial infiltrates in the lower lobes bilaterally. Questionable nodular density versus interstitial disease 6 mm image 139 and 5 mm image 140. 4 mm RIGHT lower lobe density image 112. No definite acute infiltrate, pleural effusion, or pneumothorax. Upper Abdomen: Visualized upper abdomen unremarkable Musculoskeletal: No acute osseous findings. Review of the MIP images confirms the above findings. IMPRESSION: No evidence of pulmonary embolism. COPD changes with progressive chronic interstitial lung disease changes/fibrosis peripherally, greatest at lung bases. Three nodular foci are identified in the lower lobes largest 6 mm diameter; Non-contrast chest CT at 3-6 months is recommended. If the nodules are stable at time of repeat CT, then  future CT at 18-24 months (from today's scan) is considered optional for low-risk patients, but is recommended for high-risk patients. This recommendation follows the consensus statement: Guidelines for Management of Incidental Pulmonary Nodules Detected on CT Images: From the Fleischner Society 2017; Radiology 2017; 284:228-243. Stable mildly enlarged RIGHT hilar lymph nodes. Aortic Atherosclerosis (ICD10-I70.0) and Emphysema (ICD10-J43.9). Electronically Signed   By: Lavonia Dana M.D.   On: 04/25/2021 12:27    Pertinent labs & imaging results that were available during my care of the patient were reviewed by me and considered in my medical decision making (see MDM for details).  Medications Ordered in ED Medications  iohexol (OMNIPAQUE) 350 MG/ML injection 75 mL (75 mLs Intravenous Contrast Given 04/25/21 1208)  ipratropium-albuterol (DUONEB) 0.5-2.5 (3) MG/3ML nebulizer solution 3 mL (3 mLs Nebulization Given 04/25/21 1418)  predniSONE (DELTASONE) tablet 60 mg (60 mg Oral Given 04/25/21 1404)                                                                                                                                      Procedures Procedures  (including critical care time)  Medical Decision Making / ED Course   This patient presents to the ED for concern of shortness of breath, this involves an extensive number of treatment options, and is a complaint that carries with it a high risk of complications and morbidity.  The differential diagnosis includes COPD exacerbation, pulmonary embolism, ACS, influenza, pneumonia  MDM: Patient seen emergency department for evaluation of shortness of breath.  Physical exam reveals bilateral and expiratory wheezing but is otherwise unremarkable.  Laboratory evaluation with a leukocytosis to 13.8, thrombocytosis to 439, pH is normal at 7.39, mild hypokalemia to 3.2, normal high-sensitivity troponin, ECG nonischemic.  Initial chest x-ray with a 10 mm right basilar nodular opacity and scattered interstitial opacities throughout the lungs most conspicuous at the left lung base.  This was further evaluated with CT angio PE study that was reassuringly negative for PE, does show evidence of progressive chronic interstitial lung disease/fibrosis as well as 3 nodular foci in the lower lobes.  I had a discussion with the patient directly about these nodules and his need to follow-up with his primary care physician for continued screening.  He was given a DuoNeb and prednisone and his wheezing improved.  Patient then discharged with a 4-day course of prednisone and outpatient pulmonology follow-up.   Additional history obtained: -Additional history obtained from wife -External records from outside source obtained and reviewed including: Chart review including previous notes, labs, imaging, consultation notes   Lab Tests: -I ordered, reviewed, and interpreted labs.  The pertinent results include: Leukocytosis to 13.8, normal VBG, hypokalemia to 3.2   EKG  EKG Interpretation  Date/Time:  Tuesday  April 25 2021 10:37:01 EST Ventricular Rate:  76 PR Interval:  59 QRS Duration: 135 QT Interval:  440 QTC Calculation: 495 R Axis:   102  Text Interpretation: Sinus rhythm Short PR interval RBBB and LPFB Confirmed by Tobby Fawcett ([id number]) on 04/25/2021 3:48:01 PM         Imaging Studies ordered: I ordered imaging studies including chest x-ray, CT PE I independently visualized and interpreted imaging. I agree with the radiologist interpretation   Medicines ordered and prescription drug management: Meds ordered this encounter  Medications   iohexol (OMNIPAQUE) 350 MG/ML injection 75 mL   ipratropium-albuterol (DUONEB) 0.5-2.5 (3) MG/3ML nebulizer solution 3 mL   predniSONE (DELTASONE) tablet 60 mg   predniSONE (DELTASONE) 10 MG tablet    Sig: Take 4 tablets (40 mg total) by mouth daily for 4 days.    Dispense:  15 tablet    Refill:  0    -Reevaluation of the patient after these medicines showed that the patient improved -I have reviewed the patients home medicines and have made adjustments as needed   Cardiac Monitoring: The patient was maintained on a cardiac monitor.  I personally viewed and interpreted the cardiac monitored which showed an underlying rhythm of: Normal sinus rhythm with right bundle branch block   Reevaluation: After the interventions noted above, I reevaluated the patient and found that they have :improved  Co morbidities that complicate the patient evaluation  Past Medical History:  Diagnosis Date   Ankle fracture, right 06/2012   Anxiety    Arthritis    Colon polyps    adenomatous polyps on multiple colonoscopies, starting in his early 15s   COPD (chronic obstructive pulmonary disease) ([place])    Depression    History of DVT (deep vein thrombosis)    Hypertension    Kidney stones    Seizures ([place])    has seizures weekly   Sleep apnea    Stroke [facility]) 2005   post knee surgery in 2005      Dispostion: dc     Final Clinical  Impression(s) / ED Diagnoses Final diagnoses:  COPD exacerbation ([place])     @PCDICTATION @    Teressa Lower, MD 04/25/21 1550

## 2021-04-25 NOTE — ED Notes (Addendum)
Patient transported to CT 

## 2021-04-25 NOTE — ED Triage Notes (Signed)
Pt arrived via POV with complaints of SOB from [place] center. Pt states he was dx with flu 2 weeks ago and it has gotten progressively worse. Hx of COPD.

## 2021-04-27 ENCOUNTER — Encounter: Payer: Self-pay | Admitting: Internal Medicine

## 2021-04-27 ENCOUNTER — Telehealth: Payer: Self-pay | Admitting: Orthopaedic Surgery

## 2021-04-27 ENCOUNTER — Ambulatory Visit (INDEPENDENT_AMBULATORY_CARE_PROVIDER_SITE_OTHER): Payer: Medicare (Managed Care) | Admitting: Internal Medicine

## 2021-04-27 ENCOUNTER — Other Ambulatory Visit: Payer: Self-pay

## 2021-04-27 DIAGNOSIS — F17210 Nicotine dependence, cigarettes, uncomplicated: Secondary | ICD-10-CM

## 2021-04-27 DIAGNOSIS — J449 Chronic obstructive pulmonary disease, unspecified: Secondary | ICD-10-CM

## 2021-04-27 DIAGNOSIS — R918 Other nonspecific abnormal finding of lung field: Secondary | ICD-10-CM | POA: Diagnosis not present

## 2021-04-27 DIAGNOSIS — I10 Essential (primary) hypertension: Secondary | ICD-10-CM | POA: Diagnosis not present

## 2021-04-27 MED ORDER — PREDNISONE 10 MG PO TABS: ORAL_TABLET | ORAL | 0 refills | Status: DC

## 2021-04-27 MED ORDER — STIOLTO RESPIMAT 2.5-2.5 MCG/ACT IN AERS: 2.0000 | INHALATION_SPRAY | Freq: Every day | RESPIRATORY_TRACT | 0 refills | Status: DC

## 2021-04-27 MED ORDER — ALBUTEROL SULFATE (2.5 MG/3ML) 0.083% IN NEBU: 2.5000 mg | INHALATION_SOLUTION | RESPIRATORY_TRACT | Status: DC | PRN

## 2021-04-27 MED ORDER — STIOLTO RESPIMAT 2.5-2.5 MCG/ACT IN AERS: 2.0000 | INHALATION_SPRAY | Freq: Every day | RESPIRATORY_TRACT | 11 refills | Status: DC

## 2021-04-27 MED ORDER — OXYCODONE-ACETAMINOPHEN 5-325 MG PO TABS: ORAL_TABLET | ORAL | 0 refills | Status: DC

## 2021-04-27 NOTE — Telephone Encounter (Signed)
Done

## 2021-04-27 NOTE — Assessment & Plan Note (Signed)
D/c acei 03/06/2021 due to pseudowheeze > better 04/27/2021    Although even in retrospect it may not be clear the ACEi contributed to the pt's symptoms,  Pt improved off them and adding them back at this point or in the future would risk confusion in interpretation of non-specific respiratory symptoms to which this patient is prone  ie  Better not to muddy the waters here.           Each maintenance medication was reviewed in detail including emphasizing most importantly the difference between maintenance and prns and under what circumstances the prns are to be triggered using an action plan format where appropriate.  Total time for H and P, chart review, counseling, reviewing hfa/smi device(s) and generating customized AVS unique to this office visit / same day charting  > 30 min

## 2021-04-27 NOTE — Patient Instructions (Addendum)
Plan A = Automatic = Always=    Stiolto 2 puffs 1st thing in am  Work on inhaler technique:  relax and gently blow all the way out then take a nice smooth full deep breath back in, triggering the inhaler at same time you start breathing in.  Hold for up to 5 seconds if you can.  Rinse and gargle with water when done.  If mouth or throat bother you at all,  try brushing teeth/gums/tongue with arm and hammer toothpaste/ make a slurry and gargle and spit out.      Plan B = Backup (to supplement plan A, not to replace it) Only use your albuterol inhaler as a rescue medication to be used if you can't catch your breath by resting or doing a relaxed purse lip breathing pattern.  - The less you use it, the better it will work when you need it. - Ok to use the inhaler up to 2 puffs  every 4 hours if you must but call for appointment if use goes up over your usual need - Don't leave home without it !!  (think of it like the spare tire for your car)   Plan C = Crisis (instead of Plan B but only if Plan B stops working) - only use your albuterol nebulizer if you first try Plan B and it fails to help > ok to use the nebulizer up to every 4 hours but if start needing it regularly call for immediate appointment  Prednisone 10 mg take  4 each am x 2 days,   2 each am x 2 days,  1 each am x 2 days and stop   For cough > mucinex dm 1200 mg every 12 hours as needed   Continue nexium Take 30-60 min before first meal of the day and while cough/ wheeze add pepcid 20 mg after suppers   Keep prior appt  - call sooner if needed

## 2021-04-27 NOTE — Assessment & Plan Note (Addendum)
See CTa 1/10/2 x 3 noudles largest 6 mm lower lobes> rec f/u CT at 6 m  CT results reviewed with pt >>> Too small for PET or bx, not suspicious enough for excisional bx > really only option for now is follow the Fleischner society guidelines as rec by radiology as above  Discussed in detail all the  indications, usual  risks and alternatives  relative to the benefits with patient who agrees to proceed with w/u as outlined.

## 2021-04-27 NOTE — Telephone Encounter (Signed)
Patient called for refill: oxyCODONE-acetaminophen (PERCOCET/ROXICET) 5-325 MG tablet 28 tablet       [employer]

## 2021-04-27 NOTE — Assessment & Plan Note (Signed)
Counseled re importance of smoking cessation but did not meet time criteria for separate billing   °

## 2021-04-27 NOTE — Assessment & Plan Note (Addendum)
Quit smoking 03/2021  - PFT's by Oneal around 2017 /18 reported "stage 3" - 10/01/2019  After extensive coaching inhaler device,  effectiveness =    90% with elipta >> continue trelegy   - PFT's  11/24/19   FEV1 1.51 (42 % ) ratio 0.35  p 1 % improvement from saba p "inhaler" 6 h prior to study with DLCO  12.75 (46%) corrects to 2.11 (50%)  for alv volume and FV curve classic concave exp flow vol  - alpha one AT screen   01/04/2020 >>> not done  - 03/06/2021  After extensive coaching inhaler device,  effectiveness =    75% continue trelegy and try off acei due to pseudowheeze on exam - 04/27/2021  After extensive coaching inhaler device,  effectiveness =    75% with smi/respimat so try stioilto 2 each am as breaking thru trelegy with lots of upper airway sympoms   DDX of  difficult airways management almost all start with A and  include Adherence, Ace Inhibitors, Acid Reflux, Active Sinus Disease, Alpha 1 Antitripsin deficiency, Anxiety masquerading as Airways dz,  ABPA,  Allergy(esp in young), Aspiration (esp in elderly), Adverse effects of meds,  Active smoking or vaping, A bunch of PE's (a small clot burden can't cause this syndrome unless there is already severe underlying pulm or vascular dz with poor reserve) plus two Bs  = Bronchiectasis and Beta blocker use..and one C= CHF   Adherence is always the initial "prime suspect" and is a multilayered concern that requires a "trust but verify" approach in every patient - starting with knowing how to use medications, especially inhalers, correctly, keeping up with refills and understanding the fundamental difference between maintenance and prns vs those medications only taken for a very short course and then stopped and not refilled.  - see inhaler teaching - return with all meds in hand using a trust but verify approach to confirm accurate Medication  Reconciliation The principal here is that until we are certain that the  patients are doing what we've  asked, it makes no sense to ask them to do more.   Active smoking > now denies/ re-inforced   Adverse drug effects > d/c dpi for now  ? Allergy/asthma component > Prednisone 10 mg take  4 each am x 2 days,   2 each am x 2 days,  1 each am x 2 days and stop but hold off further steroids for now as may aggravate the upper airway component  ? Acid (or non-acid) GERD > always difficult to exclude as up to 75% of pts in some series report no assoc GI/ Heartburn symptoms> rec max (24h)  acid suppression and diet restrictions/ reviewed and instructions given in writing.

## 2021-04-27 NOTE — Progress Notes (Signed)
Adam Wheeler, male    DOB: Aug 21, 1955    MRN: [id number]   Brief patient profile:  66  yowm  quit smoking 04/09/21 with new onset doe x 2017 eval by [name] and [name] dx copd and rx trelegy self referred to the pulmonary clinic in [place] 10/01/2019    History of Present Illness  10/01/2019  Pulmonary/ 1st office eval/Yittel Emrich  Chief Complaint  Patient presents with   Pulmonary Consult    Former pt of Dr [name]- COPD. He states today his breathing is "average"- needing samples of trelegy. He states this helps his SOB more than anything.   Dyspnea:  MMRC3 = can't walk 100 yards even at a slow pace at a flat grade s stopping due to sob  Difficulty with [employer] even going slowly and not checking sats/ worse since ran out of trelegy  Cough: none  Sleep: ok 30-45 degrees due to gerd not cough or sob  SABA use: once or twice a week rec Nexium 40 mg   Take  30-60 min before first meal of the day and Pepcid (famotidine)  20 mg one after supper or before bedtime  until return to office - this is the best way to tell whether stomach acid is contributing to your problem.   GERD  No change in trelegy for now - be sure to brush teeth and gargle with arm and hammer toothpaste Only use your albuterol as a rescue medication    01/04/2020  f/u ov/Akiko Schexnider re: COPD III/ still smoking maint on trelegy/ vaccinated for covid 66 Chief Complaint  Patient presents with   Follow-up    patient is feeling better since last visit, no concerns at this time.   Dyspnea:  Easier to get across parking lot and thru the store at [facility]  Cough: am only, x 15 min then resolves  Sleeping: ok on 3 in incline  SABA use: once a week maybe  02: none  Rec Keep working on no smoking at all  No change  in medications Only use your albuterol as a rescue medication      03/06/2021  f/u ov/[place] office/Ciarra Braddy re: GOLD 3 still smoking/ maint on trelegy 100 each am  now on lisinopril  Chief Complaint  Patient presents  with   Follow-up    Breathing and productive cough with white mucus have not improved since last ov.  Fllu shot today?   Dyspnea:  slt worse sits down when gets to store Cough: min white mucus/globus sensation Sleeping: noct choking new since started acei  SABA use: none  02: none  Covid status: vax x 4  Lung cancer screening: rec  Rec I recommend you discuss annual CT scanning for lung cancer  Stop lisinopril and take losartan 50 mg daily and have your PCP adjust it Please remember to go to the  x-ray department  for your tests - we will call you with the results when they are available   The key is to stop smoking completely before smoking completely stops you! Please schedule a follow up visit in 3 months but call sooner if needed    04/27/2021  acute  ov/[place] office/Catarina Huntley re: copd  GOLD 3/ just quit smoking/ s/p AECOPD while on trelegy and alb now up to 8 x day   Chief Complaint  Patient presents with   Follow-up    ED visit on 04/25/2021 for copd exacerbation was advised by ED to follow up with pulmonary. Pneumonia. Feels like  he is having a hard time breathing.   Stopped smoking approx. 2 weeks ago.   Dyspnea:  sob across the room  Cough: white now Sleeping: on side/ bed 4in  bed blocks  SABA use: 4 x days / hfa  02: none  Covid status: x 4      No obvious day to day or daytime variability or assoc purulent sputum or mucus plugs or hemoptysis or cp or chest tightness, subjective wheeze or overt sinus or hb symptoms.   Also denies any obvious fluctuation of symptoms with weather or environmental changes or other aggravating or alleviating factors except as outlined above   No unusual exposure hx or h/o childhood pna/ asthma or knowledge of premature birth.  Current Allergies, Complete Past Medical History, Past Surgical History, Family History, and Social History were reviewed in [employer] record.  ROS  The following are not active complaints  unless bolded Hoarseness, sore throat, dysphagia(globus) , dental problems, itching, sneezing,  nasal congestion or discharge of excess mucus or purulent secretions, ear ache,   fever, chills, sweats, unintended wt loss or wt gain, classically pleuritic or exertional cp,  orthopnea pnd or arm/hand swelling  or leg swelling, presyncope, palpitations, abdominal pain, anorexia, nausea, vomiting, diarrhea  or change in bowel habits or change in bladder habits, change in stools or change in urine, dysuria, hematuria,  rash, arthralgias, visual complaints, headache, numbness, weakness or ataxia or problems with walking or coordination,  change in mood or  memory.        Current Meds  Medication Sig   albuterol (VENTOLIN HFA) 108 (90 Base) MCG/ACT inhaler Inhale 2 puffs into the lungs every 6 (six) hours as needed for wheezing or shortness of breath.   ALPRAZolam (XANAX) 1 MG tablet Take 1 mg by mouth 2 (two) times daily as needed for anxiety.   clonazePAM (KLONOPIN) 1 MG tablet Take 1 mg by mouth 2 (two) times daily as needed for anxiety.   diclofenac (VOLTAREN) 75 MG EC tablet Take 1 tablet (75 mg total) by mouth 2 (two) times daily with a meal.   dicyclomine (BENTYL) 10 MG capsule Take 1 capsule (10 mg total) by mouth 4 (four) times daily -  before meals and at bedtime. As needed for looser stool. Monitor for constipation, dry mouth, dizziness. (Patient taking differently: Take 10 mg by mouth 3 (three) times daily as needed for spasms. As needed for looser stool. Monitor for constipation, dry mouth, dizziness.)   famotidine (PEPCID) 20 MG tablet One after supper (Patient taking differently: Take 20 mg by mouth every evening. One after supper)   FLUoxetine (PROZAC) 40 MG capsule Take 40 mg by mouth every morning.   Fluticasone-Umeclidin-Vilant (TRELEGY ELLIPTA) 100-62.5-25 MCG/ACT AEPB INHALE 1 PUFFS BY MOUTH ONCE DAILY (Patient taking differently: Inhale 1 puff into the lungs daily.)   losartan (COZAAR) 50  MG tablet Take 1 tablet (50 mg total) by mouth daily.   NEXIUM 40 MG capsule Take 40 mg by mouth daily as needed (heart burn).   oxyCODONE-acetaminophen (PERCOCET/ROXICET) 5-325 MG tablet One tablet every six hours as needed for pain.   predniSONE (DELTASONE) 10 MG tablet Take 4 tablets (40 mg total) by mouth daily for 4 days.   TRELEGY ELLIPTA 100-62.5-25 MCG/ACT AEPB INHALE 1 PUFFS BY MOUTH ONCE DAILY              Past Medical History:  Diagnosis Date   Ankle fracture, right 06/2012   Anxiety  Arthritis    Colon polyps    adenomatous polyps on multiple colonoscopies, starting in his early 41s   COPD (chronic obstructive pulmonary disease) (HCC)    Depression    History of DVT (deep vein thrombosis)    Hypertension    Kidney stones    Seizures ([place])    has seizures weekly   Sleep apnea    Stroke [facility]) 2005   post knee surgery in 2005         Objective:      04/27/2021       164 03/06/2021     166   10/01/19 158 lb (71.7 kg)  06/25/19 165 lb (74.8 kg)  06/03/19 163 lb 2 oz (74 kg)       Vital signs reviewed  04/27/2021  - Note at rest 02 sats  94% on RA   General appearance:    acute and chronically ill elderly wm > stated age     40 : pt wearing mask not removed for exam due to covid - 19 concerns.    NECK :  without JVD/Nodes/TM/ nl carotid upstrokes bilaterally   LUNGS: no acc muscle use,  Mild barrel  contour chest wall with bilateral pan-exp rhonchi   without cough on insp or exp maneuvers  and mild  Hyperresonant  to  percussion bilaterally     CV:  RRR  no s3 or murmur or increase in P2, and no edema   ABD:  soft and nontender with pos end  insp Hoover's  in the supine position. No bruits or organomegaly appreciated, bowel sounds nl  MS:   Nl gait/  ext warm without deformities, calf tenderness, cyanosis or clubbing No obvious joint restrictions   SKIN: warm and dry without lesions    NEURO:  alert, approp, nl sensorium with  no motor or  cerebellar deficits apparent.        I personally reviewed images and agree with radiology impression as follows:  Chest CTa 04/25/21 No evidence of pulmonary embolism.   COPD changes with progressive chronic interstitial lung disease changes/fibrosis peripherally, greatest at lung bases.   Three nodular foci are identified in the lower lobes largest 6 mm diameter; Non-contrast chest CT at 3-6 months is recommended      Assessment

## 2021-05-04 ENCOUNTER — Other Ambulatory Visit: Payer: Self-pay

## 2021-05-04 ENCOUNTER — Ambulatory Visit (INDEPENDENT_AMBULATORY_CARE_PROVIDER_SITE_OTHER): Payer: Medicare (Managed Care) | Admitting: Orthopaedic Surgery

## 2021-05-04 ENCOUNTER — Encounter: Payer: Self-pay | Admitting: Orthopaedic Surgery

## 2021-05-04 DIAGNOSIS — F17210 Nicotine dependence, cigarettes, uncomplicated: Secondary | ICD-10-CM

## 2021-05-04 DIAGNOSIS — G8929 Other chronic pain: Secondary | ICD-10-CM

## 2021-05-04 DIAGNOSIS — M25511 Pain in right shoulder: Secondary | ICD-10-CM | POA: Diagnosis not present

## 2021-05-04 NOTE — Progress Notes (Signed)
PROCEDURE NOTE:  The patient request injection, verbal consent was obtained.  The right shoulder was prepped appropriately after time out was performed.   Sterile technique was observed and injection of 1 cc of DepoMedrol 40mg  with several cc's of plain xylocaine. Anesthesia was provided by ethyl chloride and a 20-gauge needle was used to inject the shoulder area. A posterior approach was used.  The injection was tolerated well.  A band aid dressing was applied.  The patient was advised to apply ice later today and tomorrow to the injection sight as needed.  Encounter Diagnoses  Name Primary?   Chronic right shoulder pain Yes   Nicotine dependence, cigarettes, uncomplicated    Return in two months.  Call if any problem.  Precautions discussed.  Electronically Signed Sanjuana Kava, MD 1/19/20239:30 AM

## 2021-05-29 ENCOUNTER — Telehealth: Payer: Self-pay

## 2021-05-29 MED ORDER — OXYCODONE-ACETAMINOPHEN 5-325 MG PO TABS: ORAL_TABLET | ORAL | 0 refills | Status: DC

## 2021-05-29 NOTE — Telephone Encounter (Signed)
Oxycodone-Acetaminophen 5/325 mg  Qty 28 Tablets  PATIENT USES [facility]

## 2021-06-02 ENCOUNTER — Ambulatory Visit: Payer: 59 | Admitting: Internal Medicine

## 2021-06-05 ENCOUNTER — Ambulatory Visit: Payer: Medicare HMO | Admitting: Internal Medicine

## 2021-06-05 NOTE — Progress Notes (Deleted)
Adam Wheeler, male    DOB: 04-Aug-1955    MRN: [id number]   Brief patient profile:  68  yowm  quit smoking 04/09/21 with new onset doe x 2017 eval by Adam Wheeler and Adam Wheeler dx copd and rx trelegy self referred to the pulmonary clinic in [place] 10/01/2019    History of Present Illness  10/01/2019  Pulmonary/ 1st office eval/Adam Wheeler  Chief Complaint  Patient presents with   Pulmonary Consult    Former pt of Adam Wheeler- COPD. He states today his breathing is "average"- needing samples of trelegy. He states this helps his SOB more than anything.   Dyspnea:  MMRC3 = can't walk 100 yards even at a slow pace at a flat grade s stopping due to sob  Difficulty with [employer] even going slowly and not checking sats/ worse since ran out of trelegy  Cough: none  Sleep: ok 30-45 degrees due to gerd not cough or sob  SABA use: once or twice a week rec Nexium 40 mg   Take  30-60 min before first meal of the day and Pepcid (famotidine)  20 mg one after supper or before bedtime  until return to office - this is the best way to tell whether stomach acid is contributing to your problem.   GERD  No change in trelegy for now - be sure to brush teeth and gargle with arm and hammer toothpaste Only use your albuterol as a rescue medication    01/04/2020  f/u ov/Adam Wheeler re: COPD III/ still smoking maint on trelegy/ vaccinated for covid 9 Chief Complaint  Patient presents with   Follow-up    patient is feeling better since last visit, no concerns at this time.   Dyspnea:  Easier to get across parking lot and thru the store at [employer]  Cough: am only, x 15 min then resolves  Sleeping: ok on 3 in incline  SABA use: once a week maybe  02: none  Rec Keep working on no smoking at all  No change  in medications Only use your albuterol as a rescue medication      03/06/2021  f/u ov/Adam Wheeler office/Adam Wheeler re: GOLD 3 still smoking/ maint on trelegy 100 each am  now on lisinopril  Chief Complaint  Patient presents  with   Follow-up    Breathing and productive cough with white mucus have not improved since last ov.  Fllu shot today?   Dyspnea:  slt worse sits down when gets to store Cough: min white mucus/globus sensation Sleeping: noct choking new since started acei  SABA use: none  02: none  Covid status: vax x 4  Lung cancer screening: rec  Rec I recommend you discuss annual CT scanning for lung cancer  Stop lisinopril and take losartan 50 mg daily and have your PCP adjust it Please remember to go to the  x-ray department  for your tests - we will call you with the results when they are available   The key is to stop smoking completely before smoking completely stops you! Please schedule a follow up visit in 3 months but call sooner if needed    04/27/2021  acute  ov/[name] office/Adam Wheeler re: copd  GOLD 3/ just quit smoking/ s/p AECOPD while on trelegy and alb now up to 8 x day   Chief Complaint  Patient presents with   Follow-up    ED visit on 04/25/2021 for copd exacerbation was advised by ED to follow up with pulmonary. Pneumonia. Feels like  he is having a hard time breathing.   Stopped smoking approx. 2 weeks ago.   Dyspnea:  sob across the room  Cough: white now Sleeping: on side/ bed 4in  bed blocks  SABA use: 4 x days / hfa  02: none  Covid status: x 4  Rec Plan A = Automatic = Always=    Stiolto 2 puffs 1st thing in am Work on inhaler technique:   Plan B = Backup (to supplement plan A, not to replace it) Only use your albuterol inhaler as a rescue medication  Plan C = Crisis (instead of Plan B but only if Plan B stops working) - only use your albuterol nebulizer if you first try Plan B and it fails to help  Prednisone 10 mg take  4 each am x 2 days,   2 each am x 2 days,  1 each am x 2 days and stop  For cough > mucinex dm 1200 mg every 12 hours as needed  Continue nexium Take 30-60 min before first meal of the day and while cough/ wheeze add pepcid 20 mg after suppers  Keep  prior appt  - call sooner if needed    06/05/2021  f/u ov/[name] office/[name] re: *** maint on ***  No chief complaint on file.   Dyspnea:  *** Cough: *** Sleeping: *** SABA use: *** 02: *** Covid status: *** Lung cancer screening: ***   No obvious day to day or daytime variability or assoc excess/ purulent sputum or mucus plugs or hemoptysis or cp or chest tightness, subjective wheeze or overt sinus or hb symptoms.   *** without nocturnal  or early am exacerbation  of respiratory  c/o's or need for noct saba. Also denies any obvious fluctuation of symptoms with weather or environmental changes or other aggravating or alleviating factors except as outlined above   No unusual exposure hx or h/o childhood pna/ asthma or knowledge of premature birth.  Current Allergies, Complete Past Medical History, Past Surgical History, Family History, and Social History were reviewed in [employer] record.  ROS  The following are not active complaints unless bolded Hoarseness, sore throat, dysphagia, dental problems, itching, sneezing,  nasal congestion or discharge of excess mucus or purulent secretions, ear ache,   fever, chills, sweats, unintended wt loss or wt gain, classically pleuritic or exertional cp,  orthopnea pnd or arm/hand swelling  or leg swelling, presyncope, palpitations, abdominal pain, anorexia, nausea, vomiting, diarrhea  or change in bowel habits or change in bladder habits, change in stools or change in urine, dysuria, hematuria,  rash, arthralgias, visual complaints, headache, numbness, weakness or ataxia or problems with walking or coordination,  change in mood or  memory.        No outpatient medications have been marked as taking for the 06/05/21 encounter (Appointment) with Adam Rockers, MD.                  Past Medical History:  Diagnosis Date   Ankle fracture, right 06/2012   Anxiety    Arthritis    Colon polyps    adenomatous polyps  on multiple colonoscopies, starting in his early 78s   COPD (chronic obstructive pulmonary disease) ([place])    Depression    History of DVT (deep vein thrombosis)    Hypertension    Kidney stones    Seizures ([place])    has seizures weekly   Sleep apnea    Stroke [facility]) 2005  post knee surgery in 2005         Objective:      06/05/2021        ***  04/27/2021       164 03/06/2021     166   10/01/19 158 lb (71.7 kg)  06/25/19 165 lb (74.8 kg)  06/03/19 163 lb 2 oz (74 kg)    Vital signs reviewed  06/05/2021  - Note at rest 02 sats  ***% on ***   General appearance:    ***      Mild bar***   I personally reviewed images and agree with radiology impression as follows:  Chest CTa 04/25/21 No evidence of pulmonary embolism.   COPD changes with progressive chronic interstitial lung disease changes/fibrosis peripherally, greatest at lung bases.   Three nodular foci are identified in the lower lobes largest 6 mm diameter; Non-contrast chest CT at 3-6 months is recommended      Assessment

## 2021-06-26 ENCOUNTER — Telehealth: Payer: Self-pay | Admitting: Orthopaedic Surgery

## 2021-06-26 MED ORDER — OXYCODONE-ACETAMINOPHEN 5-325 MG PO TABS: ORAL_TABLET | ORAL | 0 refills | Status: DC

## 2021-06-26 NOTE — Telephone Encounter (Signed)
Patient called for refill oxyCODONE-acetaminophen (PERCOCET/ROXICET) 5-325 MG tablet 26 tablet      [facility] 

## 2021-06-27 NOTE — Telephone Encounter (Signed)
Pt is calling in again regarding Medication ?

## 2021-06-27 NOTE — Telephone Encounter (Signed)
Prescription was done and at pharmacy; called back to patient to notify, reached voice mail, left message. ?

## 2021-07-04 ENCOUNTER — Ambulatory Visit (INDEPENDENT_AMBULATORY_CARE_PROVIDER_SITE_OTHER): Payer: Medicare (Managed Care) | Admitting: Orthopaedic Surgery

## 2021-07-04 ENCOUNTER — Other Ambulatory Visit: Payer: Self-pay

## 2021-07-04 ENCOUNTER — Encounter: Payer: Self-pay | Admitting: Orthopaedic Surgery

## 2021-07-04 DIAGNOSIS — G8929 Other chronic pain: Secondary | ICD-10-CM | POA: Diagnosis not present

## 2021-07-04 DIAGNOSIS — M25511 Pain in right shoulder: Secondary | ICD-10-CM

## 2021-07-04 NOTE — Progress Notes (Signed)
PROCEDURE NOTE: ? ?The patient request injection, verbal consent was obtained. ? ?The right shoulder was prepped appropriately after time out was performed.  ? ?Sterile technique was observed and injection of 1 cc of DepoMedrol '40mg'$  with several cc's of plain xylocaine. Anesthesia was provided by ethyl chloride and a 20-gauge needle was used to inject the shoulder area. A posterior approach was used.  The injection was tolerated well. ? ?A band aid dressing was applied. ? ?The patient was advised to apply ice later today and tomorrow to the injection sight as needed. ? ?Encounter Diagnosis  ?Name Primary?  ? Chronic right shoulder pain Yes  ? ?Return in two months. ? ?Call if any problem. ? ?Precautions discussed. ? ?Electronically Signed ?Sanjuana Kava, MD ?3/21/20239:26 AM ? ?

## 2021-07-06 ENCOUNTER — Encounter: Payer: Self-pay | Admitting: Internal Medicine

## 2021-07-06 ENCOUNTER — Ambulatory Visit (INDEPENDENT_AMBULATORY_CARE_PROVIDER_SITE_OTHER): Payer: Medicaid - Out of State | Admitting: Internal Medicine

## 2021-07-06 ENCOUNTER — Other Ambulatory Visit: Payer: Self-pay

## 2021-07-06 DIAGNOSIS — I10 Essential (primary) hypertension: Secondary | ICD-10-CM | POA: Diagnosis not present

## 2021-07-06 DIAGNOSIS — J449 Chronic obstructive pulmonary disease, unspecified: Secondary | ICD-10-CM | POA: Diagnosis not present

## 2021-07-06 DIAGNOSIS — R918 Other nonspecific abnormal finding of lung field: Secondary | ICD-10-CM

## 2021-07-06 MED ORDER — PREDNISONE 10 MG PO TABS: ORAL_TABLET | ORAL | 5 refills | Status: DC

## 2021-07-06 MED ORDER — PREDNISONE 10 MG PO TABS: ORAL_TABLET | ORAL | 0 refills | Status: DC

## 2021-07-06 MED ORDER — ALBUTEROL SULFATE (2.5 MG/3ML) 0.083% IN NEBU: 2.5000 mg | INHALATION_SOLUTION | Freq: Four times a day (QID) | RESPIRATORY_TRACT | 12 refills | Status: DC | PRN

## 2021-07-06 MED ORDER — TRELEGY ELLIPTA 100-62.5-25 MCG/ACT IN AEPB: INHALATION_SPRAY | RESPIRATORY_TRACT | Status: DC

## 2021-07-06 NOTE — Assessment & Plan Note (Signed)
D/c acei 03/06/2021 due to pseudowheeze > better 04/27/2021  ? ?Now tolerating DPI which he didn't while of ACEi and bp good control on ARB > f/u PCP  ? ?Each maintenance medication was reviewed in detail including emphasizing most importantly the difference between maintenance and prns and under what circumstances the prns are to be triggered using an action plan format where appropriate. ? ?Total time for H and P, chart review, counseling, reviewing dpi/hfa/neb device(s) , directly observing portions of ambulatory 02 saturation study/ and generating customized AVS unique to this office visit / same day charting >  45 min  ?     ?  ?      ?

## 2021-07-06 NOTE — Progress Notes (Signed)
Adam Wheeler, male    DOB: 1956/02/04    MRN: [id number]   Brief patient profile:  62  yowm  quit smoking 04/09/21 with new onset doe x 2017 eval by [name] and [name] dx copd and rx trelegy self referred to the pulmonary clinic in [place] 10/01/2019    History of Present Illness  10/01/2019  Pulmonary/ 1st office eval/Erie Sica  Chief Complaint  Patient presents with   Pulmonary Consult    Former pt of Dr [name]- COPD. He states today his breathing is "average"- needing samples of trelegy. He states this helps his SOB more than anything.   Dyspnea:  MMRC3 = can't walk 100 yards even at a slow pace at a flat grade s stopping due to sob  Difficulty with [employer] even going slowly and not checking sats/ worse since ran out of trelegy  Cough: none  Sleep: ok 30-45 degrees due to gerd not cough or sob  SABA use: once or twice a week rec Nexium 40 mg   Take  30-60 min before first meal of the day and Pepcid (famotidine)  20 mg one after supper or before bedtime  until return to office - this is the best way to tell whether stomach acid is contributing to your problem.   GERD  No change in trelegy for now - be sure to brush teeth and gargle with arm and hammer toothpaste Only use your albuterol as a rescue medication         03/06/2021  f/u ov/[name] office/Urania Pearlman re: GOLD 3 still smoking/ maint on trelegy 100 each am  now on lisinopril  Chief Complaint  Patient presents with   Follow-up    Breathing and productive cough with white mucus have not improved since last ov.  Fllu shot today?   Dyspnea:  slt worse sits down when gets to store Cough: min white mucus/globus sensation Sleeping: noct choking new since started acei  SABA use: none  02: none  Covid status: vax x 4  Lung cancer screening: rec  Rec I recommend you discuss annual CT scanning for lung cancer  Stop lisinopril and take losartan 50 mg daily and have your PCP adjust it Please remember to go to the  x-ray  department  for your tests - we will call you with the results when they are available   The key is to stop smoking completely before smoking completely stops you! Please schedule a follow up visit in 3 months but call sooner if needed    04/27/2021  acute  ov/[name] office/Dorleen Kissel re: copd  GOLD 3/ just quit smoking/ s/p AECOPD while on trelegy and alb now up to 8 x day   Chief Complaint  Patient presents with   Follow-up    ED visit on 04/25/2021 for copd exacerbation was advised by ED to follow up with pulmonary. Pneumonia. Feels like he is having a hard time breathing.   Stopped smoking approx. 2 weeks ago.   Dyspnea:  sob across the room  Cough: white now Sleeping: on side/ bed 4in  bed blocks  SABA use: 4 x days / hfa  02: none  Covid status: x 4  Rec Plan A = Automatic = Always=    Stiolto 2 puffs 1st thing in am Work on inhaler technique Plan B = Backup (to supplement plan A, not to replace it) Only use your albuterol inhaler as a rescue medication Plan C = Crisis (instead of Plan B but only if  Plan B stops working) - only use your albuterol nebulizer if you first try Plan B  Prednisone 10 mg take  4 each am x 2 days,   2 each am x 2 days,  1 each am x 2 days and stop  For cough > mucinex dm 1200 mg every 12 hours as needed  Continue nexium Take 30-60 min before first meal of the day and while cough/ wheeze add pepcid 20 mg after suppers  Keep prior appt  - call sooner if needed    07/06/2021  f/u ov/[place] office/Roran Wegner re: GOLD 3  maint on trelegy preferred over stiolto   Chief Complaint  Patient presents with   Follow-up    SOB has not improved.  Wants to discuss getting prednisone and albuterol nebs   Dyspnea:  70 ft across  trailer lost ground  since off prednisone (not reproduced at ov  when walked 450 ft)   Cough: bette Sleeping: 45 degrees blocks under [place]  SABA use: once or twice daily  02: none  Covid status: vax x 4      No obvious day to day  or daytime variability or assoc excess/ purulent sputum or mucus plugs or hemoptysis or cp or chest tightness, subjective wheeze or overt sinus or hb symptoms.   Sleeping better as above  without nocturnal  or early am exacerbation  of respiratory  c/o's or need for noct saba. Also denies any obvious fluctuation of symptoms with weather or environmental changes or other aggravating or alleviating factors except as outlined above   No unusual exposure hx or h/o childhood pna/ asthma or knowledge of premature birth.  Current Allergies, Complete Past Medical History, Past Surgical History, Family History, and Social History were reviewed in [employer] record.  ROS  The following are not active complaints unless bolded Hoarseness, sore throat, dysphagia, dental problems, itching, sneezing,  nasal congestion or discharge of excess mucus or purulent secretions, ear ache,   fever, chills, sweats, unintended wt loss or wt gain, classically pleuritic or exertional cp,  orthopnea pnd or arm/hand swelling  or leg swelling, presyncope, palpitations, abdominal pain, anorexia, nausea, vomiting, diarrhea  or change in bowel habits or change in bladder habits, change in stools or change in urine, dysuria, hematuria,  rash, arthralgias, visual complaints, headache, numbness, weakness or ataxia or problems with walking or coordination,  change in mood or  memory.        Current Meds  Medication Sig   albuterol (PROVENTIL) (2.5 MG/3ML) 0.083% nebulizer solution Take 3 mLs (2.5 mg total) by nebulization every 4 (four) hours as needed for wheezing or shortness of breath.   albuterol (VENTOLIN HFA) 108 (90 Base) MCG/ACT inhaler Inhale 2 puffs into the lungs every 6 (six) hours as needed for wheezing or shortness of breath.   ALPRAZolam (XANAX) 1 MG tablet Take 1 mg by mouth 2 (two) times daily as needed for anxiety.   clonazePAM (KLONOPIN) 1 MG tablet Take 1 mg by mouth 2 (two) times daily as  needed for anxiety.   diclofenac (VOLTAREN) 75 MG EC tablet Take 1 tablet (75 mg total) by mouth 2 (two) times daily with a meal.   dicyclomine (BENTYL) 10 MG capsule Take 1 capsule (10 mg total) by mouth 4 (four) times daily -  before meals and at bedtime. As needed for looser stool. Monitor for constipation, dry mouth, dizziness. (Patient taking differently: Take 10 mg by mouth 3 (three) times daily as  needed for spasms. As needed for looser stool. Monitor for constipation, dry mouth, dizziness.)   famotidine (PEPCID) 20 MG tablet One after supper (Patient taking differently: Take 20 mg by mouth every evening. One after supper)   FLUoxetine (PROZAC) 40 MG capsule Take 40 mg by mouth every morning.   losartan (COZAAR) 50 MG tablet Take 1 tablet (50 mg total) by mouth daily.   NEXIUM 40 MG capsule Take 40 mg by mouth daily as needed (heart burn).   oxyCODONE-acetaminophen (PERCOCET/ROXICET) 5-325 MG tablet One tablet every six hours as needed for pain.   predniSONE (DELTASONE) 10 MG tablet Take  4 each am x 2 days,   2 each am x 2 days,  1 each am x 2 days and stop   Tiotropium Bromide-Olodaterol (STIOLTO RESPIMAT) 2.5-2.5 MCG/ACT AERS Inhale 2 puffs into the lungs daily.                   Past Medical History:  Diagnosis Date   Ankle fracture, right 06/2012   Anxiety    Arthritis    Colon polyps    adenomatous polyps on multiple colonoscopies, starting in his early 13s   COPD (chronic obstructive pulmonary disease) ([place])    Depression    History of DVT (deep vein thrombosis)    Hypertension    Kidney stones    Seizures ([place])    has seizures weekly   Sleep apnea    Stroke [facility]) 2005   post knee surgery in 2005         Objective:     Wts  07/06/2021       174  04/27/2021       164 03/06/2021     166   10/01/19 158 lb (71.7 kg)  06/25/19 165 lb (74.8 kg)  06/03/19 163 lb 2 oz (74 kg)     Vital signs reviewed  07/06/2021  - Note at rest 02 sats  99% on RA   General  appearance:    amb somber wm nad   HEENT : pt wearing mask not removed for exam due to covid -19 concerns.    NECK :  without JVD/Nodes/TM/ nl carotid upstrokes bilaterally   LUNGS: no acc muscle use,  Mod barrel  contour chest wall with bilateral  insp/exp rhonchi   and  without cough on insp or exp maneuvers and mod  Hyperresonant  to  percussion bilaterally     CV:  RRR  no s3 or murmur or increase in P2, and no edema   ABD:  soft and nontender with pos mid insp Hoover's  in the supine position. No bruits or organomegaly appreciated, bowel sounds nl  MS:     ext warm without deformities, calf tenderness, cyanosis or clubbing No obvious joint restrictions   SKIN: warm and dry without lesions    NEURO:  alert, approp, nl sensorium with  no motor or cerebellar deficits apparent.            I personally reviewed images and agree with radiology impression as follows:  Chest CTa 04/25/21 No evidence of pulmonary embolism. COPD changes with progressive chronic interstitial lung disease changes/fibrosis peripherally, greatest at lung bases. Three nodular foci are identified in the lower lobes largest 6 mm diameter; Non-contrast chest CT at 3-6 months is recommended      Assessment

## 2021-07-06 NOTE — Assessment & Plan Note (Addendum)
Quit smoking 03/2021 @ 160 ?- PFT's by [facility] around 2017 /18 reported "stage 3" ?- 10/01/2019  After extensive coaching inhaler device,  effectiveness =    90% with elipta >> continue trelegy   ?- PFT's  11/24/19   FEV1 1.51 (42 % ) ratio 0.35  p 1 % improvement from saba p "inhaler" 6 h prior to study with DLCO  12.75 (46%) corrects to 2.11 (50%)  for alv volume and FV curve classic concave exp flow vol  ?- alpha one AT screen   01/04/2020 >>> not done  ?- 03/06/2021  After extensive coaching inhaler device,  effectiveness =    75% continue trelegy and try off acei due to pseudowheeze on exam ?- 04/27/2021  After extensive coaching inhaler device,  effectiveness =    75% with smi/respimat so try stioilto 2 each am as breaking thru trelegy with lots of upper airway sympoms > preferred trelegy now that off acei and can tolerate the upper airway effects  ?- 07/06/2021   Walked on RA  x  3  lap(s) =  approx 450  ft  @ mod pace, stopped due to end of study with lowest 02 sats 94% and sob on 2nd lap   ? ? Group D in terms of symptom/risk and laba/lama/ICS  therefore appropriate rx at this point >>>  Continue trelegy / approp saba and pred as plan D  - see avs for instructions unique to this ov ?  ? ?Re SABA :  I spent extra time with pt today reviewing appropriate use of albuterol for prn use on exertion with the following points: ?1) saba is for relief of sob that does not improve by walking a slower pace or resting but rather if the pt does not improve after trying this first. ?2) If the pt is convinced, as many are, that saba helps recover from activity faster then it's easy to tell if this is the case by re-challenging : ie stop, take the inhaler, then p 5 minutes try the exact same activity (intensity of workload) that just caused the symptoms and see if they are substantially diminished or not after saba ?3) if there is an activity that reproducibly causes the symptoms, try the saba 15 min before the activity on  alternate days  ? ?If in fact the saba really does help, then fine to continue to use it prn but advised may need to look closer at the maintenance regimen being used to achieve better control of airways disease with exertion.  ? ?  ?

## 2021-07-06 NOTE — Patient Instructions (Addendum)
Plan A = Automatic = Always=    Trelegy 327 one click each am - take 2 good drags ? ?Plan B = Backup (to supplement plan A, not to replace it) ?Only use your albuterol inhaler as a rescue medication to be used if you can't catch your breath by resting or doing a relaxed purse lip breathing pattern.  ?- The less you use it, the better it will work when you need it. ?- Ok to use the inhaler up to 2 puffs  every 4 hours if you must but call for appointment if use goes up over your usual need ?- Don't leave home without it !!  (think of it like the spare tire for your car)  ? ?Plan C = Crisis (instead of Plan B but only if Plan B stops working) ?- only use your albuterol nebulizer if you first try Plan B and it fails to help > ok to use the nebulizer up to every 4 hours but if start needing it regularly call for immediate appointment ? ? ?Plan D = Deltasone = prednisone  ?- if ABC not correcting the problem > Prednisone 10 mg take  4 each am x 2 days,   2 each am x 2 days,  1 each am x 2 days and stop  ? ? ?Please schedule a follow up visit in 3 months but call sooner if needed  ?

## 2021-07-06 NOTE — Assessment & Plan Note (Signed)
See CTa 1/10/2 x 3 noudles largest 6 mm lower lobes> rec f/u CT at 6 m ?- CT scan in reminder file for mid June 2023 ? ?Discussed in detail all the  indications, usual  risks and alternatives  relative to the benefits with patient who agrees to proceed with w/u as outlined.    ?

## 2021-07-26 ENCOUNTER — Telehealth: Payer: Self-pay | Admitting: Orthopaedic Surgery

## 2021-07-26 MED ORDER — OXYCODONE-ACETAMINOPHEN 5-325 MG PO TABS: ORAL_TABLET | ORAL | 0 refills | Status: DC

## 2021-07-26 NOTE — Telephone Encounter (Signed)
Patient called requesting refill for his  pain medicine.  ? ?oxyCODONE-acetaminophen (PERCOCET/ROXICET) 5-325 MG tablet ? ?Pharmacy:  [facility]  ? ?

## 2021-07-28 ENCOUNTER — Other Ambulatory Visit: Payer: Self-pay | Admitting: Internal Medicine

## 2021-08-23 ENCOUNTER — Other Ambulatory Visit: Payer: Self-pay | Admitting: Radiology

## 2021-08-23 MED ORDER — OXYCODONE-ACETAMINOPHEN 5-325 MG PO TABS: ORAL_TABLET | ORAL | 0 refills | Status: DC

## 2021-08-23 NOTE — Telephone Encounter (Signed)
Patient called in requesting refill oxycodone.  [facility] [employer].  ?

## 2021-09-20 ENCOUNTER — Telehealth: Payer: Self-pay

## 2021-09-20 DIAGNOSIS — R918 Other nonspecific abnormal finding of lung field: Secondary | ICD-10-CM

## 2021-09-20 NOTE — Telephone Encounter (Signed)
-----   Message from Tanda Rockers, MD sent at 04/27/2021  1:39 PM EST ----- CT chest s contrast super D  f/u multiple pulmonary nodules due mid June

## 2021-09-20 NOTE — Telephone Encounter (Signed)
Order placed

## 2021-09-22 ENCOUNTER — Telehealth: Payer: Self-pay | Admitting: Orthopaedic Surgery

## 2021-09-22 NOTE — Telephone Encounter (Signed)
Patient called for refill: oxyCODONE-acetaminophen (PERCOCET/ROXICET) 5-325 MG tablet [facility] [employer]

## 2021-09-26 ENCOUNTER — Encounter: Payer: Self-pay | Admitting: Orthopaedic Surgery

## 2021-09-26 ENCOUNTER — Ambulatory Visit (INDEPENDENT_AMBULATORY_CARE_PROVIDER_SITE_OTHER): Payer: 59 | Admitting: Orthopaedic Surgery

## 2021-09-26 DIAGNOSIS — M25511 Pain in right shoulder: Secondary | ICD-10-CM

## 2021-09-26 DIAGNOSIS — G8929 Other chronic pain: Secondary | ICD-10-CM | POA: Diagnosis not present

## 2021-09-26 DIAGNOSIS — F17210 Nicotine dependence, cigarettes, uncomplicated: Secondary | ICD-10-CM

## 2021-09-26 MED ORDER — OXYCODONE-ACETAMINOPHEN 5-325 MG PO TABS: ORAL_TABLET | ORAL | 0 refills | Status: DC

## 2021-09-26 NOTE — Progress Notes (Signed)
PROCEDURE NOTE:  The patient request injection, verbal consent was obtained.  The right shoulder was prepped appropriately after time out was performed.   Sterile technique was observed and injection of 1 cc of DepoMedrol '40mg'$  with several cc's of plain xylocaine. Anesthesia was provided by ethyl chloride and a 20-gauge needle was used to inject the shoulder area. A posterior approach was used.  The injection was tolerated well.  A band aid dressing was applied.  The patient was advised to apply ice later today and tomorrow to the injection sight as needed.  Encounter Diagnoses  Name Primary?   Chronic right shoulder pain Yes   Nicotine dependence, cigarettes, uncomplicated    Return in two months.  Call if any problem.  Precautions discussed.  Electronically Signed Sanjuana Kava, MD 6/13/20239:35 AM

## 2021-10-02 ENCOUNTER — Ambulatory Visit (HOSPITAL_COMMUNITY)
Admission: RE | Admit: 2021-10-02 | Discharge: 2021-10-02 | Disposition: A | Discharge status: Home / Self Care | Payer: 59 | Source: Ambulatory Visit | Attending: Internal Medicine | Admitting: Internal Medicine

## 2021-10-02 DIAGNOSIS — R918 Other nonspecific abnormal finding of lung field: Secondary | ICD-10-CM

## 2021-10-04 ENCOUNTER — Other Ambulatory Visit: Payer: Self-pay

## 2021-10-04 DIAGNOSIS — F17210 Nicotine dependence, cigarettes, uncomplicated: Secondary | ICD-10-CM

## 2021-10-05 ENCOUNTER — Ambulatory Visit: Payer: Medicare HMO | Admitting: Internal Medicine

## 2021-10-24 ENCOUNTER — Telehealth: Payer: Self-pay | Admitting: Orthopaedic Surgery

## 2021-10-24 MED ORDER — OXYCODONE-ACETAMINOPHEN 5-325 MG PO TABS: ORAL_TABLET | ORAL | 0 refills | Status: DC

## 2021-10-24 NOTE — Telephone Encounter (Signed)
Patient called / refill:  oxyCODONE-acetaminophen (PERCOCET/ROXICET) 5-325 MG tablet 26 tablet      [facility] [employer]

## 2021-11-14 ENCOUNTER — Ambulatory Visit (INDEPENDENT_AMBULATORY_CARE_PROVIDER_SITE_OTHER): Payer: 59 | Admitting: Internal Medicine

## 2021-11-14 ENCOUNTER — Encounter: Payer: Self-pay | Admitting: Internal Medicine

## 2021-11-14 DIAGNOSIS — K219 Gastro-esophageal reflux disease without esophagitis: Secondary | ICD-10-CM

## 2021-11-14 DIAGNOSIS — J449 Chronic obstructive pulmonary disease, unspecified: Secondary | ICD-10-CM

## 2021-11-14 DIAGNOSIS — R918 Other nonspecific abnormal finding of lung field: Secondary | ICD-10-CM | POA: Diagnosis not present

## 2021-11-14 DIAGNOSIS — F17210 Nicotine dependence, cigarettes, uncomplicated: Secondary | ICD-10-CM | POA: Diagnosis not present

## 2021-11-14 MED ORDER — PREDNISONE 10 MG PO TABS
ORAL_TABLET | ORAL | 5 refills | Status: DC
Start: 2021-11-14 — End: 2021-12-11

## 2021-11-14 NOTE — Assessment & Plan Note (Signed)
Again advised ppi is q am ac and pepcid 20 mg q pm  - neither are prn in setting of DTC copd requiring pred cycles

## 2021-11-14 NOTE — Assessment & Plan Note (Signed)
See CTa 1/10/2 x 3 noudles largest 6 mm lower lobes> rec f/u CT at 6 m - CT super D done 10/02/21 > no change > referred for LDSCT yearly   Discussed in detail all the  indications, usual  risks and alternatives  relative to the benefits with patient who agrees to proceed with w/u as outlined.

## 2021-11-14 NOTE — Assessment & Plan Note (Signed)
Quit smoking 03/2021 @ 160 - PFT's by [facility] around 2017 /18 reported "stage 3" - 10/01/2019  After extensive coaching inhaler device,  effectiveness =    90% with elipta >> continue trelegy   - PFT's  11/24/19   FEV1 1.51 (42 % ) ratio 0.35  p 1 % improvement from saba p "inhaler" 6 h prior to study with DLCO  12.75 (46%) corrects to 2.11 (50%)  for alv volume and FV curve classic concave exp flow vol  - alpha one AT screen   01/04/2020 >>> not done  - 03/06/2021  After extensive coaching inhaler device,  effectiveness =    75% continue trelegy and try off acei due to pseudowheeze on exam - 04/27/2021  After extensive coaching inhaler device,  effectiveness =    75% with smi/respimat so try stioilto 2 each am as breaking thru trelegy with lots of upper airway sympoms > preferred trelegy  - 07/06/2021   Walked on RA  x  3  lap(s) =  approx 450  ft  @ mod pace, stopped due to end of study with lowest 02 sats 94% and sob on 2nd lap   - 07/06/2021 started pred x 6 days as PLAN D  - 11/14/2021   Walked on RA  x  3  lap(s) =  approx 450  ft  @ fast pace, stopped due to end of study s sob  with lowest 02 sats 93%     Group D (now reclassified as E) in terms of symptom/risk and laba/lama/ICS  therefore appropriate rx at this point >>>  Continue trelegy and approp saba with Pred x 6 days as plan D

## 2021-11-14 NOTE — Patient Instructions (Signed)
My office will be contacting you by phone for referral to lung cancer year program  - if you don't hear back from them in 2 weeks please call us back or notify us thru MyChart and we'll address it right away.   The key is to stop smoking completely before smoking completely stops you!   Nexium 40 mg Take 30-60 min before first meal of the day   GERD (REFLUX)  is an extremely common cause of respiratory symptoms just like yours , many times with no obvious heartburn at all.    It can be treated with medication, but also with lifestyle changes including elevation of the head of your bed (ideally with 6 -8inch blocks under the headboard of your bed),  Smoking cessation, avoidance of late meals, excessive alcohol, and avoid fatty foods, chocolate, peppermint, colas, red wine, and acidic juices such as orange juice.  NO MINT OR MENTHOL PRODUCTS SO NO COUGH DROPS  USE SUGARLESS CANDY INSTEAD (Jolley ranchers or Stover's or Life Savers) or even ice chips will also do - the key is to swallow to prevent all throat clearing. NO OIL BASED VITAMINS - use powdered substitutes.  Avoid fish oil when coughing.   Please schedule a follow up visit in 6  months but call sooner if needed

## 2021-11-14 NOTE — Progress Notes (Signed)
Adam Wheeler, male    DOB: 1956/02/04    MRN: [id number]   Brief patient profile:  62  yowm  quit smoking 04/09/21 with new onset doe x 2017 eval by [name] and [name] dx copd and rx trelegy self referred to the pulmonary clinic in [place] 10/01/2019    History of Present Illness  10/01/2019  Pulmonary/ 1st office eval/[name]  Chief Complaint  Patient presents with   Pulmonary Consult    Former pt of Dr [name]- COPD. He states today his breathing is "average"- needing samples of trelegy. He states this helps his SOB more than anything.   Dyspnea:  MMRC3 = can't walk 100 yards even at a slow pace at a flat grade s stopping due to sob  Difficulty with [employer] even going slowly and not checking sats/ worse since ran out of trelegy  Cough: none  Sleep: ok 30-45 degrees due to gerd not cough or sob  SABA use: once or twice a week rec Nexium 40 mg   Take  30-60 min before first meal of the day and Pepcid (famotidine)  20 mg one after supper or before bedtime  until return to office - this is the best way to tell whether stomach acid is contributing to your problem.   GERD  No change in trelegy for now - be sure to brush teeth and gargle with arm and hammer toothpaste Only use your albuterol as a rescue medication         03/06/2021  f/u ov/[name] office/[name] re: GOLD 3 still smoking/ maint on trelegy 100 each am  now on lisinopril  Chief Complaint  Patient presents with   Follow-up    Breathing and productive cough with white mucus have not improved since last ov.  Fllu shot today?   Dyspnea:  slt worse sits down when gets to store Cough: min white mucus/globus sensation Sleeping: noct choking new since started acei  SABA use: none  02: none  Covid status: vax x 4  Lung cancer screening: rec  Rec I recommend you discuss annual CT scanning for lung cancer  Stop lisinopril and take losartan 50 mg daily and have your PCP adjust it Please remember to go to the  x-ray  department  for your tests - we will call you with the results when they are available   The key is to stop smoking completely before smoking completely stops you! Please schedule a follow up visit in 3 months but call sooner if needed    04/27/2021  acute  ov/[name] office/[name] re: copd  GOLD 3/ just quit smoking/ s/p AECOPD while on trelegy and alb now up to 8 x day   Chief Complaint  Patient presents with   Follow-up    ED visit on 04/25/2021 for copd exacerbation was advised by ED to follow up with pulmonary. Pneumonia. Feels like he is having a hard time breathing.   Stopped smoking approx. 2 weeks ago.   Dyspnea:  sob across the room  Cough: white now Sleeping: on side/ bed 4in  bed blocks  SABA use: 4 x days / hfa  02: none  Covid status: x 4  Rec Plan A = Automatic = Always=    Stiolto 2 puffs 1st thing in am Work on inhaler technique Plan B = Backup (to supplement plan A, not to replace it) Only use your albuterol inhaler as a rescue medication Plan C = Crisis (instead of Plan B but only if  Plan B stops working) - only use your albuterol nebulizer if you first try Plan B  Prednisone 10 mg take  4 each am x 2 days,   2 each am x 2 days,  1 each am x 2 days and stop  For cough > mucinex dm 1200 mg every 12 hours as needed  Continue nexium Take 30-60 min before first meal of the day and while cough/ wheeze add pepcid 20 mg after suppers  Keep prior appt  - call sooner if needed    07/06/2021  f/u ov/[name] office/[name] re: GOLD 3  maint on trelegy preferred over stiolto   Chief Complaint  Patient presents with   Follow-up    SOB has not improved.  Wants to discuss getting prednisone and albuterol nebs   Dyspnea:  70 ft across  trailer lost ground  since off prednisone (not reproduced at ov  when walked 450 ft)   Cough: bette Sleeping: 45 degrees blocks under [place]  SABA use: once or twice daily  02: none  Covid status: vax x 4  Rec Plan A = Automatic =  Always=    Trelegy 741 one click each am - take 2 good drags Plan B = Backup (to supplement plan A, not to replace it) Only use your albuterol inhaler as a rescue medication  Plan C = Crisis (instead of Plan B but only if Plan B stops working) - only use your albuterol nebulizer if you first try Plan B and it fails to help Plan D = Deltasone = prednisone  - if ABC not correcting the problem > Prednisone 10 mg take  4 each am x 2 days,   2 each am x 2 days,  1 each am x 2 days and stop     11/14/2021  f/u ov/[place] office/[name] re: GOLD 3  maint on trelegy   Chief Complaint  Patient presents with   Follow-up    Feels breathing is about the same since last ov.   Dyspnea:  working out at [employer]  Cough: none  Sleeping: 45 degrees with overt reflux  SABA use: none  02: none        No obvious day to day or daytime variability or assoc excess/ purulent sputum or mucus plugs or hemoptysis or cp or chest tightness, subjective wheeze or overt sinus or hb symptoms.   Sleeping as above without nocturnal  or early am exacerbation  of respiratory  c/o's or need for noct saba. Also denies any obvious fluctuation of symptoms with weather or environmental changes or other aggravating or alleviating factors except as outlined above   No unusual exposure hx or h/o childhood pna/ asthma or knowledge of premature birth.  Current Allergies, Complete Past Medical History, Past Surgical History, Family History, and Social History were reviewed in [employer] record.  ROS  The following are not active complaints unless bolded Hoarseness, sore throat, dysphagia, dental problems, itching, sneezing,  nasal congestion or discharge of excess mucus or purulent secretions, ear ache,   fever, chills, sweats, unintended wt loss or wt gain, classically pleuritic or exertional cp,  orthopnea pnd or arm/hand swelling  or leg swelling, presyncope, palpitations, abdominal pain, anorexia, nausea,  vomiting, diarrhea  or change in bowel habits or change in bladder habits, change in stools or change in urine, dysuria, hematuria,  rash, arthralgias, visual complaints, headache, numbness, weakness or ataxia or problems with walking or coordination,  change in mood or  memory.        Current Meds  Medication Sig   albuterol (PROVENTIL) (2.5 MG/3ML) 0.083% nebulizer solution Take 3 mLs (2.5 mg total) by nebulization every 6 (six) hours as needed for wheezing or shortness of breath.   albuterol (VENTOLIN HFA) 108 (90 Base) MCG/ACT inhaler Inhale 2 puffs into the lungs every 6 (six) hours as needed for wheezing or shortness of breath.   ALPRAZolam (XANAX) 1 MG tablet Take 1 mg by mouth 2 (two) times daily as needed for anxiety.   diclofenac (VOLTAREN) 75 MG EC tablet Take 1 tablet (75 mg total) by mouth 2 (two) times daily with a meal.   dicyclomine (BENTYL) 10 MG capsule Take 1 capsule (10 mg total) by mouth 4 (four) times daily -  before meals and at bedtime. As needed for looser stool. Monitor for constipation, dry mouth, dizziness. (Patient taking differently: Take 10 mg by mouth 3 (three) times daily as needed for spasms. As needed for looser stool. Monitor for constipation, dry mouth, dizziness.)   famotidine (PEPCID) 20 MG tablet One after supper (Patient taking differently: Take 20 mg by mouth every evening. One after supper)   FLUoxetine (PROZAC) 40 MG capsule Take 40 mg by mouth every morning.   Fluticasone-Umeclidin-Vilant (TRELEGY ELLIPTA) 100-62.5-25 MCG/ACT AEPB INHALE 1 PUFFS BY MOUTH ONCE DAILY   losartan (COZAAR) 50 MG tablet Take 1 tablet (50 mg total) by mouth daily.   NEXIUM 40 MG capsule Take 40 mg by mouth daily as needed (heart burn).   oxyCODONE-acetaminophen (PERCOCET/ROXICET) 5-325 MG tablet One tablet every six hours as needed for pain.   predniSONE (DELTASONE) 10 MG tablet Take  4 each am x 2 days,   2 each am x 2 days,  1 each am x 2 days and stop            Past  Medical History:  Diagnosis Date   Ankle fracture, right 06/2012   Anxiety    Arthritis    Colon polyps    adenomatous polyps on multiple colonoscopies, starting in his early 32s   COPD (chronic obstructive pulmonary disease) ([place])    Depression    History of DVT (deep vein thrombosis)    Hypertension    Kidney stones    Seizures ([place])    has seizures weekly   Sleep apnea    Stroke [facility] 2005   post knee surgery in 2005         Objective:     Wts  11/14/2021         178 07/06/2021       174  04/27/2021       164 03/06/2021     166   10/01/19 158 lb (71.7 kg)  06/25/19 165 lb (74.8 kg)  06/03/19 163 lb 2 oz (74 kg)    Vital signs reviewed  11/14/2021  - Note at rest 02 sats  97% on RA   General appearance:    amb slt hoarse wm nad   HEENT : Oropharynx  clear  Nasal turbinates nl    NECK :  without  apparent JVD/ palpable Nodes/TM    LUNGS: no acc muscle use,  Mild barrel  contour chest wall with bilateral  Distant exp rhonchi  and  without cough on insp or exp maneuvers  and mild  Hyperresonant  to  percussion bilaterally     CV:  RRR  no s3 or murmur or increase in P2, and no edema  ABD:  soft and nontender with pos end  insp Hoover's  in the supine position.  No bruits or organomegaly appreciated   MS:  Nl gait/ ext warm without deformities Or obvious joint restrictions  calf tenderness, cyanosis or clubbing     SKIN: warm and dry without lesions    NEURO:  alert, approp, nl sensorium with  no motor or cerebellar deficits apparent.              I personally reviewed images and agree with radiology impression as follows:  CT super D done 10/02/21 > no change mpns > referred for LDSCT yearly       Assessment

## 2021-11-14 NOTE — Assessment & Plan Note (Signed)
Counseled re importance of smoking cessation but did not meet time criteria for separate billing           Each maintenance medication was reviewed in detail including emphasizing most importantly the difference between maintenance and prns and under what circumstances the prns are to be triggered using an action plan format where appropriate.  Total time for H and P, chart review, counseling, reviewing hfa /nebdevice(s) and generating customized AVS unique to this office visit / same day charting = 25 min       

## 2021-11-22 ENCOUNTER — Telehealth: Payer: Self-pay | Admitting: Orthopaedic Surgery

## 2021-11-22 NOTE — Telephone Encounter (Signed)
Patient called for refill oxyCODONE-acetaminophen (PERCOCET/ROXICET) 5-325 MG tablet 26 tablet      [facility]

## 2021-11-23 MED ORDER — OXYCODONE-ACETAMINOPHEN 5-325 MG PO TABS: ORAL_TABLET | ORAL | 0 refills | Status: DC

## 2021-12-11 ENCOUNTER — Telehealth: Payer: Self-pay | Admitting: Internal Medicine

## 2021-12-11 MED ORDER — PREDNISONE 10 MG PO TABS: ORAL_TABLET | ORAL | 5 refills | Status: DC

## 2021-12-11 NOTE — Telephone Encounter (Signed)
ATC patient for clarification on message? No answer LMTCB

## 2021-12-11 NOTE — Telephone Encounter (Signed)
Clarified with Kenney Houseman- patient was asking for refills on prednisone to be sent to new pharmacy. Refills sent. Nothing further needed

## 2021-12-19 ENCOUNTER — Ambulatory Visit (INDEPENDENT_AMBULATORY_CARE_PROVIDER_SITE_OTHER): Payer: 59 | Admitting: Orthopaedic Surgery

## 2021-12-19 ENCOUNTER — Encounter: Payer: Self-pay | Admitting: Orthopaedic Surgery

## 2021-12-19 VITALS — BP 164/113 | HR 62 | Ht 70.0 in | Wt 175.0 lb

## 2021-12-19 DIAGNOSIS — G8929 Other chronic pain: Secondary | ICD-10-CM | POA: Diagnosis not present

## 2021-12-19 DIAGNOSIS — M25511 Pain in right shoulder: Secondary | ICD-10-CM | POA: Diagnosis not present

## 2021-12-19 DIAGNOSIS — F17210 Nicotine dependence, cigarettes, uncomplicated: Secondary | ICD-10-CM

## 2021-12-19 MED ORDER — OXYCODONE-ACETAMINOPHEN 5-325 MG PO TABS: ORAL_TABLET | ORAL | 0 refills | Status: DC

## 2021-12-19 MED ORDER — METHYLPREDNISOLONE ACETATE 40 MG/ML IJ SUSP
40.0000 mg | Freq: Once | INTRAMUSCULAR | Status: AC
  Administered 2021-12-19: 40 mg via INTRA_ARTICULAR

## 2021-12-19 NOTE — Addendum Note (Signed)
Addended by: Obie Dredge A on: 12/19/2021 11:16 AM   Modules accepted: Orders

## 2021-12-19 NOTE — Progress Notes (Signed)
PROCEDURE NOTE:  The patient request injection, verbal consent was obtained.  The right shoulder was prepped appropriately after time out was performed.   Sterile technique was observed and injection of 1 cc of DepoMedrol '40mg'$  with several cc's of plain xylocaine. Anesthesia was provided by ethyl chloride and a 20-gauge needle was used to inject the shoulder area. A posterior approach was used.  The injection was tolerated well.  A band aid dressing was applied.  The patient was advised to apply ice later today and tomorrow to the injection sight as needed.  Encounter Diagnoses  Name Primary?   Chronic right shoulder pain Yes   Nicotine dependence, cigarettes, uncomplicated    I have reviewed the [place] web site prior to prescribing narcotic medicine for this patient.  Return in two months.  Call if any problem.  Precautions discussed.  Electronically Signed Sanjuana Kava, MD 9/5/20238:02 AM

## 2021-12-22 ENCOUNTER — Encounter: Payer: Self-pay | Admitting: Orthopaedic Surgery

## 2022-01-15 ENCOUNTER — Telehealth: Payer: Self-pay | Admitting: Internal Medicine

## 2022-01-15 NOTE — Telephone Encounter (Signed)
Pt requesting appt with Dr. Melvyn Novas due to not breathing as good as he should be.  No available appt in [place] next week.  Offered [place] for appt but would rather come to [place].  Please advise.   Dr. Melvyn Novas please advise, is it okay to add patient on to [facility] for pm appt next week?

## 2022-01-15 NOTE — Telephone Encounter (Signed)
Ok for Tuesday afternoon if don't have anything open up before that

## 2022-01-16 NOTE — Telephone Encounter (Signed)
Atc patient. LMTCB  

## 2022-01-16 NOTE — Telephone Encounter (Signed)
Patient returned call and is scheduled for 10/10 at 3:30 nothing further needed at this time

## 2022-01-17 ENCOUNTER — Other Ambulatory Visit: Payer: Self-pay | Admitting: Orthopaedic Surgery

## 2022-01-17 MED ORDER — OXYCODONE-ACETAMINOPHEN 5-325 MG PO TABS: ORAL_TABLET | ORAL | 0 refills | Status: DC

## 2022-01-17 NOTE — Telephone Encounter (Signed)
Patient requests refill:   oxyCODONE-acetaminophen (PERCOCET/ROXICET) 5-325 MG tablet 26 tablet      [facility], [place], [place]  (Patient aware Dr Luna Glasgow is out of office, please advise)

## 2022-01-18 ENCOUNTER — Encounter: Payer: Self-pay | Admitting: Internal Medicine

## 2022-01-18 ENCOUNTER — Ambulatory Visit (INDEPENDENT_AMBULATORY_CARE_PROVIDER_SITE_OTHER): Payer: 59 | Admitting: Internal Medicine

## 2022-01-18 DIAGNOSIS — R918 Other nonspecific abnormal finding of lung field: Secondary | ICD-10-CM | POA: Diagnosis not present

## 2022-01-18 DIAGNOSIS — J449 Chronic obstructive pulmonary disease, unspecified: Secondary | ICD-10-CM

## 2022-01-18 DIAGNOSIS — F17210 Nicotine dependence, cigarettes, uncomplicated: Secondary | ICD-10-CM | POA: Diagnosis not present

## 2022-01-18 MED ORDER — PREDNISONE 10 MG PO TABS: ORAL_TABLET | ORAL | 11 refills | Status: DC

## 2022-01-18 NOTE — Progress Notes (Signed)
Adam Wheeler, male    DOB: 06/06/1955    MRN: [id number]   Brief patient profile:  40  yowm  quit smoking 04/09/21 with new onset doe x 2017 eval by [name] and [name] dx copd and rx trelegy self referred to the pulmonary clinic in [name] 10/01/2019    History of Present Illness  10/01/2019  Pulmonary/ 1st office eval/[name]  Chief Complaint  Patient presents with   Pulmonary Consult    Former pt of Dr [name]- COPD. He states today his breathing is "average"- needing samples of trelegy. He states this helps his SOB more than anything.   Dyspnea:  MMRC3 = can't walk 100 yards even at a slow pace at a flat grade s stopping due to sob  Difficulty with [employer] even going slowly and not checking sats/ worse since ran out of trelegy  Cough: none  Sleep: ok 30-45 degrees due to gerd not cough or sob  SABA use: once or twice a week rec Nexium 40 mg   Take  30-60 min before first meal of the day and Pepcid (famotidine)  20 mg one after supper or before bedtime  until return to office - this is the best way to tell whether stomach acid is contributing to your problem.   GERD  No change in trelegy for now - be sure to brush teeth and gargle with arm and hammer toothpaste Only use your albuterol as a rescue medication     07/06/2021  f/u ov/[place] office/[name] re: GOLD 3  maint on trelegy preferred over stiolto   Chief Complaint  Patient presents with   Follow-up    SOB has not improved.  Wants to discuss getting prednisone and albuterol nebs   Dyspnea:  70 ft across  trailer lost ground  since off prednisone (not reproduced at ov  when walked 450 ft)   Cough: bette Sleeping: 45 degrees blocks under [place]  SABA use: once or twice daily  02: none  Covid status: vax x 4  Rec Plan A = Automatic = Always=    Trelegy 341 one click each am - take 2 good drags Plan B = Backup (to supplement plan A, not to replace it) Only use your albuterol inhaler as a rescue medication  Plan  C = Crisis (instead of Plan B but only if Plan B stops working) - only use your albuterol nebulizer if you first try Plan B and it fails to help Plan D = Deltasone = prednisone  - if ABC not correcting the problem > Prednisone 10 mg take  4 each am x 2 days,   2 each am x 2 days,  1 each am x 2 days and stop     11/14/2021  f/u ov/[place] office/[name] re: GOLD 3  maint on trelegy   Chief Complaint  Patient presents with   Follow-up    Feels breathing is about the same since last ov.   Dyspnea:  working out at [employer]  Cough: none  Sleeping: 45 degrees with overt reflux  SABA use: none  02: none  Rec My office will be contacting you by phone for referral to lung cancer year program   The key is to stop smoking completely before smoking completely stops you! Nexium 40 mg Take 30-60 min before first meal of the day  GERD diet     01/18/2022  f/u ov/[place] office/[name] re: GOLD 3  maint on Trelegy   Chief Complaint  Patient  presents with   Follow-up    SOB has gotten worse since last ov   Dyspnea:  no longer working out at [employer]/ difficulty singing  Cough: uri x 2 weeks / white congested cough on mucinex dm assoc nasal congestion  Sleeping: 45 degree bed  SABA use: saba hfa / not using neb - can't find mask 02: none   Did not use plan D as above   No obvious day to day or daytime variability or assoc  purulent sputum or mucus plugs or hemoptysis or cp or chest tightness, subjective wheeze or overt    symptoms.   Sleeping as above  without nocturnal  or early am exacerbation  of respiratory  c/o's or need for noct saba. Also denies any obvious fluctuation of symptoms with weather or environmental changes or other aggravating or alleviating factors except as outlined above   No unusual exposure hx or h/o childhood pna/ asthma or knowledge of premature birth.  Current Allergies, Complete Past Medical History, Past Surgical History, Family History, and Social History were reviewed in  [employer] record.  ROS  The following are not active complaints unless bolded Hoarseness, sore throat, dysphagia, dental problems, itching, sneezing,  nasal congestion or discharge of excess mucus or purulent secretions, ear ache,   fever, chills, sweats, unintended wt loss or wt gain, classically pleuritic or exertional cp,  orthopnea pnd or arm/hand swelling  or leg swelling, presyncope, palpitations, abdominal pain, anorexia, nausea, vomiting, diarrhea  or change in bowel habits or change in bladder habits, change in stools or change in urine, dysuria, hematuria,  rash, arthralgias, visual complaints, headache, numbness, weakness or ataxia or problems with walking or coordination,  change in mood or  memory.        Current Meds  Medication Sig   albuterol (PROVENTIL) (2.5 MG/3ML) 0.083% nebulizer solution Take 3 mLs (2.5 mg total) by nebulization every 6 (six) hours as needed for wheezing or shortness of breath.   albuterol (VENTOLIN HFA) 108 (90 Base) MCG/ACT inhaler Inhale 2 puffs into the lungs every 6 (six) hours as needed for wheezing or shortness of breath.   ALPRAZolam (XANAX) 1 MG tablet Take 1 mg by mouth 2 (two) times daily as needed for anxiety.   diclofenac (VOLTAREN) 75 MG EC tablet Take 1 tablet (75 mg total) by mouth 2 (two) times daily with a meal.   dicyclomine (BENTYL) 10 MG capsule Take 1 capsule (10 mg total) by mouth 4 (four) times daily -  before meals and at bedtime. As needed for looser stool. Monitor for constipation, dry mouth, dizziness. (Patient taking differently: Take 10 mg by mouth 3 (three) times daily as needed for spasms. As needed for looser stool. Monitor for constipation, dry mouth, dizziness.)   famotidine (PEPCID) 20 MG tablet One after supper (Patient taking differently: Take 20 mg by mouth every evening. One after supper)   FLUoxetine (PROZAC) 40 MG capsule Take 40 mg by mouth every morning.   Fluticasone-Umeclidin-Vilant (TRELEGY  ELLIPTA) 100-62.5-25 MCG/ACT AEPB INHALE 1 PUFFS BY MOUTH ONCE DAILY   losartan (COZAAR) 50 MG tablet Take 1 tablet (50 mg total) by mouth daily.   NEXIUM 40 MG capsule Take 40 mg by mouth daily as needed (heart burn).   oxyCODONE-acetaminophen (PERCOCET/ROXICET) 5-325 MG tablet One tablet every six hours as needed for pain.                   Past Medical History:  Diagnosis Date  Ankle fracture, right 06/2012   Anxiety    Arthritis    Colon polyps    adenomatous polyps on multiple colonoscopies, starting in his early 19s   COPD (chronic obstructive pulmonary disease) ([facility])    Depression    History of DVT (deep vein thrombosis)    Hypertension    Kidney stones    Seizures ([place])    has seizures weekly   Sleep apnea    Stroke [facility]) 2005   post knee surgery in 2005         Objective:     Wts  01/18/2022       175  11/14/2021         178 07/06/2021       174  04/27/2021       164 03/06/2021     166   10/01/19 158 lb (71.7 kg)  06/25/19 165 lb (74.8 kg)  06/03/19 163 lb 2 oz (74 kg)     Vital signs reviewed  01/18/2022  - Note at rest 02 sats  96% on RA   General appearance:    amb wm poor dentition    HEENT : Oropharynx  clear  Nasal turbinates mod nonspecific edema   NECK :  without  apparent JVD/ palpable Nodes/TM    LUNGS: no acc muscle use,  Mild barrel  contour chest wall with bilateral  Distant late exp rhonchi  and  without cough on insp or exp maneuvers  and mild  Hyperresonant  to  percussion bilaterally     CV:  RRR  no s3 or murmur or increase in P2, and no edema   ABD:  soft and nontender with pos end  insp Hoover's  in the supine position.  No bruits or organomegaly appreciated   MS:  Nl gait/ ext warm without deformities Or obvious joint restrictions  calf tenderness, cyanosis or clubbing     SKIN: warm and dry without lesions    NEURO:  alert, approp, nl sensorium with  no motor or cerebellar deficits apparent.                  Assessment

## 2022-01-18 NOTE — Patient Instructions (Signed)
Plan A = Automatic = Always=    Trelegy 975 one click each am   Plan B = Backup (to supplement plan A, not to replace it) Only use your albuterol inhaler as a rescue medication to be used if you can't catch your breath by resting or doing a relaxed purse lip breathing pattern.  - The less you use it, the better it will work when you need it. - Ok to use the inhaler up to 2 puffs  every 4 hours if you must but call for appointment if use goes up over your usual need - Don't leave home without it !!  (think of it like the spare tire for your car)   Plan C = Crisis (instead of Plan B but only if Plan B stops working) - only use your albuterol nebulizer if you first try Plan B and it fails to help > ok to use the nebulizer up to every 4 hours but if start needing it regularly call for immediate appointment   Plan D = Deltasone - if ABC not correcting the problem > Prednisone 10 mg take  4 each am x 2 days,   2 each am x 2 days,  1 each am x 2 days and stop   Keep previous appointment

## 2022-01-19 ENCOUNTER — Encounter: Payer: Self-pay | Admitting: Internal Medicine

## 2022-01-19 ENCOUNTER — Other Ambulatory Visit: Payer: Self-pay | Admitting: Orthopaedic Surgery

## 2022-01-19 NOTE — Assessment & Plan Note (Signed)
See CTa 1/10/2 x 3 noudles largest 6 mm lower lobes> rec f/u CT at 6 m - CT super D done 10/02/21 > no change > referred for LDSCT yearly   Referred again to be sure he is in system

## 2022-01-19 NOTE — Assessment & Plan Note (Signed)
Counseled re importance of smoking cessation but did not meet time criteria for separate billing           Each maintenance medication was reviewed in detail including emphasizing most importantly the difference between maintenance and prns and under what circumstances the prns are to be triggered using an action plan format where appropriate.  Total time for H and P, chart review, counseling, reviewing dpi/hfa/neb device(s) and generating customized AVS unique to this office visit / same day charting =  25 min

## 2022-01-19 NOTE — Assessment & Plan Note (Signed)
Quit smoking 03/2021 @ 160 - PFT's by [facility] around 2017 /18 reported "stage 3" - 10/01/2019  After extensive coaching inhaler device,  effectiveness =    90% with elipta >> continue trelegy   - PFT's  11/24/19   FEV1 1.51 (42 % ) ratio 0.35  p 1 % improvement from saba p "inhaler" 6 h prior to study with DLCO  12.75 (46%) corrects to 2.11 (50%)  for alv volume and FV curve classic concave exp flow vol  - alpha one AT screen   01/04/2020 >>> not done  - 03/06/2021  After extensive coaching inhaler device,  effectiveness =    75% continue trelegy and try off acei due to pseudowheeze on exam - 04/27/2021  After extensive coaching inhaler device,  effectiveness =    75% with smi/respimat so try stioilto 2 each am as breaking thru trelegy with lots of upper airway sympoms > preferred trelegy  - 07/06/2021   Walked on RA  x  3  lap(s) =  approx 450  ft  @ mod pace, stopped due to end of study with lowest 02 sats 94% and sob on 2nd lap   - 07/06/2021 started pred x 6 days as PLAN D  - 11/14/2021   Walked on RA  x  3  lap(s) =  approx 450  ft  @ fast pace, stopped due to end of study s sob  with lowest 02 sats 93%     Group D (now reclassified as E) in terms of symptom/risk and laba/lama/ICS  therefore appropriate rx at this point >>>  trelegy and approp saba plus prednisone as plan D above

## 2022-01-22 ENCOUNTER — Other Ambulatory Visit: Payer: Self-pay | Admitting: Orthopaedic Surgery

## 2022-01-22 NOTE — Telephone Encounter (Signed)
Is he out ???  Until when???

## 2022-01-23 ENCOUNTER — Ambulatory Visit: Payer: 59 | Admitting: Internal Medicine

## 2022-02-20 ENCOUNTER — Ambulatory Visit (INDEPENDENT_AMBULATORY_CARE_PROVIDER_SITE_OTHER): Payer: 59 | Admitting: Orthopaedic Surgery

## 2022-02-20 ENCOUNTER — Encounter: Payer: Self-pay | Admitting: Orthopaedic Surgery

## 2022-02-20 DIAGNOSIS — G8929 Other chronic pain: Secondary | ICD-10-CM

## 2022-02-20 DIAGNOSIS — M25511 Pain in right shoulder: Secondary | ICD-10-CM

## 2022-02-20 DIAGNOSIS — F17210 Nicotine dependence, cigarettes, uncomplicated: Secondary | ICD-10-CM

## 2022-02-20 MED ORDER — OXYCODONE-ACETAMINOPHEN 5-325 MG PO TABS: ORAL_TABLET | ORAL | 0 refills | Status: DC

## 2022-02-20 NOTE — Progress Notes (Deleted)
   Subjective:    Patient ID: YEHOSHUA VITELLI, male    DOB: May 22, 1955, 66 y.o.   MRN: [id number]  HPI    Review of Systems     Objective:   Physical Exam        Assessment & Plan:     Procedure Note  Patient: DARREN CALDRON             Date of Birth: [birth year]           MRN: [id number]             Visit Date: 02/20/2022  Procedures: Visit Diagnoses:  1. Chronic right shoulder pain   2. Nicotine dependence, cigarettes, uncomplicated     Large Joint Inj on 02/20/2022 8:27 AM

## 2022-02-20 NOTE — Progress Notes (Signed)
PROCEDURE NOTE:  The patient request injection, verbal consent was obtained.  The right shoulder was prepped appropriately after time out was performed.   Sterile technique was observed and injection of 1 cc of DepoMedrol '40mg'$  with several cc's of plain xylocaine. Anesthesia was provided by ethyl chloride and a 20-gauge needle was used to inject the shoulder area. A posterior approach was used.  The injection was tolerated well.  A band aid dressing was applied.  The patient was advised to apply ice later today and tomorrow to the injection sight as needed.  Encounter Diagnoses  Name Primary?   Chronic right shoulder pain Yes   Nicotine dependence, cigarettes, uncomplicated    Return in two months.  I have reviewed the [place] web site prior to prescribing narcotic medicine for this patient.  Call if any problem.  Precautions discussed.  Electronically Signed Sanjuana Kava, MD 11/7/20238:26 AM

## 2022-02-21 DIAGNOSIS — M25511 Pain in right shoulder: Secondary | ICD-10-CM | POA: Diagnosis not present

## 2022-02-21 MED ORDER — METHYLPREDNISOLONE ACETATE 40 MG/ML IJ SUSP
40.0000 mg | Freq: Once | INTRAMUSCULAR | Status: AC
  Administered 2022-02-21: 40 mg via INTRA_ARTICULAR

## 2022-02-21 NOTE — Addendum Note (Signed)
Addended by: Obie Dredge A on: 02/21/2022 07:44 AM   Modules accepted: Orders

## 2022-03-05 ENCOUNTER — Encounter: Payer: Self-pay | Admitting: Internal Medicine

## 2022-03-05 ENCOUNTER — Ambulatory Visit (INDEPENDENT_AMBULATORY_CARE_PROVIDER_SITE_OTHER): Payer: 59 | Admitting: Internal Medicine

## 2022-03-05 VITALS — BP 132/78 | HR 86 | Temp 98.4°F | Ht 70.0 in | Wt 170.2 lb

## 2022-03-05 DIAGNOSIS — R918 Other nonspecific abnormal finding of lung field: Secondary | ICD-10-CM

## 2022-03-05 DIAGNOSIS — J449 Chronic obstructive pulmonary disease, unspecified: Secondary | ICD-10-CM | POA: Diagnosis not present

## 2022-03-05 DIAGNOSIS — F17210 Nicotine dependence, cigarettes, uncomplicated: Secondary | ICD-10-CM

## 2022-03-05 MED ORDER — BREZTRI AEROSPHERE 160-9-4.8 MCG/ACT IN AERO: 2.0000 | INHALATION_SPRAY | Freq: Two times a day (BID) | RESPIRATORY_TRACT | 11 refills | Status: DC

## 2022-03-05 NOTE — Progress Notes (Unsigned)
Adam Wheeler, male    DOB: 1955/06/12    MRN: [id number]   Brief patient profile:  60  yowm  quit smoking 04/09/21 with new onset doe x 2017 eval by [name] and [name] dx copd and rx trelegy self referred to the pulmonary clinic in [place] 10/01/2019    History of Present Illness  10/01/2019  Pulmonary/ 1st Wheeler eval/Adam [facility]  Chief Complaint  Patient presents with   Pulmonary Consult    Former pt of Dr [name]- COPD. He states today his breathing is "average"- needing samples of trelegy. He states this helps his SOB more than anything.   Dyspnea:  MMRC3 = can't walk 100 yards even at a slow pace at a flat grade s stopping due to sob  Difficulty with [employer] even going slowly and not checking sats/ worse since ran out of trelegy  Cough: none  Sleep: ok 30-45 degrees due to gerd not cough or sob  SABA use: once or twice a week rec Nexium 40 mg   Take  30-60 min before first meal of the day and Pepcid (famotidine)  20 mg one after supper or before bedtime  until return to [facility] - this is the best way to tell whether stomach acid is contributing to your problem.   GERD  No change in trelegy for now - be sure to brush teeth and gargle with arm and hammer toothpaste Only use your albuterol as a rescue medication    01/18/2022  f/u ov/Adam Wheeler/Adam Wheeler re: GOLD 3  maint on Trelegy   Chief Complaint  Patient presents with   Follow-up    SOB has gotten worse since last ov   Dyspnea:  no longer working out at [employer]/ difficulty singing  Cough: uri x 2 weeks / white congested cough on mucinex dm assoc nasal congestion  Sleeping: 45 degree bed  SABA use: saba hfa / not using neb - can't find mask 02: none  Did not use plan D as above Rec Plan A = Automatic = Always=    Trelegy 003 one click each am  Plan B = Backup (to supplement plan A, not to replace it) Only use your albuterol inhaler as a rescue medication Plan C = Crisis (instead of Plan B but only if Plan B stops working) -  only use your albuterol nebulizer if you first try Plan B Plan D = Deltasone - if ABC not correcting the problem > Prednisone 10 mg take  4 each am x 2 days,   2 each am x 2 days,  1 each am x 2 days and stop     03/05/2022  f/u ov/Adam Wheeler/Adam Wheeler re: GOLD 3  maint on trelegy / still off cigs  Chief Complaint  Patient presents with   Follow-up    Was seen at a UC in [place] and told his R lung collapsed partially and he was having a COPD flare up.   Dyspnea:  no longer working working out  Cough: yellow mucus rx doxy per UC  Sleeping: 45 degree bed  SABA use: not every day hfa/ twice a week neb  02: none  Covid status: vax max  Lung cancer screening: referred    No obvious day to day or daytime variability or assoc e  mucus plugs or hemoptysis or cp or chest tightness, subjective wheeze or overt sinus or hb symptoms.   Sleeping now  without nocturnal  or early am exacerbation  of respiratory  c/o's or  need for noct saba. Also denies any obvious fluctuation of symptoms with weather or environmental changes or other aggravating or alleviating factors except as outlined above   No unusual exposure hx or h/o childhood pna/ asthma or knowledge of premature birth.  Current Allergies, Complete Past Medical History, Past Surgical History, Family History, and Social History were reviewed in [employer] record.  ROS  The following are not active complaints unless bolded Hoarseness, sore throat, dysphagia, dental problems, itching, sneezing,  nasal congestion or discharge of excess mucus or purulent secretions, ear ache,   fever, chills, sweats, unintended wt loss or wt gain, classically pleuritic or exertional cp,  orthopnea pnd or arm/hand swelling  or leg swelling, presyncope, palpitations, abdominal pain, anorexia, nausea, vomiting, diarrhea  or change in bowel habits or change in bladder habits, change in stools or change in urine, dysuria, hematuria,  rash,  arthralgias, visual complaints, headache, numbness, weakness or ataxia or problems with walking or coordination,  change in mood or  memory.        Current Meds  Medication Sig   albuterol (PROVENTIL) (2.5 MG/3ML) 0.083% nebulizer solution Take 3 mLs (2.5 mg total) by nebulization every 6 (six) hours as needed for wheezing or shortness of breath.   albuterol (VENTOLIN HFA) 108 (90 Base) MCG/ACT inhaler Inhale 2 puffs into the lungs every 6 (six) hours as needed for wheezing or shortness of breath.   ALPRAZolam (XANAX) 1 MG tablet Take 1 mg by mouth 2 (two) times daily as needed for anxiety.   diclofenac (VOLTAREN) 75 MG EC tablet Take 1 tablet (75 mg total) by mouth 2 (two) times daily with a meal.   dicyclomine (BENTYL) 10 MG capsule Take 1 capsule (10 mg total) by mouth 4 (four) times daily -  before meals and at bedtime. As needed for looser stool. Monitor for constipation, dry mouth, dizziness. (Patient taking differently: Take 10 mg by mouth 3 (three) times daily as needed for spasms. As needed for looser stool. Monitor for constipation, dry mouth, dizziness.)   doxycycline (VIBRAMYCIN) 100 MG capsule Take 100 mg by mouth 2 (two) times daily.   famotidine (PEPCID) 20 MG tablet One after supper (Patient taking differently: Take 20 mg by mouth every evening. One after supper)   FLUoxetine (PROZAC) 40 MG capsule Take 40 mg by mouth every morning.   Fluticasone-Umeclidin-Vilant (TRELEGY ELLIPTA) 100-62.5-25 MCG/ACT AEPB INHALE 1 PUFFS BY MOUTH ONCE DAILY   losartan (COZAAR) 50 MG tablet Take 1 tablet (50 mg total) by mouth daily.   NEXIUM 40 MG capsule Take 40 mg by mouth daily as needed (heart burn).   oxyCODONE-acetaminophen (PERCOCET/ROXICET) 5-325 MG tablet TAKE ONE TABLET BY MOUTH EVERY 6 HOURS AS NEEDED FOR PAIN   predniSONE (DELTASONE) 10 MG tablet Take by mouth.           Past Medical History:  Diagnosis Date   Ankle fracture, right 06/2012   Anxiety    Arthritis    Colon polyps     adenomatous polyps on multiple colonoscopies, starting in his early 20s   COPD (chronic obstructive pulmonary disease) ([place])    Depression    History of DVT (deep vein thrombosis)    Hypertension    Kidney stones    Seizures ([place])    has seizures weekly   Sleep apnea    Stroke [facility]) 2005   post knee surgery in 2005         Objective:     Wts  03/05/2022      170 01/18/2022       175  11/14/2021         178 07/06/2021       174  04/27/2021       164 03/06/2021     166   10/01/19 158 lb (71.7 kg)  06/25/19 165 lb (74.8 kg)  06/03/19 163 lb 2 oz (74 kg)    Vital signs reviewed  03/05/2022  - Note at rest 02 sats  97% on RA   General appearance:    slt hoarse amb wm/ slt nasal tone to voice    HEENT : Oropharynx  clear /  Nasal turbinates mild turbinate edema    NECK :  without  apparent JVD/ palpable Nodes/TM    LUNGS: no acc muscle use,  Mild barrel  contour chest wall with bilateral  Distant bs s audible wheeze and  without cough on insp or exp maneuvers  and mild  Hyperresonant  to  percussion bilaterally     CV:  RRR  no s3 or murmur or increase in P2, and no edema   ABD:  soft and nontender with pos end  insp Hoover's  in the supine position.  No bruits or organomegaly appreciated   MS:  Nl gait/ ext warm without deformities Or obvious joint restrictions  calf tenderness, cyanosis or clubbing     SKIN: warm and dry without lesions    NEURO:  alert, approp, nl sensorium with  no motor or cerebellar deficits apparent.         Rec cxr > did not go as req 03/05/2022     Assessment

## 2022-03-05 NOTE — Progress Notes (Unsigned)
Atc patient to schedule 3 month follow up and received call could not be completed at this time message

## 2022-03-05 NOTE — Patient Instructions (Addendum)
Plan A = Automatic = Always=    Breztri Take 2 puffs first thing in am and then another 2 puffs about 12 hours later.     Plan B = Backup (to supplement plan A, not to replace it) Only use your albuterol inhaler as a rescue medication to be used if you can't catch your breath by resting or doing a relaxed purse lip breathing pattern.  - The less you use it, the better it will work when you need it. - Ok to use the inhaler up to 2 puffs  every 4 hours if you must but call for appointment if use goes up over your usual need - Don't leave home without it !!  (think of it like the spare tire for your car)   Plan C = Crisis (instead of Plan B but only if Plan B stops working) - only use your albuterol nebulizer if you first try Plan B and it fails to help > ok to use the nebulizer up to every 4 hours but if start needing it regularly call for immediate appointment   Plan D = Deltasone = prednisone  - if ABC not correcting the problem > Prednisone 10 mg take  4 each am x 2 days,   2 each am x 2 days,  1 each am x 2 days and stop   Please remember to go to the  x-ray department  @  [facility] for your tests - we will call you with the results when they are available     My office will be contacting you by phone for referral to lung cancer screen for annual testing  - if you don't hear back from my office within one week please call us back or notify us thru MyChart and we'll address it right away.   Add Also needs alpha one testing on return / did not go for cxr

## 2022-03-05 NOTE — Assessment & Plan Note (Signed)
Low-dose CT lung cancer screening is recommended for patients who are 4-64 years of age with a 20+ pack-year history of smoking and who are currently smoking or quit <=15 years ago. No coughing up blood  No unintentional weight loss of > 15 pounds in the last 6 months - pt is eligible for scanning yearly until 15 year p quit smoking

## 2022-03-06 ENCOUNTER — Telehealth: Payer: Self-pay | Admitting: Internal Medicine

## 2022-03-06 ENCOUNTER — Encounter: Payer: Self-pay | Admitting: Internal Medicine

## 2022-03-06 NOTE — Assessment & Plan Note (Signed)
See CTa 1/10/2 x 3 noudles largest 6 mm lower lobes> rec f/u CT at 6 m - CT super D done 10/02/21 > no change > referred for LDSCT yearly  - CXR at UC reported to show R lung atx ? 02/28/22 Rx doxy > rec f/u cxr not done - 03/05/2022 placed in system for annual LDSCT   Stongly doubt he has significant atx with neg CT 5 months ago and declined to go for cxr today as rec so will call to remind him of need to f/u the abn findings at [facility] and in meantime he is eligible for annual LDSCT > referred

## 2022-03-06 NOTE — Telephone Encounter (Signed)
ATC patient to ask about CXR and schedule 3 month fu and received "call could not be completed at this time message" X2.

## 2022-03-06 NOTE — Assessment & Plan Note (Addendum)
Quit smoking 03/2021 @ 160 - PFT's by [facility] around 2017 /18 reported "stage 3" - 10/01/2019  After extensive coaching inhaler device,  effectiveness =    90% with elipta >> continue trelegy   - PFT's  11/24/19   FEV1 1.51 (42 % ) ratio 0.35  p 1 % improvement from saba p "inhaler" 6 h prior to study with DLCO  12.75 (46%) corrects to 2.11 (50%)  for alv volume and FV curve classic concave exp flow vol  - alpha one AT screen   01/04/2020 >>> not done  - 03/06/2021  After extensive coaching inhaler device,  effectiveness =    75% continue trelegy and try off acei due to pseudowheeze on exam - 04/27/2021  After extensive coaching inhaler device,  effectiveness =    75% with smi/respimat so try stioilto 2 each am as breaking thru trelegy with lots of upper airway sympoms > preferred trelegy  - 07/06/2021   Walked on RA  x  3  lap(s) =  approx 450  ft  @ mod pace, stopped due to end of study with lowest 02 sats 94% and sob on 2nd lap   - 07/06/2021 started pred x 6 days as PLAN D  - 11/14/2021   Walked on RA  x  3  lap(s) =  approx 450  ft  @ fast pace, stopped due to end of study s sob  with lowest 02 sats 93%   - 03/05/2022   Walked on RA  x  3  lap(s) =  approx 450  ft  @ fast pace, stopped due to end of study  with lowest 02 sats 94% -  03/05/2022  After extensive coaching inhaler device,  effectiveness =    75% with hfa so try breztri in place of trelegy and continue plan D = prednisone x 6 d   Having freq flares on trelegy  and clearly  Group D (now reclassified as E) in terms of symptom/risk and laba/lama/ICS  therefore appropriate rx at this point >>>  try breztri and approp saba   Also needs alpha one testing on return

## 2022-03-06 NOTE — Telephone Encounter (Signed)
Did not go for cxr as rec, please have him return if at all possible

## 2022-03-21 ENCOUNTER — Telehealth: Payer: Self-pay | Admitting: Orthopaedic Surgery

## 2022-03-28 IMAGING — CT CT ANGIO CHEST
2 of 6 series · 18 of 36 positions shown · IV contrast (Omnipaque or Isovue)
Comparison: CT chest 01/06/2014

CLINICAL DATA: Shortness of breath progressively worsening since
getting the flu 2 weeks ago, history COPD, high clinical suspicion
of pulmonary embolism

EXAM:
CT ANGIOGRAPHY CHEST WITH CONTRAST
TECHNIQUE: Multidetector CT imaging of the chest was performed using the
standard protocol during bolus administration of intravenous
contrast. Multiplanar CT image reconstructions and MIPs were
obtained to evaluate the vascular anatomy.
CONTRAST:  75mL OMNIPAQUE IOHEXOL 350 MG/ML SOLN IV

[Series 6: pe axial thins · axial · 0.70mm/px · z∈[+1244,+1556]mm · 17 of 432 slices shown]
[im 21/432  lung]
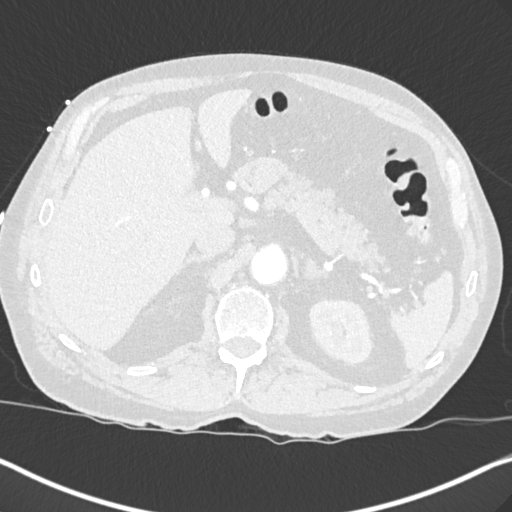
[im 42/432  mediastinal]
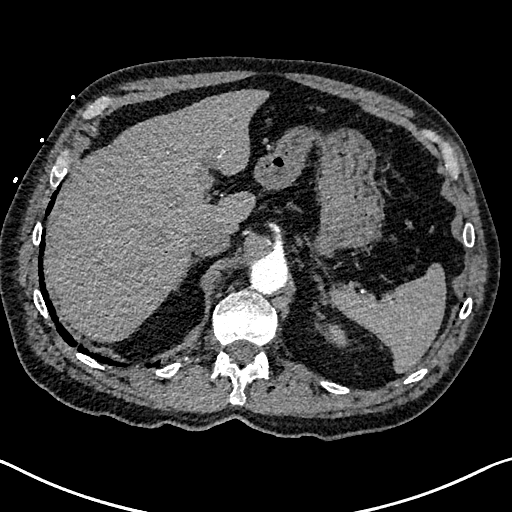
[im 62/432  lung]
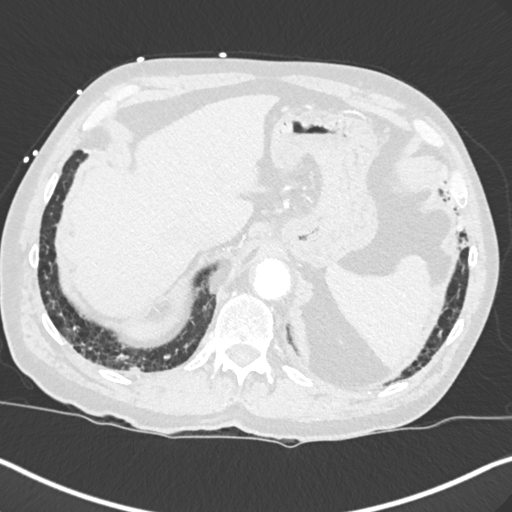
[im 103/432  mediastinal]
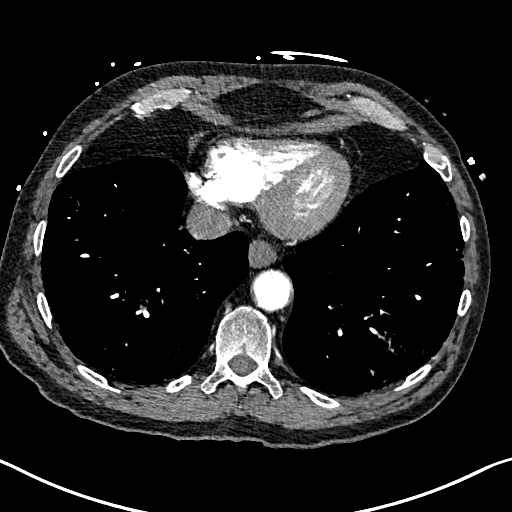
[im 124/432  lung]
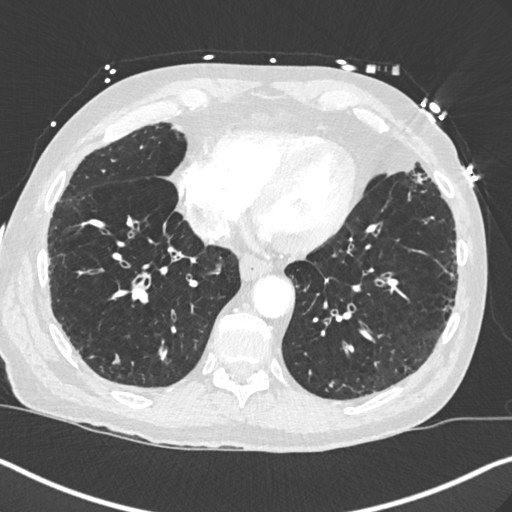
[im 144/432  mediastinal]
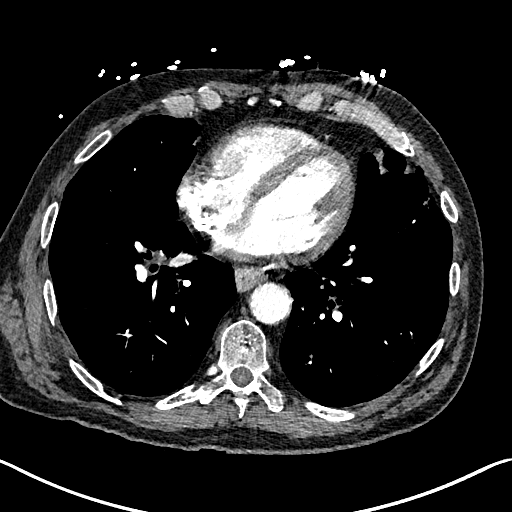
[im 165/432  lung]
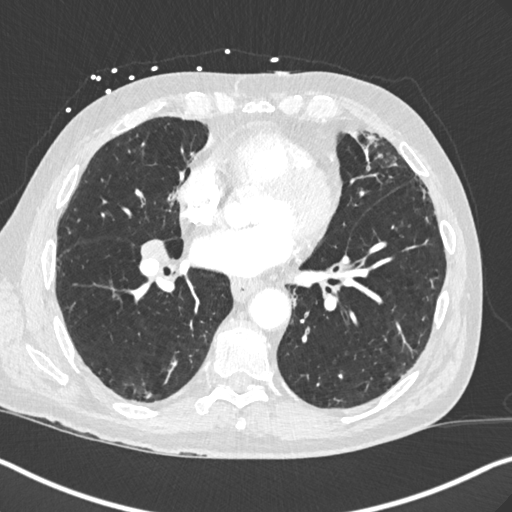
[im 185/432  mediastinal]
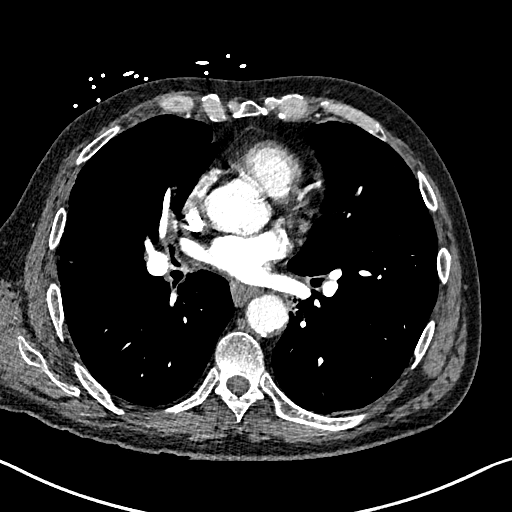
[im 226/432  lung]
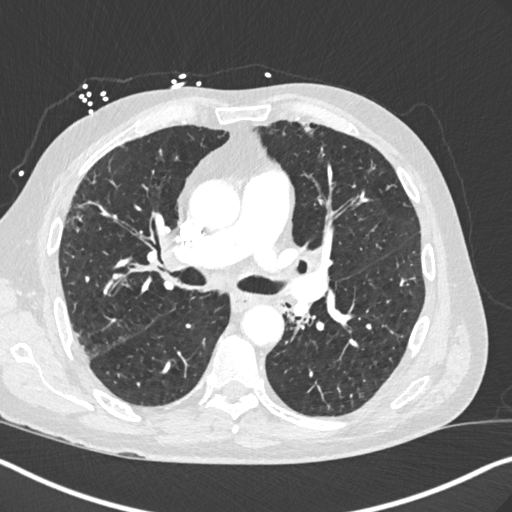
[im 247/432  mediastinal]
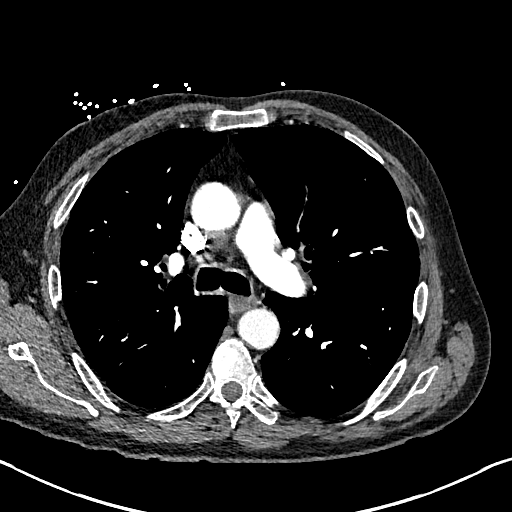
[im 267/432  lung]
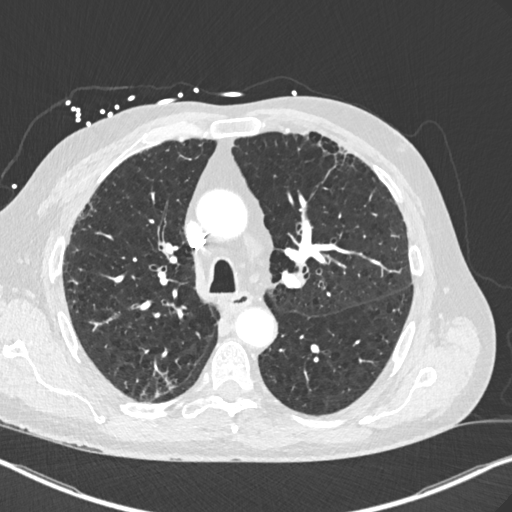
[im 288/432  mediastinal]
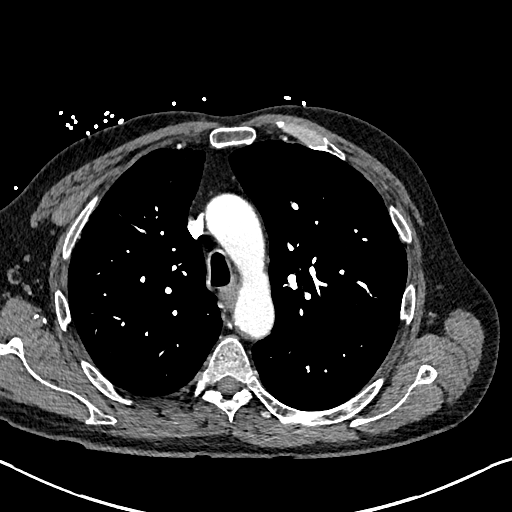
[im 308/432  lung]
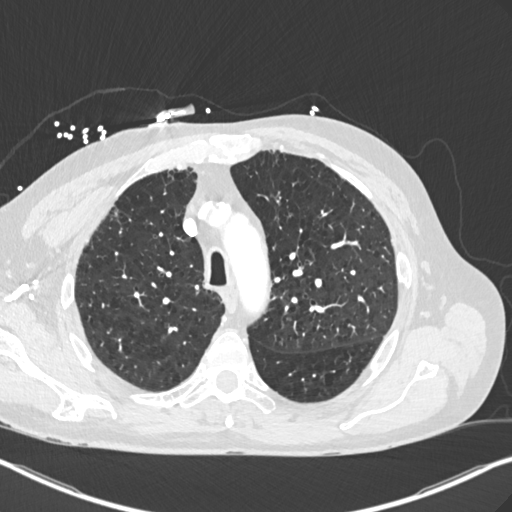
[im 329/432  mediastinal]
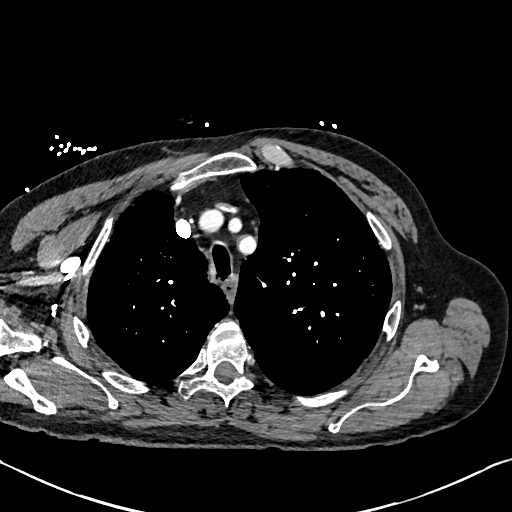
[im 370/432  lung]
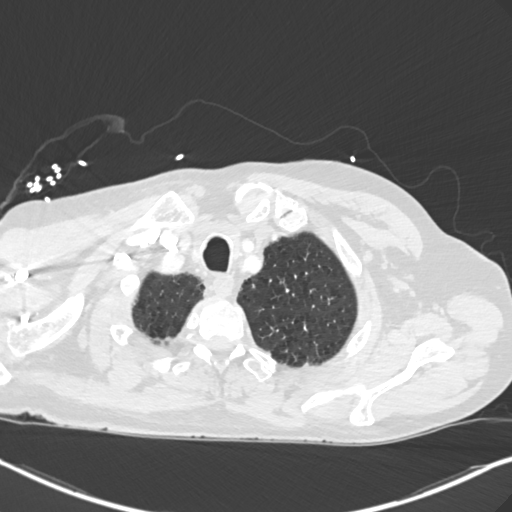
[im 390/432  mediastinal]
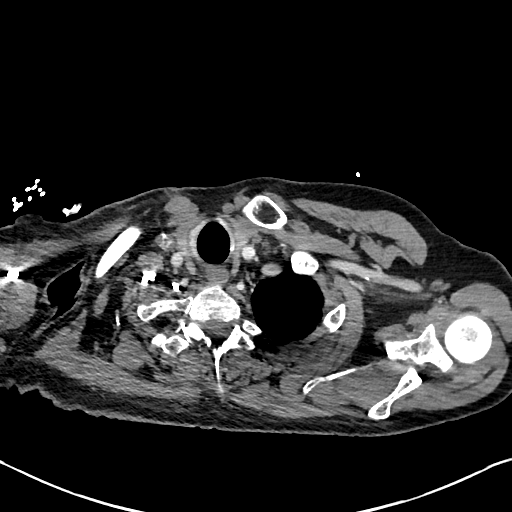
[im 411/432  lung]
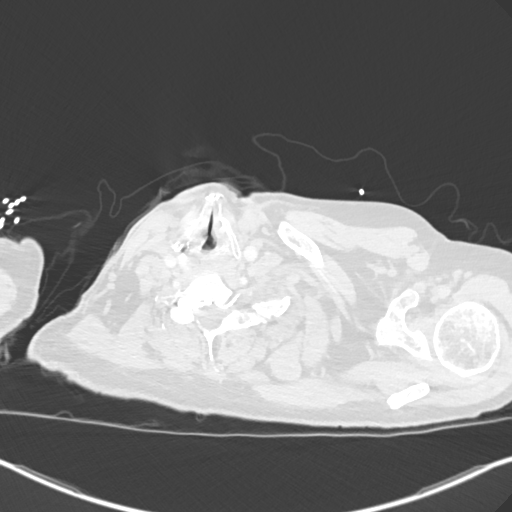

[Series 8: cor soft · coronal · 0.73mm/px · 1 of 151 slices shown]
[im 76/151  mediastinal]
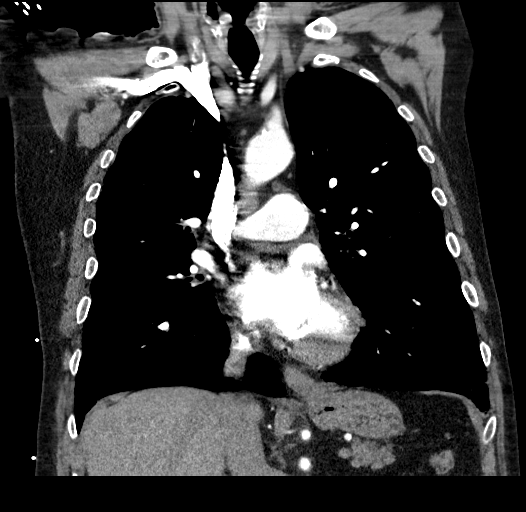

[18 of 36 positions shown; findings below may reference images not displayed]

FINDINGS: Cardiovascular: Atherosclerotic calcifications aorta, proximal great
vessels, and coronary arteries. Aorta normal caliber without
aneurysm or dissection. Heart size normal. No pericardial effusion.
Pulmonary arteries adequately opacified and patent. No evidence of
pulmonary embolism.

Mediastinum/Nodes: Base of cervical region normal appearance.
Esophagus unremarkable. Enlarged RIGHT hilar lymph nodes, 13 mm
image 70 and 12 mm image 57, unchanged. Additional scattered normal
sized mediastinal and axillary nodes.

Lungs/Pleura: Emphysematous changes and central peribronchial
thickening consistent with COPD. Chronic interstitial disease RIGHT
lung. Progressive interstitial infiltrates in the lower lobes
bilaterally. Questionable nodular density versus interstitial
disease 6 mm image 139 and 5 mm image 140. 4 mm RIGHT lower lobe
density image 112. No definite acute infiltrate, pleural effusion,
or pneumothorax.

Upper Abdomen: Visualized upper abdomen unremarkable

Musculoskeletal: No acute osseous findings.

Review of the MIP images confirms the above findings.
IMPRESSION: No evidence of pulmonary embolism.

COPD changes with progressive chronic interstitial lung disease
changes/fibrosis peripherally, greatest at lung bases.

Three nodular foci are identified in the lower lobes largest 6 mm
diameter; Non-contrast chest CT at 3-6 months is recommended. If the
nodules are stable at time of repeat CT, then future CT at 18-24
months (from today's scan) is considered optional for low-risk
patients, but is recommended for high-risk patients. This
recommendation follows the consensus statement: Guidelines for
Management of Incidental Pulmonary Nodules Detected on CT Images:

Stable mildly enlarged RIGHT hilar lymph nodes.

Aortic Atherosclerosis (Y21JC-I74.4) and Emphysema (Y21JC-IJ6.9).

## 2022-03-28 IMAGING — DX DG CHEST 2V
2 series · 2 of 2 positions shown · non-contrast
Comparison: 01/06/2014.

CLINICAL DATA: SOB

EXAM:
CHEST - 2 VIEW

[chest pa]
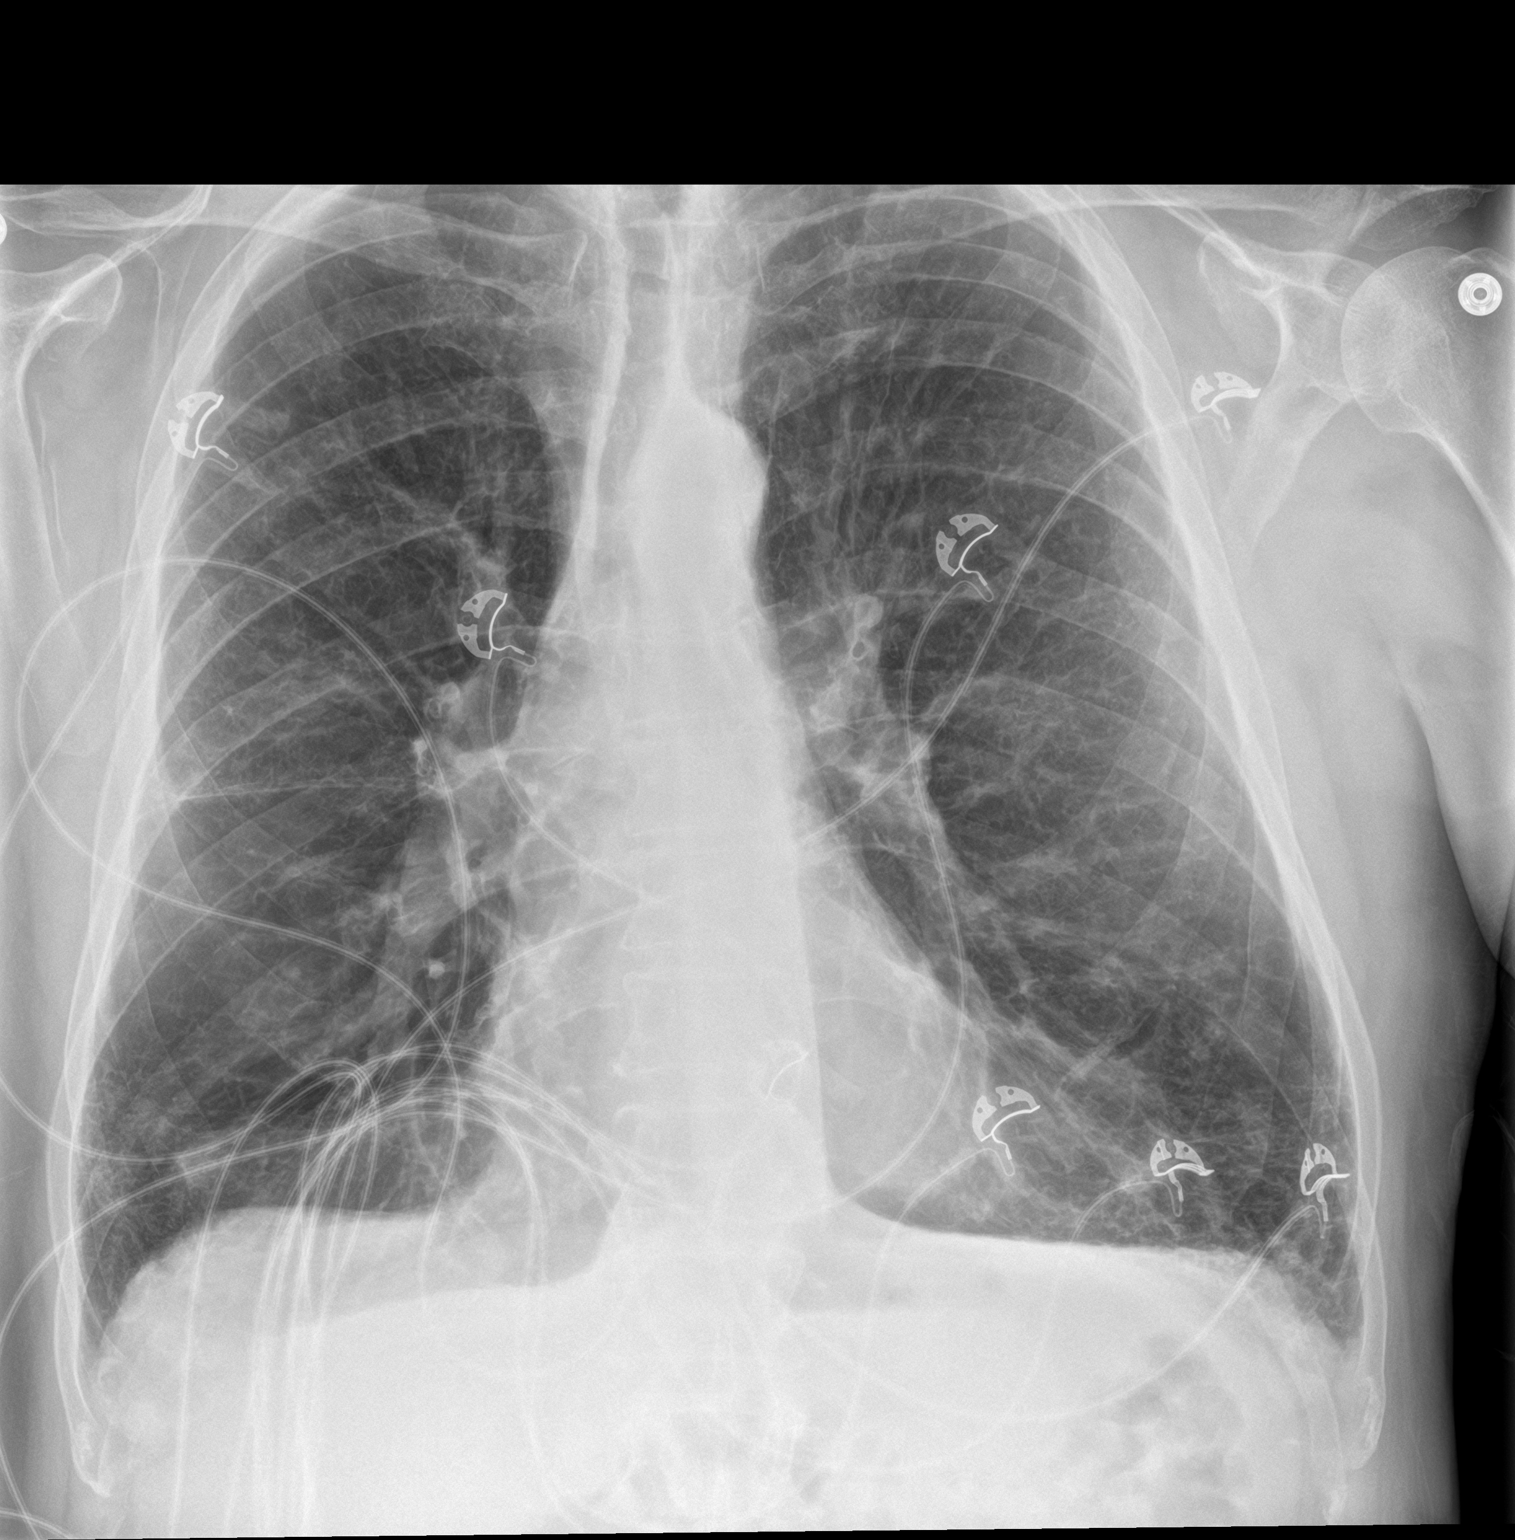

[chest lat]
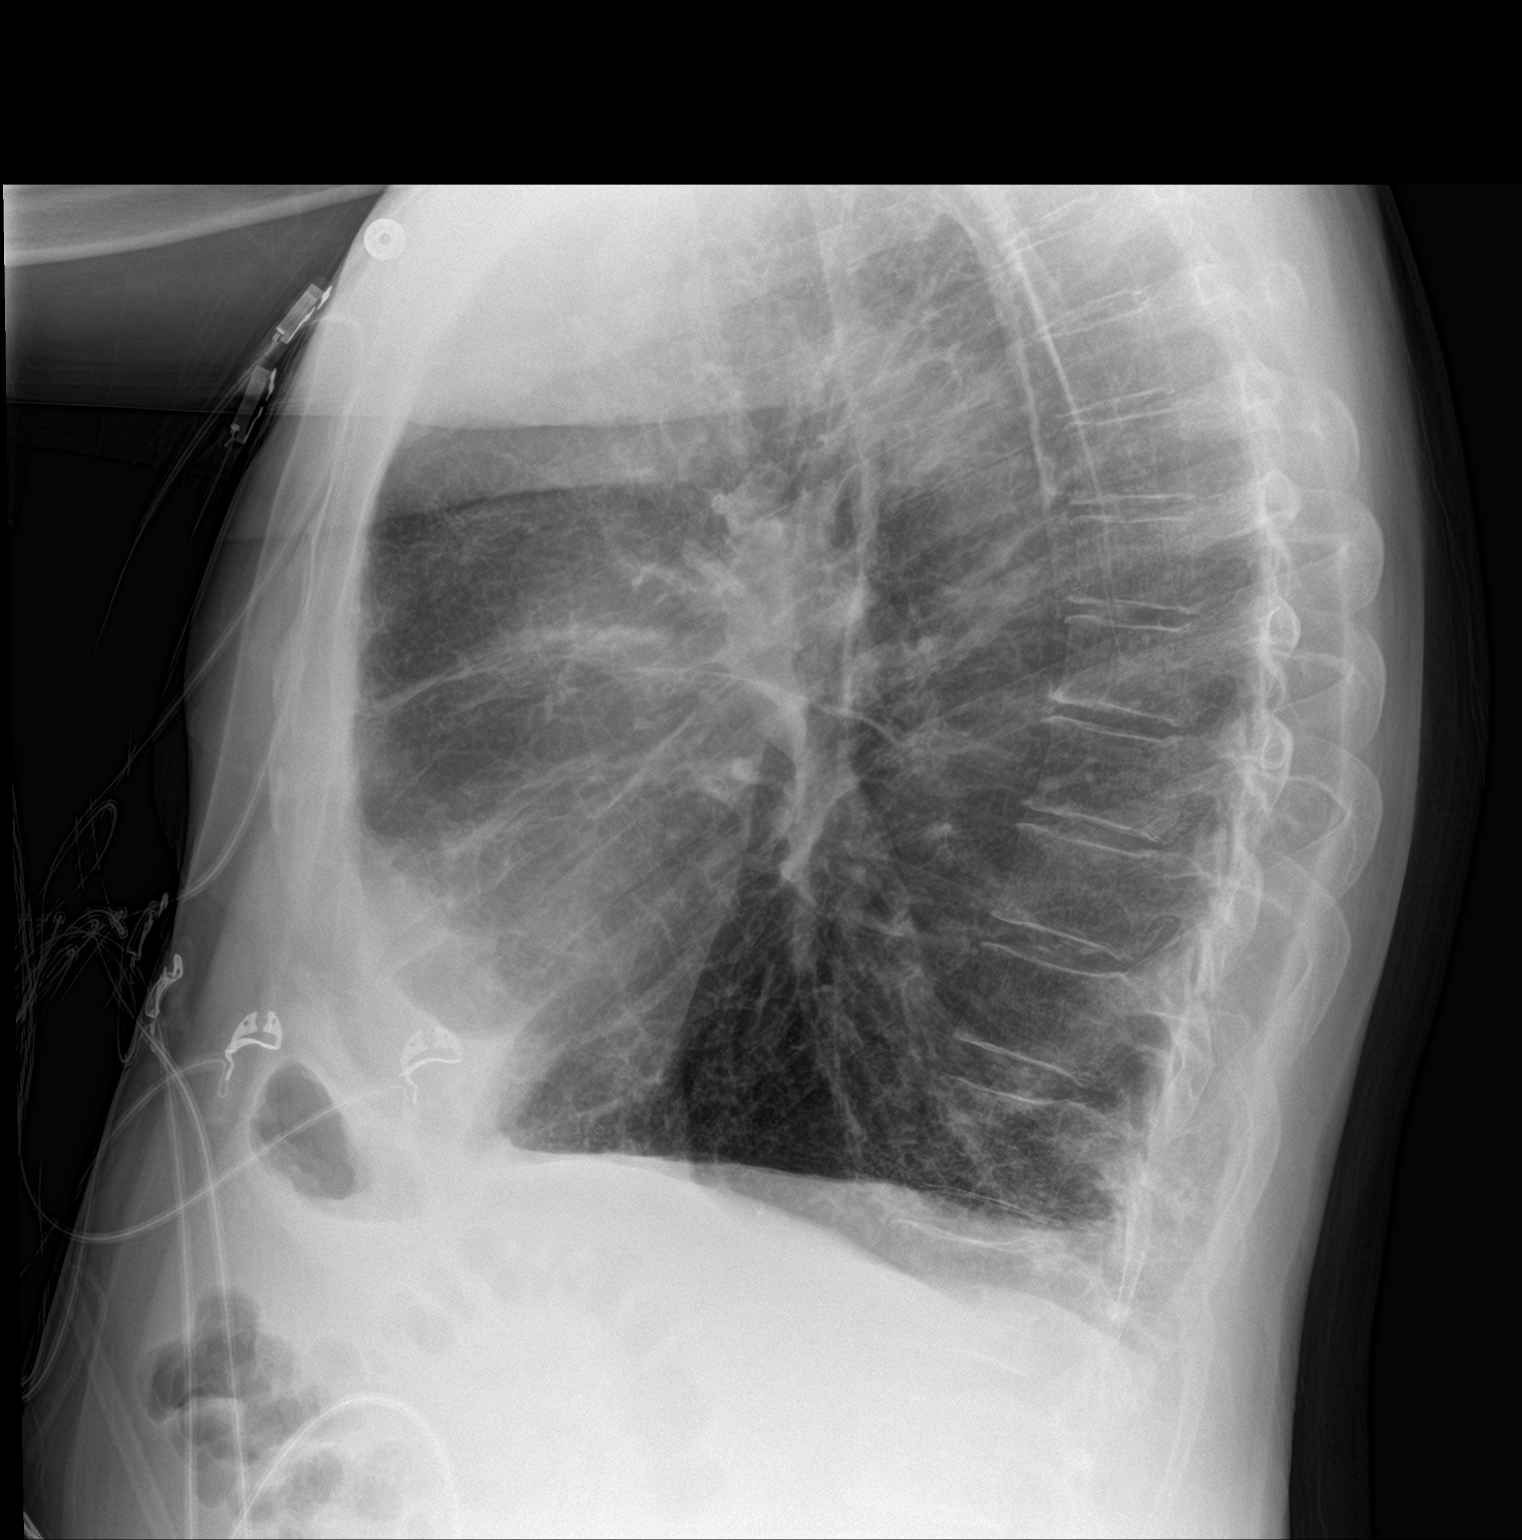

[2 of 2 positions shown; findings below may reference images not displayed]

FINDINGS: 10 mm right basilar nodular opacity. Scattered interstitial
opacities throughout the lungs, most conspicuous at the left lung
base. Hyperinflation. No definite pleural effusions or pneumothorax.
Cardiomediastinal silhouette is within normal limits.
IMPRESSION: 1. 10 mm right basilar nodular opacity. Recommend CT of the chest to
further evaluate.
2. Scattered interstitial opacities throughout the lungs, most
conspicuous at the left lung base. Findings are likely at least in
part secondary to chronic scarring, although superimposed infection
is not excluded.
3. Hyperinflation.

## 2022-04-20 ENCOUNTER — Telehealth: Payer: Self-pay | Admitting: Orthopaedic Surgery

## 2022-04-24 ENCOUNTER — Encounter: Payer: Self-pay | Admitting: Orthopaedic Surgery

## 2022-04-24 ENCOUNTER — Ambulatory Visit (INDEPENDENT_AMBULATORY_CARE_PROVIDER_SITE_OTHER): Payer: Medicaid Other | Admitting: Orthopaedic Surgery

## 2022-04-24 DIAGNOSIS — M25511 Pain in right shoulder: Secondary | ICD-10-CM

## 2022-04-24 DIAGNOSIS — G8929 Other chronic pain: Secondary | ICD-10-CM

## 2022-04-24 DIAGNOSIS — F17210 Nicotine dependence, cigarettes, uncomplicated: Secondary | ICD-10-CM

## 2022-04-24 MED ORDER — METHYLPREDNISOLONE ACETATE 40 MG/ML IJ SUSP
40.0000 mg | Freq: Once | INTRAMUSCULAR | Status: AC
  Administered 2022-04-24: 40 mg via INTRA_ARTICULAR

## 2022-04-24 NOTE — Progress Notes (Signed)
PROCEDURE NOTE:  The patient request injection, verbal consent was obtained.  The right shoulder was prepped appropriately after time out was performed.   Sterile technique was observed and injection of 1 cc of DepoMedrol '40mg'$  with several cc's of plain xylocaine. Anesthesia was provided by ethyl chloride and a 20-gauge needle was used to inject the shoulder area. A posterior approach was used.  The injection was tolerated well.  A band aid dressing was applied.  The patient was advised to apply ice later today and tomorrow to the injection sight as needed.   Encounter Diagnoses  Name Primary?   Chronic right shoulder pain Yes   Nicotine dependence, cigarettes, uncomplicated    Return in two months.  Call if any problem.  Precautions discussed.  Electronically Signed Sanjuana Kava, MD 1/9/20248:50 AM

## 2022-04-24 NOTE — Addendum Note (Signed)
Addended by: Obie Dredge A on: 04/24/2022 08:56 AM   Modules accepted: Orders

## 2022-05-01 ENCOUNTER — Telehealth: Payer: Self-pay | Admitting: *Deleted

## 2022-05-01 MED ORDER — LOSARTAN POTASSIUM 50 MG PO TABS: 50.0000 mg | ORAL_TABLET | Freq: Every day | ORAL | 11 refills | Status: DC

## 2022-05-01 NOTE — Telephone Encounter (Signed)
Refill sent. Nothing further needed

## 2022-05-01 NOTE — Telephone Encounter (Signed)
OV notes from 2023 faxed to [facility]. Added on fax sheet number for medical records and that if they needed any further records medical rec would need to be contacted nothing further needed.

## 2022-05-23 ENCOUNTER — Telehealth: Payer: Self-pay | Admitting: Orthopaedic Surgery

## 2022-05-23 ENCOUNTER — Ambulatory Visit: Payer: 59 | Admitting: Orthopaedic Surgery

## 2022-06-14 ENCOUNTER — Ambulatory Visit: Payer: Medicare HMO | Admitting: Internal Medicine

## 2022-06-21 ENCOUNTER — Encounter: Payer: Self-pay | Admitting: Orthopaedic Surgery

## 2022-06-21 ENCOUNTER — Ambulatory Visit (INDEPENDENT_AMBULATORY_CARE_PROVIDER_SITE_OTHER): Payer: Medicaid Other | Admitting: Orthopaedic Surgery

## 2022-06-21 DIAGNOSIS — F17210 Nicotine dependence, cigarettes, uncomplicated: Secondary | ICD-10-CM

## 2022-06-21 DIAGNOSIS — M25511 Pain in right shoulder: Secondary | ICD-10-CM | POA: Diagnosis not present

## 2022-06-21 DIAGNOSIS — G8929 Other chronic pain: Secondary | ICD-10-CM

## 2022-06-21 MED ORDER — OXYCODONE-ACETAMINOPHEN 5-325 MG PO TABS: 1.0000 | ORAL_TABLET | Freq: Four times a day (QID) | ORAL | 0 refills | Status: DC | PRN

## 2022-06-21 NOTE — Progress Notes (Signed)
PROCEDURE NOTE:  The patient request injection, verbal consent was obtained.  The right shoulder was prepped appropriately after time out was performed.   Sterile technique was observed and injection of 1 cc of DepoMedrol '40mg'$  with several cc's of plain xylocaine. Anesthesia was provided by ethyl chloride and a 20-gauge needle was used to inject the shoulder area. A posterior approach was used.  The injection was tolerated well.  A band aid dressing was applied.  The patient was advised to apply ice later today and tomorrow to the injection sight as needed.  Encounter Diagnoses  Name Primary?   Chronic right shoulder pain Yes   Nicotine dependence, cigarettes, uncomplicated    I have reviewed the [place] web site prior to prescribing narcotic medicine for this patient.  Return in two months.  Call if any problem.  Precautions discussed.  Electronically Signed Sanjuana Kava, MD 3/7/20248:50 AM

## 2022-07-10 NOTE — Progress Notes (Unsigned)
Adam Wheeler, male    DOB: Jan 06, 1956    MRN: [id number]   Brief patient profile:  22  yowm quit smoking 04/09/21 with new onset doe x 2017 eval by [name] and [name] dx copd and rx trelegy self referred to the pulmonary clinic in [place] 10/01/2019    History of Present Illness  10/01/2019  Pulmonary/ 1st office eval/Adam Wheeler  Chief Complaint  Patient presents with   Pulmonary Consult    Former pt of Dr [name]- COPD. He states today his breathing is "average"- needing samples of trelegy. He states this helps his SOB more than anything.   Dyspnea:  MMRC3 = can't walk 100 yards even at a slow pace at a flat grade s stopping due to sob  Difficulty with [employer] even going slowly and not checking sats/ worse since ran out of trelegy  Cough: none  Sleep: ok 30-45 degrees due to gerd not cough or sob  SABA use: once or twice a week rec Nexium 40 mg   Take  30-60 min before first meal of the day and Pepcid (famotidine)  20 mg one after supper or before bedtime  until return to office - this is the best way to tell whether stomach acid is contributing to your problem.   GERD  No change in trelegy for now - be sure to brush teeth and gargle with arm and hammer toothpaste Only use your albuterol as a rescue medication    03/05/2022  f/u ov/[place] office/Adam Wheeler re: GOLD 3  maint on trelegy / still off cigs  Chief Complaint  Patient presents with   Follow-up    Was seen at a UC in [place] and told his R lung collapsed partially and he was having a COPD flare up.   Dyspnea:  no longer working working out  Cough: yellow mucus rx doxy per UC  Sleeping: 45 degree bed  SABA use: not every day hfa/ twice a week neb  02: none  Covid status: vax max  Lung cancer screening: referred  Rec Plan A = Automatic = Always=    Breztri Take 2 puffs first thing in am and then another 2 puffs about 12 hours later.  Plan B = Backup (to supplement plan A, not to replace it) Only use your albuterol  inhaler as a rescue medication  Plan C = Crisis (instead of Plan B but only if Plan B stops working) - only use your albuterol nebulizer if you first try Plan B  Plan D = Deltasone = prednisone  - if ABC not correcting the problem > Prednisone 10 mg take  4 each am x 2 days,   2 each am x 2 days,  1 each am x 2 days and stop       07/11/2022  f/u ov/ office/Adam Wheeler re: GOLD 3  maint on trelegy/beztri/ acei   Chief Complaint  Patient presents with   Follow-up    Feels breztri is not helping much with his congestion  Dyspnea:  working out at [employer]  Cough: worse p supper and before bed s excess mucus/ pos globus sensation  Sleeping: 45 degree bed  SABA use: rarely  Lung cancer screening in program due q June    No obvious day to day or daytime variability or assoc excess/ purulent sputum or mucus plugs or hemoptysis or cp or chest tightness, subjective wheeze or overt sinus or hb symptoms.   sleeping without nocturnal  or early am exacerbation  of  respiratory  c/o's or need for noct saba. Also denies any obvious fluctuation of symptoms with weather or environmental changes or other aggravating or alleviating factors except as outlined above   No unusual exposure hx or h/o childhood pna/ asthma or knowledge of premature birth.  Current Allergies, Complete Past Medical History, Past Surgical History, Family History, and Social History were reviewed in [employer] record.  ROS  The following are not active complaints unless bolded Hoarseness, sore throat, dysphagia, dental problems, itching, sneezing,  nasal congestion or discharge of excess mucus or purulent secretions, ear ache,   fever, chills, sweats, unintended wt loss or wt gain, classically pleuritic or exertional cp,  orthopnea pnd or arm/hand swelling  or leg swelling, presyncope, palpitations, abdominal pain, anorexia, nausea, vomiting, diarrhea  or change in bowel habits or change in bladder habits, change  in stools or change in urine, dysuria, hematuria,  rash, arthralgias, visual complaints, headache, numbness, weakness or ataxia or problems with walking or coordination,  change in mood or  memory.        Current Meds  Medication Sig   albuterol (PROVENTIL) (2.5 MG/3ML) 0.083% nebulizer solution Take 3 mLs (2.5 mg total) by nebulization every 6 (six) hours as needed for wheezing or shortness of breath.   albuterol (VENTOLIN HFA) 108 (90 Base) MCG/ACT inhaler Inhale 2 puffs into the lungs every 6 (six) hours as needed for wheezing or shortness of breath.   ALPRAZolam (XANAX) 1 MG tablet Take 1 mg by mouth 2 (two) times daily as needed for anxiety.   ARIPiprazole (ABILIFY) 10 MG tablet Take 10 mg by mouth every morning.   Budeson-Glycopyrrol-Formoterol (BREZTRI AEROSPHERE) 160-9-4.8 MCG/ACT AERO Inhale 2 puffs into the lungs 2 (two) times daily.   carbidopa-levodopa (SINEMET IR) 10-100 MG tablet Take by mouth.   diclofenac (VOLTAREN) 75 MG EC tablet Take 1 tablet (75 mg total) by mouth 2 (two) times daily with a meal.   dicyclomine (BENTYL) 10 MG capsule Take 1 capsule (10 mg total) by mouth 4 (four) times daily -  before meals and at bedtime. As needed for looser stool. Monitor for constipation, dry mouth, dizziness. (Patient taking differently: Take 10 mg by mouth 3 (three) times daily as needed for spasms. As needed for looser stool. Monitor for constipation, dry mouth, dizziness.)   famotidine (PEPCID) 20 MG tablet One after supper (Patient taking differently: Take 20 mg by mouth every evening. One after supper)   FLUoxetine (PROZAC) 40 MG capsule Take 40 mg by mouth every morning.   losartan (COZAAR) 50 MG tablet Take 1 tablet (50 mg total) by mouth daily.   NEXIUM 40 MG capsule Take 40 mg by mouth daily as needed (heart burn).   oxyCODONE-acetaminophen (PERCOCET/ROXICET) 5-325 MG tablet Take 1 tablet by mouth every 6 (six) hours as needed. for pain   [DISCONTINUED] lisinopril (ZESTRIL) 20 MG  tablet Take 20 mg by mouth daily.   [DISCONTINUED] TRELEGY ELLIPTA 100-62.5-25 MCG/ACT AEPB Take 1 puff by mouth daily.                Past Medical History:  Diagnosis Date   Ankle fracture, right 06/2012   Anxiety    Arthritis    Colon polyps    adenomatous polyps on multiple colonoscopies, starting in his early 13s   COPD (chronic obstructive pulmonary disease) (HCC)    Depression    History of DVT (deep vein thrombosis)    Hypertension    Kidney stones  Seizures ([place])    has seizures weekly   Sleep apnea    Stroke [facility]) 2005   post knee surgery in 2005         Objective:     Wts  07/11/2022       177 03/05/2022      170 01/18/2022       175  11/14/2021         178 07/06/2021       174  04/27/2021       164 03/06/2021     166   10/01/19 158 lb (71.7 kg)  06/25/19 165 lb (74.8 kg)  06/03/19 163 lb 2 oz (74 kg)     Vital signs reviewed  07/11/2022  - Note at rest 02 sats  96% on RA   General appearance:    amb wm nad    HEENT : Oropharynx  clear      NECK :  without  apparent JVD/ palpable Nodes/TM    LUNGS: no acc muscle use,  Mild barrel  contour chest wall with bilateral  Distant bs s audible wheeze and  without cough on insp or exp maneuvers  and mild  Hyperresonant  to  percussion bilaterally     CV:  RRR  no s3 or murmur or increase in P2, and no edema   ABD:  soft and nontender with pos end  insp Hoover's  in the supine position.  No bruits or organomegaly appreciated   MS:  Nl gait/ ext warm without deformities Or obvious joint restrictions  calf tenderness, cyanosis or clubbing     SKIN: warm and dry without lesions    NEURO:  alert, approp, nl sensorium with  no motor or cerebellar deficits apparent.      Assessment

## 2022-07-11 ENCOUNTER — Ambulatory Visit (INDEPENDENT_AMBULATORY_CARE_PROVIDER_SITE_OTHER): Payer: Medicaid Other | Admitting: Internal Medicine

## 2022-07-11 ENCOUNTER — Encounter: Payer: Self-pay | Admitting: Internal Medicine

## 2022-07-11 VITALS — BP 138/90 | HR 62 | Ht 70.0 in | Wt 177.8 lb

## 2022-07-11 DIAGNOSIS — I10 Essential (primary) hypertension: Secondary | ICD-10-CM | POA: Diagnosis not present

## 2022-07-11 DIAGNOSIS — J449 Chronic obstructive pulmonary disease, unspecified: Secondary | ICD-10-CM

## 2022-07-11 MED ORDER — IRBESARTAN 150 MG PO TABS: 150.0000 mg | ORAL_TABLET | Freq: Every day | ORAL | 11 refills | Status: DC

## 2022-07-11 NOTE — Patient Instructions (Addendum)
Please remember to go to the lab department   for your tests - we will call you with the results when they are available.     Stop trelegy and losartan and lisinopril  Add Avapro 150 mg one daily (ibesartan)  Work on inhaler technique:  relax and gently blow all the way out then take a nice smooth full deep breath back in, triggering the inhaler at same time you start breathing in.  Hold breath in for at least  5 seconds if you can. Blow out breztri thru nose. Rinse and gargle with water when done.  If mouth or throat bother you at all,  try brushing teeth/gums/tongue with arm and hammer toothpaste/ make a slurry and gargle and spit out.  - rememeber how golfers warm up by taking practice breaths off of empty breztri container       Please schedule a follow up visit in 3 months but call sooner if needed  with all medications /inhalers/ solutions in hand so we can verify exactly what you are taking. This includes all medications from all doctors and over the counters

## 2022-07-12 NOTE — Assessment & Plan Note (Signed)
Quit smoking 03/2021 @ 160 - PFT's by [facility] around 2017 /18 reported "stage 3" - 10/01/2019  After extensive coaching inhaler device,  effectiveness =    90% with elipta >> continue trelegy   - PFT's  11/24/19   FEV1 1.51 (42 % ) ratio 0.35  p 1 % improvement from saba p "inhaler" 6 h prior to study with DLCO  12.75 (46%) corrects to 2.11 (50%)  for alv volume and FV curve classic concave exp flow vol  - alpha one AT screen   01/04/2020 >>> not done  - 03/06/2021  After extensive coaching inhaler device,  effectiveness =    75% continue trelegy and try off acei due to pseudowheeze on exam - 04/27/2021  After extensive coaching inhaler device,  effectiveness =    75% with smi/respimat so try stioilto 2 each am as breaking thru trelegy with lots of upper airway sympoms > preferred trelegy  - 07/06/2021   Walked on RA  x  3  lap(s) =  approx 450  ft  @ mod pace, stopped due to end of study with lowest 02 sats 94% and sob on 2nd lap   - 07/06/2021 started pred x 6 days as PLAN D  - 11/14/2021   Walked on RA  x  3  lap(s) =  approx 450  ft  @ fast pace, stopped due to end of study s sob  with lowest 02 sats 93%   - 03/05/2022   Walked on RA  x  3  lap(s) =  approx 450  ft  @ fast pace, stopped due to end of study  with lowest 02 sats 94% -  03/05/2022    try breztri in place of trelegy  - Labs ordered 07/11/2022  :  allergy screen Eos   alpha one AT phenotype     Group D (now reclassified as E) in terms of symptom/risk and laba/lama/ICS  therefore appropriate rx at this point >>>  breztri 2bid by itself should be adequate plus approp saba  - The proper method of use, as well as anticipated side effects, of a metered-dose inhaler were discussed and demonstrated to the patient using teach back method. Improved effectiveness after extensive coaching during this visit to a level of approximately 75%  %  with hfa

## 2022-07-12 NOTE — Assessment & Plan Note (Addendum)
D/c acei 03/06/2021 due to pseudowheeze > better 04/27/2021 > d/c'd again 07/11/2022   Now reports globus sensation   ACE inhibitors are problematic in  pts with airway complaints because  even experienced pulmonologists can't always distinguish ace effects from copd/asthma.  By themselves they don't actually cause a problem, much like oxygen can't by itself start a fire, but they certainly serve as a powerful catalyst or enhancer for any "fire"  or inflammatory process in the upper airway, be it caused by an ET  tube or more commonly reflux (especially in the obese or pts with known GERD or who are on biphoshonates).    In the era of ARB near equivalency until we have a better handle on the reversibility of the airway problem, it just makes sense to avoid ACEI  entirely in the short run and then decide later, having established a level of airway control using a reasonable limited regimen, whether to add back ace but even then being very careful to observe the pt for worsening airway control and number of meds used/ needed to control symptoms.    >>> try avapro 150 mg daily - can break in half if too strong and double if not strong enough as self monitors   Discussed in detail all the  indications, usual  risks and alternatives  relative to the benefits with patient who agrees to proceed with Rx as outlined.      F/u in 3 m with all meds in hand using a trust but verify approach to confirm accurate Medication  Reconciliation The principal here is that until we are certain that the  patients are doing what we've asked, it makes no sense to ask them to do more.      Each maintenance medication was reviewed in detail including emphasizing most importantly the difference between maintenance and prns and under what circumstances the prns are to be triggered using an action plan format where appropriate.  Total time for H and P, chart review, counseling, reviewing hfa  device(s) and generating customized  AVS unique to this office visit / same day charting = 33 min

## 2022-07-16 LAB — CBC WITH DIFFERENTIAL/PLATELET
Basophils Absolute: 0.1 10*3/uL (ref 0.0–0.2)
Basos: 1 %
EOS (ABSOLUTE): 0.2 10*3/uL (ref 0.0–0.4)
Eos: 3 %
Hematocrit: 41.7 % (ref 37.5–51.0)
Hemoglobin: 13.6 g/dL (ref 13.0–17.7)
Immature Grans (Abs): 0 10*3/uL (ref 0.0–0.1)
Immature Granulocytes: 0 %
Lymphocytes Absolute: 1.3 10*3/uL (ref 0.7–3.1)
Lymphs: 19 %
MCH: 30.3 pg (ref 26.6–33.0)
MCHC: 32.6 g/dL (ref 31.5–35.7)
MCV: 93 fL (ref 79–97)
Monocytes Absolute: 0.7 10*3/uL (ref 0.1–0.9)
Monocytes: 10 %
Neutrophils Absolute: 4.7 10*3/uL (ref 1.4–7.0)
Neutrophils: 67 %
Platelets: 279 10*3/uL (ref 150–450)
RBC: 4.49 x10E6/uL (ref 4.14–5.80)
RDW: 13.8 % (ref 11.6–15.4)
WBC: 7 10*3/uL (ref 3.4–10.8)

## 2022-07-16 LAB — ALPHA-1-ANTITRYPSIN PHENOTYP: A-1 Antitrypsin: 134 mg/dL (ref 101–187)

## 2022-07-18 NOTE — Progress Notes (Signed)
ATC x1.  VM not set up.

## 2022-07-19 ENCOUNTER — Telehealth: Payer: Self-pay | Admitting: Orthopaedic Surgery

## 2022-08-08 ENCOUNTER — Telehealth: Payer: Self-pay | Admitting: Internal Medicine

## 2022-08-08 NOTE — Telephone Encounter (Signed)
Pt. Calling saying Budeson-Glycopyrrol-Formoterol (BREZTRI AEROSPHERE) is not working for him and has gone back to using the trelgy back now 2weeks and want a new script called into pharmacy

## 2022-08-08 NOTE — Telephone Encounter (Signed)
Fine with me - trelegy 100 one click each am  

## 2022-08-10 MED ORDER — TRELEGY ELLIPTA 100-62.5-25 MCG/ACT IN AEPB: 1.0000 | INHALATION_SPRAY | Freq: Every day | RESPIRATORY_TRACT | 1 refills | Status: DC

## 2022-08-10 NOTE — Telephone Encounter (Signed)
ATC pt unable to reach called wife (ok per DPR) - she verbalized understanding of MW's message. Refill of Trelegy 100 sent to preferred pharmacy. NFN at time of call

## 2022-08-21 ENCOUNTER — Ambulatory Visit (INDEPENDENT_AMBULATORY_CARE_PROVIDER_SITE_OTHER): Payer: Self-pay | Admitting: Orthopaedic Surgery

## 2022-08-21 ENCOUNTER — Encounter: Payer: Self-pay | Admitting: Orthopaedic Surgery

## 2022-08-21 DIAGNOSIS — M25511 Pain in right shoulder: Secondary | ICD-10-CM

## 2022-08-21 DIAGNOSIS — F17210 Nicotine dependence, cigarettes, uncomplicated: Secondary | ICD-10-CM

## 2022-08-21 DIAGNOSIS — G8929 Other chronic pain: Secondary | ICD-10-CM

## 2022-08-21 MED ORDER — OXYCODONE-ACETAMINOPHEN 5-325 MG PO TABS: 1.0000 | ORAL_TABLET | Freq: Four times a day (QID) | ORAL | 0 refills | Status: DC | PRN

## 2022-08-21 NOTE — Progress Notes (Signed)
PROCEDURE NOTE:  The patient request injection, verbal consent was obtained.  The right shoulder was prepped appropriately after time out was performed.   Sterile technique was observed and injection of 1 cc of DepoMedrol 40mg  with several cc's of plain xylocaine. Anesthesia was provided by ethyl chloride and a 20-gauge needle was used to inject the shoulder area. A posterior approach was used.  The injection was tolerated well.  A band aid dressing was applied.  The patient was advised to apply ice later today and tomorrow to the injection sight as needed.  Encounter Diagnoses  Name Primary?   Chronic right shoulder pain Yes   Nicotine dependence, cigarettes, uncomplicated    I have reviewed the [place] Controlled Substance Reporting System web site prior to prescribing narcotic medicine for this patient.  Call if any problem.  Precautions discussed.  Electronically Signed Darreld Mclean, MD 5/7/20248:34 AM

## 2022-09-17 ENCOUNTER — Telehealth: Payer: Self-pay | Admitting: Orthopaedic Surgery

## 2022-10-11 ENCOUNTER — Ambulatory Visit: Payer: Medicare HMO | Admitting: Internal Medicine

## 2022-10-23 ENCOUNTER — Encounter: Payer: Self-pay | Admitting: Orthopaedic Surgery

## 2022-10-23 ENCOUNTER — Ambulatory Visit (INDEPENDENT_AMBULATORY_CARE_PROVIDER_SITE_OTHER): Payer: Medicare Other | Admitting: Orthopaedic Surgery

## 2022-10-23 DIAGNOSIS — G8929 Other chronic pain: Secondary | ICD-10-CM | POA: Diagnosis not present

## 2022-10-23 DIAGNOSIS — M25511 Pain in right shoulder: Secondary | ICD-10-CM | POA: Diagnosis not present

## 2022-10-23 DIAGNOSIS — F17210 Nicotine dependence, cigarettes, uncomplicated: Secondary | ICD-10-CM

## 2022-10-23 MED ORDER — OXYCODONE-ACETAMINOPHEN 5-325 MG PO TABS: 1.0000 | ORAL_TABLET | Freq: Four times a day (QID) | ORAL | 0 refills | Status: DC | PRN

## 2022-10-23 NOTE — Progress Notes (Signed)
PROCEDURE NOTE:  The patient request injection, verbal consent was obtained.  The right shoulder was prepped appropriately after time out was performed.   Sterile technique was observed and injection of 1 cc of DepoMedrol 40mg  with several cc's of plain xylocaine. Anesthesia was provided by ethyl chloride and a 20-gauge needle was used to inject the shoulder area. A posterior approach was used.  The injection was tolerated well.  A band aid dressing was applied.  The patient was advised to apply ice later today and tomorrow to the injection sight as needed.  Encounter Diagnoses  Name Primary?   Chronic right shoulder pain Yes   Nicotine dependence, cigarettes, uncomplicated    I have reviewed the [place] Controlled Substance Reporting System web site prior to prescribing narcotic medicine for this patient.  Return prn.  Call if any problem.  Precautions discussed.  Electronically Signed Darreld Mclean, MD 7/9/20249:12 AM

## 2022-10-27 NOTE — Progress Notes (Signed)
Adam Wheeler, male    DOB: [birth year]    MRN: [id number]   Brief patient profile:  67  yowm quit smoking 04/09/21 with new onset doe x 2017 eval by Adam Wheeler and Adam Wheeler dx copd and rx trelegy self referred to the pulmonary clinic in [place] 10/01/2019    History of Present Illness  10/01/2019  Pulmonary/ 1st office eval/Adam Wheeler  Chief Complaint  Patient presents with   Pulmonary Consult    Former pt of Dr Adam Wheeler- COPD. He states today his breathing is "average"- needing samples of trelegy. He states this helps his SOB more than anything.   Dyspnea:  MMRC3 = can't walk 100 yards even at a slow pace at a flat grade s stopping due to sob  Difficulty with [employer] even going slowly and not checking sats/ worse since ran out of trelegy  Cough: none  Sleep: ok 30-45 degrees due to gerd not cough or sob  SABA use: once or twice a week rec Nexium 40 mg   Take  30-60 min before first meal of the day and Pepcid (famotidine)  20 mg one after supper or before bedtime  until return to office - this is the best way to tell whether stomach acid is contributing to your problem.   GERD  No change in trelegy for now - be sure to brush teeth and gargle with arm and hammer toothpaste Only use your albuterol as a rescue medication    03/05/2022  f/u ov/Adam Wheeler office/Adam Wheeler re: GOLD 3  maint on trelegy / still off cigs  Chief Complaint  Patient presents with   Follow-up    Was seen at a UC in [place] and told his R lung collapsed partially and he was having a COPD flare up.   Dyspnea:  no longer working working out  Cough: yellow mucus rx doxy per UC  Sleeping: 45 degree bed  SABA use: not every day hfa/ twice a week neb  02: none  Covid status: vax max  Lung cancer screening: referred  Rec Plan A = Automatic = Always=    Breztri Take 2 puffs first thing in am and then another 2 puffs about 12 hours later.  Plan B = Backup (to supplement plan A, not to replace it) Only use your albuterol  inhaler as a rescue medication  Plan C = Crisis (instead of Plan B but only if Plan B stops working) - only use your albuterol nebulizer if you first try Plan B  Plan D = Deltasone = prednisone  - if ABC not correcting the problem > Prednisone 10 mg take  4 each am x 2 days,   2 each am x 2 days,  1 each am x 2 days and stop       07/11/2022  f/u ov/[name] office/Adam Wheeler re: GOLD 3  maint on trelegy/beztri/ acei   Chief Complaint  Patient presents with   Follow-up    Feels breztri is not helping much with his congestion  Dyspnea:  working out at [name]  Cough: worse p supper and before bed s excess mucus/ pos globus sensation  Sleeping: 45 degree bed  SABA use: rarely  Lung cancer screening in program due q June  Rec Please remember to go to the lab department   for your tests - we will call you with the results when they are available. Stop trelegy and losartan and lisinopril Add Avapro 150 mg one daily (ibesartan) Work on inhaler technique:  Please schedule a follow up visit in 3 months but call sooner if needed  with all medications /inhalers/ solutions in hand     10/29/2022  f/u ov/ office/Adam Wheeler re: GOLD 3  maint on trelegy 100 did not bring meds  Chief Complaint  Patient presents with   COPD  Dyspnea:  working out at [name] :  walking 10 min slow to avg pace  3 x  Cough: gone  Sleeping: 45 degree bed no resp cc  SABA use: rarely but always after ex/ never prior or rechallenging  02: none   Lung cancer screening: 10/29/2022 referred    No obvious day to day or daytime variability or assoc excess/ purulent sputum or mucus plugs or hemoptysis or cp or chest tightness, subjective wheeze or overt sinus or hb symptoms.   Sleeping  without nocturnal  or early am exacerbation  of respiratory  c/o's or need for noct saba. Also denies any obvious fluctuation of symptoms with weather or environmental changes or other aggravating or alleviating factors except as outlined above    No unusual exposure hx or h/o childhood pna/ asthma or knowledge of premature birth.  Current Allergies, Complete Past Medical History, Past Surgical History, Family History, and Social History were reviewed in [name] record.  ROS  The following are not active complaints unless bolded Hoarseness, sore throat, dysphagia, dental problems, itching, sneezing,  nasal congestion or discharge of excess mucus or purulent secretions, ear ache,   fever, chills, sweats, unintended wt loss or wt gain, classically pleuritic or exertional cp,  orthopnea pnd or arm/hand swelling  or leg swelling, presyncope, palpitations, abdominal pain, anorexia, nausea, vomiting, diarrhea  or change in bowel habits or change in bladder habits, change in stools or change in urine, dysuria, hematuria,  rash, arthralgias, visual complaints, headache, numbness, weakness or ataxia or problems with walking or coordination,  change in mood or  memory.        Current Meds  Medication Sig   albuterol (PROVENTIL) (2.5 MG/3ML) 0.083% nebulizer solution Take 3 mLs (2.5 mg total) by nebulization every 6 (six) hours as needed for wheezing or shortness of breath.   albuterol (VENTOLIN HFA) 108 (90 Base) MCG/ACT inhaler Inhale 2 puffs into the lungs every 6 (six) hours as needed for wheezing or shortness of breath.   ALPRAZolam (XANAX) 1 MG tablet Take 1 mg by mouth 2 (two) times daily as needed for anxiety.   ARIPiprazole (ABILIFY) 10 MG tablet Take 10 mg by mouth every morning.   carbidopa-levodopa (SINEMET IR) 10-100 MG tablet Take by mouth.   diclofenac (VOLTAREN) 75 MG EC tablet Take 1 tablet (75 mg total) by mouth 2 (two) times daily with a meal.   dicyclomine (BENTYL) 10 MG capsule Take 1 capsule (10 mg total) by mouth 4 (four) times daily -  before meals and at bedtime. As needed for looser stool. Monitor for constipation, dry mouth, dizziness. (Patient taking differently: Take 10 mg by mouth 3 (three)  times daily as needed for spasms. As needed for looser stool. Monitor for constipation, dry mouth, dizziness.)   famotidine (PEPCID) 20 MG tablet One after supper (Patient taking differently: Take 20 mg by mouth every evening. One after supper)   FLUoxetine (PROZAC) 40 MG capsule Take 40 mg by mouth every morning.   Fluticasone-Umeclidin-Vilant (TRELEGY ELLIPTA) 100-62.5-25 MCG/ACT AEPB Inhale 1 puff into the lungs daily.   lisinopril (ZESTRIL) 20 MG tablet Take 20 mg by mouth daily.  losartan (COZAAR) 50 MG tablet Take 50 mg by mouth daily.   NEXIUM 40 MG capsule Take 40 mg by mouth daily as needed (heart burn).                Past Medical History:  Diagnosis Date   Ankle fracture, right 06/2012   Anxiety    Arthritis    Colon polyps    adenomatous polyps on multiple colonoscopies, starting in his early 23s   COPD (chronic obstructive pulmonary disease) (HCC)    Depression    History of DVT (deep vein thrombosis)    Hypertension    Kidney stones    Seizures (HCC)    has seizures weekly   Sleep apnea    Stroke [facility]) 2005   post knee surgery in 2005         Objective:     Wts  10/29/2022       177  07/11/2022       177 03/05/2022      170 01/18/2022       175  11/14/2021         178 07/06/2021       174  04/27/2021       164 03/06/2021     166   10/01/19 158 lb (71.7 kg)  06/25/19 165 lb (74.8 kg)  06/03/19 163 lb 2 oz (74 kg)    Vital signs reviewed  10/29/2022  - Note at rest 02 sats  92% on RA   General appearance:    amb wm nad   HEENT : Oropharynx  clear/ no thrush      NECK :  without  apparent JVD/ palpable Nodes/TM    LUNGS: no acc muscle use,  Mild barrel  contour chest wall with bilateral  Distant mid exp wheeze and  without cough on insp or exp maneuvers  and mild  Hyperresonant  to  percussion bilaterally     CV:  RRR  no s3 or murmur or increase in P2, and no edema   ABD:  soft and nontender with pos end  insp Hoover's  in the supine position.   No bruits or organomegaly appreciated   MS:  Nl gait/ ext warm without deformities Or obvious joint restrictions  calf tenderness, cyanosis or clubbing     SKIN: warm and dry without lesions    NEURO:  alert, approp, nl sensorium with  no motor or cerebellar deficits apparent.      Assessment

## 2022-10-29 ENCOUNTER — Encounter: Payer: Self-pay | Admitting: Internal Medicine

## 2022-10-29 ENCOUNTER — Ambulatory Visit (INDEPENDENT_AMBULATORY_CARE_PROVIDER_SITE_OTHER): Payer: Medicare Other | Admitting: Internal Medicine

## 2022-10-29 VITALS — BP 144/87 | HR 55 | Ht 70.0 in | Wt 177.0 lb

## 2022-10-29 DIAGNOSIS — J449 Chronic obstructive pulmonary disease, unspecified: Secondary | ICD-10-CM | POA: Diagnosis not present

## 2022-10-29 DIAGNOSIS — I10 Essential (primary) hypertension: Secondary | ICD-10-CM

## 2022-10-29 DIAGNOSIS — R918 Other nonspecific abnormal finding of lung field: Secondary | ICD-10-CM | POA: Diagnosis not present

## 2022-10-29 MED ORDER — TRELEGY ELLIPTA 100-62.5-25 MCG/ACT IN AEPB: 1.0000 | INHALATION_SPRAY | Freq: Every day | RESPIRATORY_TRACT | 11 refills | Status: DC

## 2022-10-29 NOTE — Patient Instructions (Addendum)
Plan A = Automatic = Always=    Trelegy 100   one click each am   Plan B = Backup (to supplement plan A, not to replace it) Only use your albuterol inhaler as a rescue medication to be used if you can't catch your breath by resting or doing a relaxed purse lip breathing pattern.  - The less you use it, the better it will work when you need it. - Ok to use the inhaler up to 2 puffs  every 4 hours if you must but call for appointment if use goes up over your usual need - Don't leave home without it !!  (think of it like the spare tire for your car)   Plan C = Crisis (instead of Plan B but only if Plan B stops working) - only use your albuterol nebulizer if you first try Plan B and it fails to help > ok to use the nebulizer up to every 4 hours but if start needing it regularly call for immediate appointment   Also  Ok to try albuterol 15 min before an activity (on alternating days)  that you know would usually make you short of breath and see if it makes any difference and if makes none then don't take albuterol after activity unless you can't catch your breath as this means it's the resting that helps, not the albuterol.      Please schedule a follow up visit in 6 months but call sooner if needed  with all medications /inhalers/ solutions in hand so we can verify exactly what you are taking. This includes all medications from all doctors and over the counters

## 2022-10-29 NOTE — Assessment & Plan Note (Addendum)
See CTa 1/10/2 x 3 noudles largest 6 mm lower lobes> rec f/u CT at 6 m - CT super D done 10/02/21 > no change > referred for LDSCT yearly  - CXR at UC reported to show R lung atx ? 02/28/22 Rx doxy > rec f/u cxr not done -  10/29/2022 referred for  LCS   Discussed in detail all the  indications, usual  risks and alternatives  relative to the benefits with patient who agrees to proceed with w/u as outlined.             Each maintenance medication was reviewed in detail including emphasizing most importantly the difference between maintenance and prns.  Total time for H and P, chart review, counseling, reviewing when to use which hfa/neb/dpi device(s) (see ABC action plan per AVS) and generating customized AVS unique to this office visit / same day charting = 30 min

## 2022-10-29 NOTE — Assessment & Plan Note (Signed)
D/c acei 03/06/2021 due to pseudowheeze > better 04/27/2021 > d/c'd again 07/11/2022   He is back on ACEi and tol well but note trelegy and acei can be synergistic destabilizing the upper airway so if cough or "wheeze" worse strongly rec d/c ACEi in favor of any number of available ARBs likely to be on his formulary

## 2022-10-29 NOTE — Assessment & Plan Note (Addendum)
Quit smoking 03/2021 @ 160/MM - PFT's by [facility] around 2017 /18 reported "stage 3" - 10/01/2019  After extensive coaching inhaler device,  effectiveness =    90% with elipta >> continue trelegy   - PFT's  11/24/19   FEV1 1.51 (42 % ) ratio 0.35  p 1 % improvement from saba p "inhaler" 6 h prior to study with DLCO  12.75 (46%) corrects to 2.11 (50%)  for alv volume and FV curve classic concave exp flow vol   - 03/06/2021  After extensive coaching inhaler device,  effectiveness =    75% continue trelegy and try off acei due to pseudowheeze on exam - 04/27/2021  After extensive coaching inhaler device,  effectiveness =    75% with smi/respimat so try stioilto 2 each am as breaking thru trelegy with lots of upper airway sympoms > preferred trelegy  - 07/06/2021   Walked on RA  x  3  lap(s) =  approx 450  ft  @ mod pace, stopped due to end of study with lowest 02 sats 94% and sob on 2nd lap   - 07/06/2021 started pred x 6 days as PLAN D > d/c 'd 10/29/2022  - 03/05/2022   Walked on RA  x  3  lap(s) =  approx 450  ft  @ fast pace, stopped due to end of study  with lowest 02 sats 94% -  03/05/2022  After extensive coaching inhaler device,  effectiveness =    75% with hfa so try breztri in place of trelegy > preferred trelegy  - Labs ordered 07/11/2022  :  allergy screen Eos 0.2   alpha one AT phenotype MM    Group D (now reclassified as E) in terms of symptom/risk and laba/lama/ICS  therefore appropriate rx at this point >>>  trelegy plus more approp saba  - The proper method of use, as well as anticipated side effects, of a metered-dose inhaler were discussed and demonstrated to the patient using teach back method.   Re SABA :  I spent extra time with pt today reviewing appropriate use of albuterol for prn use on exertion with the following points: 1) saba is for relief of sob that does not improve by walking a slower pace or resting but rather if the pt does not improve after trying this first. 2) If the pt is  convinced, as many are, that saba helps recover from activity faster then it's easy to tell if this is the case by re-challenging : ie stop, take the inhaler, then p 5 minutes try the exact same activity (intensity of workload) that just caused the symptoms and see if they are substantially diminished or not after saba 3) if there is an activity that reproducibly causes the symptoms, try the saba 15 min before the activity on alternate days   If in fact the saba really does help, then fine to continue to use it prn but advised may need to look closer at the maintenance regimen being used to achieve better control of airways disease with exertion.

## 2022-11-20 ENCOUNTER — Telehealth: Payer: Self-pay | Admitting: Orthopaedic Surgery

## 2022-11-20 MED ORDER — OXYCODONE-ACETAMINOPHEN 5-325 MG PO TABS: 1.0000 | ORAL_TABLET | Freq: Four times a day (QID) | ORAL | 0 refills | Status: DC | PRN

## 2022-11-20 NOTE — Telephone Encounter (Signed)
Dr. Sanjuan Dame pt - pt lvm requesting a refill on Oxycodone 5-[facility] [address]

## 2022-11-26 ENCOUNTER — Other Ambulatory Visit: Payer: Self-pay | Admitting: *Deleted

## 2022-11-26 DIAGNOSIS — Z87891 Personal history of nicotine dependence: Secondary | ICD-10-CM

## 2022-11-26 DIAGNOSIS — Z122 Encounter for screening for malignant neoplasm of respiratory organs: Secondary | ICD-10-CM

## 2022-12-20 ENCOUNTER — Telehealth: Payer: Self-pay | Admitting: Orthopaedic Surgery

## 2022-12-20 MED ORDER — OXYCODONE-ACETAMINOPHEN 5-325 MG PO TABS: 1.0000 | ORAL_TABLET | Freq: Four times a day (QID) | ORAL | 0 refills | Status: DC | PRN

## 2022-12-20 NOTE — Telephone Encounter (Signed)
Dr. Sanjuan Dame pt - pt lvm requesting a refill on his Oxycodone 5-325, 26 tablets, Every 6 hours PRN [facility] [place].

## 2022-12-25 ENCOUNTER — Ambulatory Visit: Payer: Medicare HMO | Admitting: Orthopaedic Surgery

## 2023-01-01 ENCOUNTER — Encounter: Payer: Self-pay | Admitting: Orthopaedic Surgery

## 2023-01-01 ENCOUNTER — Ambulatory Visit (INDEPENDENT_AMBULATORY_CARE_PROVIDER_SITE_OTHER): Payer: Medicare Other | Admitting: Orthopaedic Surgery

## 2023-01-01 DIAGNOSIS — M25511 Pain in right shoulder: Secondary | ICD-10-CM | POA: Diagnosis not present

## 2023-01-01 DIAGNOSIS — F17210 Nicotine dependence, cigarettes, uncomplicated: Secondary | ICD-10-CM

## 2023-01-01 DIAGNOSIS — G8929 Other chronic pain: Secondary | ICD-10-CM | POA: Diagnosis not present

## 2023-01-01 MED ORDER — METHYLPREDNISOLONE ACETATE 40 MG/ML IJ SUSP
40.0000 mg | Freq: Once | INTRAMUSCULAR | Status: AC
Start: 2023-01-01 — End: 2023-01-01
  Administered 2023-01-01: 40 mg via INTRA_ARTICULAR

## 2023-01-01 NOTE — Progress Notes (Signed)
PROCEDURE NOTE:  The patient request injection, verbal consent was obtained.  The right shoulder was prepped appropriately after time out was performed.   Sterile technique was observed and injection of 1 cc of DepoMedrol 40mg  with several cc's of plain xylocaine. Anesthesia was provided by ethyl chloride and a 20-gauge needle was used to inject the shoulder area. A posterior approach was used.  The injection was tolerated well.  A band aid dressing was applied.  The patient was advised to apply ice later today and tomorrow to the injection sight as needed.  Encounter Diagnoses  Name Primary?   Chronic right shoulder pain Yes   Nicotine dependence, cigarettes, uncomplicated    Return prn  Call if any problem.  Precautions discussed.  Electronically Signed Darreld Mclean, MD 9/17/20242:01 PM

## 2023-01-02 ENCOUNTER — Encounter: Payer: Self-pay | Admitting: Acute Care

## 2023-01-02 ENCOUNTER — Ambulatory Visit: Payer: Medicare Other | Admitting: Acute Care

## 2023-01-02 DIAGNOSIS — Z87891 Personal history of nicotine dependence: Secondary | ICD-10-CM

## 2023-01-02 NOTE — Patient Instructions (Signed)
Thank you for participating in the [facility] Cancer Screening Program. It was our pleasure to meet you today. We will call you with the results of your scan within the next few days. Your scan will be assigned a Lung RADS category score by the physicians reading the scans.  This Lung RADS score determines follow up scanning.  See below for description of categories, and follow up screening recommendations. We will be in touch to schedule your follow up screening annually or based on recommendations of our providers. We will fax a copy of your scan results to your Primary Care Physician, or the physician who referred you to the program, to ensure they have the results. Please call the office if you have any questions or concerns regarding your scanning experience or results.  Our office number is [phone]. Please speak with Abigail Miyamoto, RN., Karlton Lemon RN, or Pietro Cassis RN. They are  our Lung Cancer Screening RN.'s If They are unavailable when you call, Please leave a message on the voice mail. We will return your call at our earliest convenience.This voice mail is monitored several times a day.  Remember, if your scan is normal, we will scan you annually as long as you continue to meet the criteria for the program. (Age 65-80, Current smoker or smoker who has quit within the last 15 years). If you are a smoker, remember, quitting is the single most powerful action that you can take to decrease your risk of lung cancer and other pulmonary, breathing related problems. We know quitting is hard, and we are here to help.  Please let us know if there is anything we can do to help you meet your goal of quitting. If you are a former smoker, Counselling psychologist. We are proud of you! Remain smoke free! Remember you can refer friends or family members through the number above.  We will screen them to make sure they meet criteria for the program. Thank you for helping [place] take better care of you  by participating in Lung Screening.   Lung RADS Categories:  Lung RADS 1: no nodules or definitely non-concerning nodules.  Recommendation is for a repeat annual scan in 12 months.  Lung RADS 2:  nodules that are non-concerning in appearance and behavior with a very low likelihood of becoming an active cancer. Recommendation is for a repeat annual scan in 12 months.  Lung RADS 3: nodules that are probably non-concerning , includes nodules with a low likelihood of becoming an active cancer.  Recommendation is for a 14-month repeat screening scan. Often noted after an upper respiratory illness. We will be in touch to make sure you have no questions, and to schedule your 66-month scan.  Lung RADS 4 A: nodules with concerning findings, recommendation is most often for a follow up scan in 3 months or additional testing based on our provider's assessment of the scan. We will be in touch to make sure you have no questions and to schedule the recommended 3 month follow up scan.  Lung RADS 4 B:  indicates findings that are concerning. We will be in touch with you to schedule additional diagnostic testing based on our provider's  assessment of the scan.  You can receive free nicotine replacement therapy ( patches, gum or mints) by calling 1-800-QUIT NOW. Please call so we can get you on the path to becoming  a non-smoker. I know it is hard, but you can do this!  Other options for assistance in  smoking cessation ( As covered by your insurance benefits)  Hypnosis for smoking cessation  [employer] [phone]  Acupuncture for smoking cessation  [employer] [phone]

## 2023-01-02 NOTE — Progress Notes (Signed)
Virtual Visit via Telephone Note  I connected with Vickey Huger on 01/02/23 at  8:30 AM EDT by telephone and verified that I am speaking with the correct person using two identifiers.  Location: Patient:  At home Provider: [address] [place], [place], [address]    I discussed the limitations, risks, security and privacy concerns of performing an evaluation and management service by telephone and the availability of in person appointments. I also discussed with the patient that there may be a patient responsible charge related to this service. The patient expressed understanding and agreed to proceed.    Shared Decision Making Visit Lung Cancer Screening Program [phone])   Eligibility: Age 67 y.o. Pack Years Smoking History Calculation 23 pack year smoking history (# packs/per year x # years smoked) Recent History of coughing up blood  no Unexplained weight loss? no ( >Than 15 pounds within the last 6 months ) Prior History Lung / other cancer no (Diagnosis within the last 5 years already requiring surveillance chest CT Scans). Smoking Status Former Smoker Former Smokers: Years since quit: 1 year  Quit Date: 02/20/2021  Visit Components: Discussion included one or more decision making aids. yes Discussion included risk/benefits of screening. yes Discussion included potential follow up diagnostic testing for abnormal scans. yes Discussion included meaning and risk of over diagnosis. yes Discussion included meaning and risk of False Positives. yes Discussion included meaning of total radiation exposure. yes  Counseling Included: Importance of adherence to annual lung cancer LDCT screening. yes Impact of comorbidities on ability to participate in the program. yes Ability and willingness to under diagnostic treatment. yes  Smoking Cessation Counseling: Current Smokers:  Discussed importance of smoking cessation. yes Information about tobacco cessation classes and  interventions provided to patient. yes Patient provided with "ticket" for LDCT Scan. yes Symptomatic Patient. no  Counseling NA Diagnosis Code: Tobacco Use Z72.0 Asymptomatic Patient yes  Counseling (Intermediate counseling: > three minutes counseling) U0454 Former Smokers:  Discussed the importance of maintaining cigarette abstinence. yes Diagnosis Code: Personal History of Nicotine Dependence. U98.119 Information about tobacco cessation classes and interventions provided to patient. Yes Patient provided with "ticket" for LDCT Scan. yes Written Order for Lung Cancer Screening with LDCT placed in Epic. Yes (CT Chest Lung Cancer Screening Low Dose W/O CM) [id number] Z12.2-Screening of respiratory organs Z87.891-Personal history of nicotine dependence  I spent 25 minutes of face to face time/virtual visit time  with Mr. Mosco discussing the risks and benefits of lung cancer screening. We took the time to pause the power point at intervals to allow for questions to be asked and answered to ensure understanding. We discussed that he had taken the single most powerful action possible to decrease his risk of developing lung cancer when he quit smoking. I counseled him to remain smoke free, and to contact me if he ever had the desire to smoke again so that I can provide resources and tools to help support the effort to remain smoke free. We discussed the time and location of the scan, and that either  Abigail Miyamoto RN, Karlton Lemon, RN or I  or I will call / send a letter with the results within  24-72 hours of receiving them. He has the office contact information in the event he needs to speak with me,  he verbalized understanding of all of the above and had no further questions upon leaving the office.     I explained to the patient that there has  been a high incidence of coronary artery disease noted on these exams. I explained that this is a non-gated exam therefore degree or severity cannot be  determined. This patient is not on statin therapy. I have asked the patient to follow-up with their PCP regarding any incidental finding of coronary artery disease and management with diet or medication as they feel is clinically indicated. The patient verbalized understanding of the above and had no further questions.     Bevelyn Ngo, NP 01/02/2023

## 2023-01-03 ENCOUNTER — Ambulatory Visit (HOSPITAL_COMMUNITY)
Admission: RE | Admit: 2023-01-03 | Discharge: 2023-01-03 | Disposition: A | Discharge status: Home / Self Care | Payer: Medicare Other | Source: Ambulatory Visit | Attending: Acute Care | Admitting: Acute Care

## 2023-01-03 DIAGNOSIS — Z87891 Personal history of nicotine dependence: Secondary | ICD-10-CM | POA: Diagnosis present

## 2023-01-03 DIAGNOSIS — Z122 Encounter for screening for malignant neoplasm of respiratory organs: Secondary | ICD-10-CM | POA: Insufficient documentation

## 2023-01-15 ENCOUNTER — Telehealth: Payer: Self-pay | Admitting: Orthopaedic Surgery

## 2023-01-15 MED ORDER — OXYCODONE-ACETAMINOPHEN 5-325 MG PO TABS: 1.0000 | ORAL_TABLET | Freq: Four times a day (QID) | ORAL | 0 refills | Status: DC | PRN

## 2023-01-15 NOTE — Telephone Encounter (Signed)
Dr. Sanjuan Dame pt - pt lvm requesting a refill on Oxycodone 5-325 [facility]

## 2023-01-18 ENCOUNTER — Other Ambulatory Visit: Payer: Self-pay

## 2023-01-18 DIAGNOSIS — Z122 Encounter for screening for malignant neoplasm of respiratory organs: Secondary | ICD-10-CM

## 2023-01-18 DIAGNOSIS — Z87891 Personal history of nicotine dependence: Secondary | ICD-10-CM

## 2023-02-18 ENCOUNTER — Other Ambulatory Visit: Payer: Self-pay | Admitting: Orthopaedic Surgery

## 2023-02-18 MED ORDER — OXYCODONE-ACETAMINOPHEN 5-325 MG PO TABS: 1.0000 | ORAL_TABLET | Freq: Four times a day (QID) | ORAL | 0 refills | Status: DC | PRN

## 2023-02-18 NOTE — Telephone Encounter (Signed)
Dr. Sanjuan Dame pt - spoke w/the patient, he is requesting a refill on Oxycodone 5-325, 26 tablets, every 6 hours PRN to [facility] [place].

## 2023-03-07 ENCOUNTER — Telehealth: Payer: Self-pay | Admitting: Orthopaedic Surgery

## 2023-03-07 NOTE — Telephone Encounter (Signed)
Dr. Sanjuan Dame pt - tried returning the pt's call to schedule a shoulder injection, unable to lvm

## 2023-03-20 ENCOUNTER — Encounter: Payer: Self-pay | Admitting: Orthopaedic Surgery

## 2023-03-20 ENCOUNTER — Ambulatory Visit: Payer: Medicare Other | Admitting: Orthopaedic Surgery

## 2023-03-20 DIAGNOSIS — M25511 Pain in right shoulder: Secondary | ICD-10-CM

## 2023-03-20 DIAGNOSIS — G8929 Other chronic pain: Secondary | ICD-10-CM

## 2023-03-20 MED ORDER — OXYCODONE-ACETAMINOPHEN 5-325 MG PO TABS: 1.0000 | ORAL_TABLET | Freq: Four times a day (QID) | ORAL | 0 refills | Status: DC | PRN

## 2023-03-20 NOTE — Progress Notes (Signed)
PROCEDURE NOTE:  The patient request injection, verbal consent was obtained.  The right shoulder was prepped appropriately after time out was performed.   Sterile technique was observed and injection of 1 cc of DepoMedrol 40mg  with several cc's of plain xylocaine. Anesthesia was provided by ethyl chloride and a 20-gauge needle was used to inject the shoulder area. A posterior approach was used.  The injection was tolerated well.  A band aid dressing was applied.  The patient was advised to apply ice later today and tomorrow to the injection sight as needed.  Encounter Diagnosis  Name Primary?   Chronic right shoulder pain Yes   Return prn.  I have reviewed the [place] Controlled Substance Reporting System web site prior to prescribing narcotic medicine for this patient.  Call if any problem.  Precautions discussed.  Electronically Signed Darreld Mclean, MD 12/4/20248:55 AM

## 2023-04-18 ENCOUNTER — Telehealth: Payer: Self-pay | Admitting: Orthopaedic Surgery

## 2023-04-18 ENCOUNTER — Other Ambulatory Visit: Payer: Self-pay | Admitting: Orthopedic Surgery

## 2023-04-18 MED ORDER — OXYCODONE-ACETAMINOPHEN 5-325 MG PO TABS: 1.0000 | ORAL_TABLET | Freq: Four times a day (QID) | ORAL | 0 refills | Status: DC | PRN

## 2023-04-18 NOTE — Progress Notes (Signed)
 Covering physician for Dr. Hilda Lias  Meds ordered this encounter  Medications   oxyCODONE-acetaminophen (PERCOCET/ROXICET) 5-325 MG tablet    Sig: Take 1 tablet by mouth every 6 (six) hours as needed. for pain    Dispense:  26 tablet    Refill:  0

## 2023-04-18 NOTE — Telephone Encounter (Signed)
 Dr. Sanjuan Dame pt - pt lvm requesting a refill on Oxycodone 5-325, 26 tablets, every 6 hours PRN [facility] [place] [place]

## 2023-04-28 NOTE — Progress Notes (Deleted)
 KENTAVIUS DETTORE, male    DOB: Sep 20, 1955    MRN: [id number]   Brief patient profile:  67  yowm quit smoking 04/09/21 with new onset doe x 2017 eval by R[facility] and [name] dx copd and rx trelegy self referred to the pulmonary clinic in [place] 10/01/2019    History of Present Illness  10/01/2019  Pulmonary/ 1st office eval/Denya Buckingham  Chief Complaint  Patient presents with   Pulmonary Consult    Former pt of Dr Vonzell- COPD. He states today his breathing is average- needing samples of trelegy. He states this helps his SOB more than anything.   Dyspnea:  MMRC3 = can't walk 100 yards even at a slow pace at a flat grade s stopping due to sob  Difficulty with [employer] even going slowly and not checking sats/ worse since ran out of trelegy  Cough: none  Sleep: ok 30-45 degrees due to gerd not cough or sob  SABA use: once or twice a week rec Nexium 40 mg   Take  30-60 min before first meal of the day and Pepcid  (famotidine )  20 mg one after supper or before bedtime  until return to office - this is the best way to tell whether stomach acid is contributing to your problem.   GERD  No change in trelegy for now - be sure to brush teeth and gargle with arm and hammer toothpaste Only use your albuterol  as a rescue medication    03/05/2022  f/u ov/[place] office/Garon Melander re: GOLD 3  maint on trelegy / still off cigs  Chief Complaint  Patient presents with   Follow-up    Was seen at a UC in [place] and told his R lung collapsed partially and he was having a COPD flare up.   Dyspnea:  no longer working working out  Cough: yellow mucus rx doxy per UC  Sleeping: 45 degree bed  SABA use: not every day hfa/ twice a week neb  02: none  Covid status: vax max  Lung cancer screening: referred  Rec Plan A = Automatic = Always=    Breztri  Take 2 puffs first thing in am and then another 2 puffs about 12 hours later.  Plan B = Backup (to supplement plan A, not to replace it) Only use your albuterol   inhaler as a rescue medication  Plan C = Crisis (instead of Plan B but only if Plan B stops working) - only use your albuterol  nebulizer if you first try Plan B  Plan D = Deltasone  = prednisone   - if ABC not correcting the problem > Prednisone  10 mg take  4 each am x 2 days,   2 each am x 2 days,  1 each am x 2 days and stop       07/11/2022  f/u ov/[place] office/Ailey Wessling re: GOLD 3  maint on trelegy/beztri/ acei   Chief Complaint  Patient presents with   Follow-up    Feels breztri  is not helping much with his congestion  Dyspnea:  [facility] [employer]  Cough: worse p supper and before bed s excess mucus/ pos globus sensation  Sleeping: 45 degree bed  SABA use: rarely  Lung cancer screening in program due q June  Rec Please remember to go to the lab department   for your tests - we will call you with the results when they are available. Stop trelegy and losartan  and lisinopril Add Avapro  150 mg one daily (ibesartan) Work on inhaler technique:  Please schedule a follow up visit in 3 months but call sooner if needed  with all medications /inhalers/ solutions in hand     10/29/2022  f/u ov/[place] office/Raelene Trew re: GOLD 3  maint on trelegy 100 did not bring meds  Chief Complaint  Patient presents with   COPD  Dyspnea:  working out at [name] :  walking 10 min slow to avg pace  3 x  Cough: gone  Sleeping: 45 degree bed no resp cc  SABA use: rarely but always after ex/ never prior or rechallenging  02: none Lung cancer screening: 10/29/2022 referred  Rec Plan A = Automatic = Always=    Trelegy 100   one click each am  Plan B = Backup (to supplement plan A, not to replace it) Only use your albuterol  inhaler as a rescue medication Plan C = Crisis (instead of Plan B but only if Plan B stops working) - only use your albuterol  nebulizer if you first try Plan B a Also  Ok to try albuterol  15 min before an activity (on alternating days)  that you know would usually make you short of breath   Please schedule a follow up visit in 6 months but call sooner if needed  with all medications /inhalers/ solutions in hand  04/29/2023  f/u ov/ office/[place] Sardo re: GOLD 3 COPD  maint on ***  did *** bring meds  No chief complaint on file.   Dyspnea:  *** Cough: *** Sleeping: ***   resp cc  SABA use: *** 02: ***  Lung cancer screening: ***   No obvious day to day or daytime variability or assoc excess/ purulent sputum or mucus plugs or hemoptysis or cp or chest tightness, subjective wheeze or overt sinus or hb symptoms.    Also denies any obvious fluctuation of symptoms with weather or environmental changes or other aggravating or alleviating factors except as outlined above   No unusual exposure hx or h/o childhood pna/ asthma or knowledge of premature birth.  Current Allergies, Complete Past Medical History, Past Surgical History, Family History, and Social History were reviewed in [name] record.  ROS  The following are not active complaints unless bolded Hoarseness, sore throat, dysphagia, dental problems, itching, sneezing,  nasal congestion or discharge of excess mucus or purulent secretions, ear ache,   fever, chills, sweats, unintended wt loss or wt gain, classically pleuritic or exertional cp,  orthopnea pnd or arm/hand swelling  or leg swelling, presyncope, palpitations, abdominal pain, anorexia, nausea, vomiting, diarrhea  or change in bowel habits or change in bladder habits, change in stools or change in urine, dysuria, hematuria,  rash, arthralgias, visual complaints, headache, numbness, weakness or ataxia or problems with walking or coordination,  change in mood or  memory.        No outpatient medications have been marked as taking for the 04/29/23 encounter (Appointment) with Darlean Ozell NOVAK, MD.                  Past Medical History:  Diagnosis Date   Ankle fracture, right 06/2012   Anxiety    Arthritis    Colon polyps     adenomatous polyps on multiple colonoscopies, starting in his early 44s   COPD (chronic obstructive pulmonary disease) (HCC)    Depression    History of DVT (deep vein thrombosis)    Hypertension    Kidney stones    Seizures (HCC)    has seizures weekly   Sleep  apnea    Stroke [facility]) 2005   post knee surgery in 2005         Objective:     Wts  04/29/2023        ***  10/29/2022       177  07/11/2022       177 03/05/2022      170 01/18/2022       175  11/14/2021         178 07/06/2021       174  04/27/2021       164 03/06/2021     166   10/01/19 158 lb (71.7 kg)  06/25/19 165 lb (74.8 kg)  06/03/19 163 lb 2 oz (74 kg)    Vital signs reviewed  04/29/2023  - Note at rest 02 sats  ***% on ***   General appearance:    ***    Mild bar***  .      Assessment

## 2023-04-29 ENCOUNTER — Ambulatory Visit: Payer: Medicare Other | Admitting: Internal Medicine

## 2023-05-02 ENCOUNTER — Ambulatory Visit: Payer: Medicare Other | Admitting: Internal Medicine

## 2023-05-20 ENCOUNTER — Telehealth: Payer: Self-pay | Admitting: Orthopaedic Surgery

## 2023-05-20 MED ORDER — OXYCODONE-ACETAMINOPHEN 5-325 MG PO TABS: 1.0000 | ORAL_TABLET | Freq: Four times a day (QID) | ORAL | 0 refills | Status: DC | PRN

## 2023-05-20 NOTE — Telephone Encounter (Signed)
Dr. Sanjuan Wheeler pt - pt lvm requesting a refill on Oxycodone 5-[facility] [address] [facility] [place], [place]

## 2023-05-22 NOTE — Progress Notes (Signed)
 Adam Wheeler, male    DOB: [birth year]    MRN: [id number]   Brief patient profile:  56  yowm MM/quit smoking 04/09/21 with new onset doe x 2017 eval by Adam Wheeler and Adam Wheeler dx copd and rx trelegy self referred to the pulmonary clinic in [place] 10/01/2019    History of Present Illness  10/01/2019  Pulmonary/ 1st office eval/Adam Wheeler  Chief Complaint  Patient presents with   Pulmonary Consult    Former pt of Adam Wheeler- COPD. He states today his breathing is average- needing samples of trelegy. He states this helps his SOB more than anything.   Dyspnea:  MMRC3 = can't walk 100 yards even at a slow pace at a flat grade s stopping due to sob  Difficulty with [employer] even going slowly and not checking sats/ worse since ran out of trelegy  Cough: none  Sleep: ok 30-45 degrees due to gerd not cough or sob  SABA use: once or twice a week rec Nexium 40 mg   Take  30-60 min before first meal of the day and Pepcid  (famotidine )  20 mg one after supper or before bedtime  until return to office - this is the best way to tell whether stomach acid is contributing to your problem.   GERD  No change in trelegy for now - be sure to brush teeth and gargle with arm and hammer toothpaste Only use your albuterol  as a rescue medication    03/05/2022  f/u ov/[place] office/Adam Wheeler re: GOLD 3  maint on trelegy / still off cigs  Chief Complaint  Patient presents with   Follow-up    Was seen at a UC in [place] and told his R lung collapsed partially and he was having a COPD flare up.   Dyspnea:  no longer working working out  Cough: yellow mucus rx doxy per UC  Sleeping: 45 degree bed  SABA use: not every day hfa/ twice a week neb  02: none  Covid status: vax max  Lung cancer screening: referred  Rec Plan A = Automatic = Always=    Breztri  Take 2 puffs first thing in am and then another 2 puffs about 12 hours later.  Plan B = Backup (to supplement plan A, not to replace it) Only use your albuterol   inhaler as a rescue medication  Plan C = Crisis (instead of Plan B but only if Plan B stops working) - only use your albuterol  nebulizer if you first try Plan B  Plan D = Deltasone  = prednisone   - if ABC not correcting the problem > Prednisone  10 mg take  4 each am x 2 days,   2 each am x 2 days,  1 each am x 2 days and stop      10/29/2022  f/u ov/[place] office/Adam Wheeler re: GOLD 3  maint on trelegy 100 did not bring meds  Chief Complaint  Patient presents with   COPD  Dyspnea:  working out at [name] :  walking 10 min slow to avg pace  3 x wks Cough: gone  Sleeping: 45 degree bed no resp cc  SABA use: rarely but always after ex/ never prior or rechallenging  02: none Lung cancer screening: 10/29/2022 referred  Rec Plan A = Automatic = Always=    Trelegy 100   one click each am  Plan B = Backup (to supplement plan A, not to replace it) Only use your albuterol  inhaler as a rescue medication Plan C = Crisis (  instead of Plan B but only if Plan B stops working) - only use your albuterol  nebulizer if you first try Plan B a Also  Ok to try albuterol  15 min before an activity (on alternating days)  that you know would usually make you short of breath  Please schedule a follow up visit in 6 months but call sooner if needed  with all medications /inhalers/ solutions in hand   05/24/2023  f/u ov/[place] office/Adam Wheeler re: GOLD 3 COPD  maint on Trelelgy   did not  bring meds as requested Chief Complaint  Patient presents with   Follow-up    6 month follow up   Dyspnea:  still working out at [name]  - but still can't do hills  Cough: none  Sleeping:  head of bed on one  2x4  so up 2 in s  resp cc  SABA use: not using  02: none   Lung cancer screening: neg 01/03/23 / reviewed with pt   No obvious day to day or daytime variability or assoc excess/ purulent sputum or mucus plugs or hemoptysis or cp or chest tightness, subjective wheeze or overt sinus or hb symptoms.    Also denies any obvious fluctuation  of symptoms with weather or environmental changes or other aggravating or alleviating factors except as outlined above   No unusual exposure hx or h/o childhood pna/ asthma or knowledge of premature birth.  Current Allergies, Complete Past Medical History, Past Surgical History, Family History, and Social History were reviewed in [name] record.  ROS  The following are not active complaints unless bolded Hoarseness, sore throat, dysphagia, dental problems, itching, sneezing,  nasal congestion or discharge of excess mucus or purulent secretions, ear ache,   fever, chills, sweats, unintended wt loss or wt gain, classically pleuritic or exertional cp,  orthopnea pnd or arm/hand swelling  or leg swelling, presyncope, palpitations, abdominal pain, anorexia, nausea, vomiting, diarrhea  or change in bowel habits or change in bladder habits, change in stools or change in urine, dysuria, hematuria,  rash, arthralgias, visual complaints, headache, numbness, weakness or ataxia or problems with walking or coordination,  change in mood or  memory.        Current Meds  Medication Sig   albuterol  (PROVENTIL ) (2.5 MG/3ML) 0.083% nebulizer solution Take 3 mLs (2.5 mg total) by nebulization every 6 (six) hours as needed for wheezing or shortness of breath.   albuterol  (VENTOLIN  HFA) 108 (90 Base) MCG/ACT inhaler Inhale 2 puffs into the lungs every 6 (six) hours as needed for wheezing or shortness of breath.   ALPRAZolam (XANAX) 1 MG tablet Take 1 mg by mouth 2 (two) times daily as needed for anxiety.   ARIPiprazole (ABILIFY) 10 MG tablet Take 10 mg by mouth every morning.   carbidopa-levodopa (SINEMET IR) 10-100 MG tablet Take by mouth.   diclofenac  (VOLTAREN ) 75 MG EC tablet Take 1 tablet (75 mg total) by mouth 2 (two) times daily with a meal.   dicyclomine  (BENTYL ) 10 MG capsule Take 1 capsule (10 mg total) by mouth 4 (four) times daily -  before meals and at bedtime. As needed for  looser stool. Monitor for constipation, dry mouth, dizziness. (Patient taking differently: Take 10 mg by mouth 3 (three) times daily as needed for spasms. As needed for looser stool. Monitor for constipation, dry mouth, dizziness.)   famotidine  (PEPCID ) 20 MG tablet One after supper (Patient taking differently: Take 20 mg by mouth every evening. One after  supper)   FLUoxetine (PROZAC) 40 MG capsule Take 40 mg by mouth every morning.   Fluticasone-Umeclidin-Vilant (TRELEGY ELLIPTA ) 100-62.5-25 MCG/ACT AEPB Inhale 1 puff into the lungs daily.   lisinopril (ZESTRIL) 20 MG tablet Take 20 mg by mouth daily.   losartan  (COZAAR ) 50 MG tablet Take 50 mg by mouth daily.   NEXIUM 40 MG capsule Take 40 mg by mouth daily as needed (heart burn).   oxyCODONE -acetaminophen  (PERCOCET/ROXICET) 5-325 MG tablet Take 1 tablet by mouth every 6 (six) hours as needed. for pain                  Past Medical History:  Diagnosis Date   Ankle fracture, right 06/2012   Anxiety    Arthritis    Colon polyps    adenomatous polyps on multiple colonoscopies, starting in his early 6s   COPD (chronic obstructive pulmonary disease) (HCC)    Depression    History of DVT (deep vein thrombosis)    Hypertension    Kidney stones    Seizures (HCC)    has seizures weekly   Sleep apnea    Stroke [facility]) 2005   post knee surgery in 2005         Objective:     Wts  05/24/2023         168  10/29/2022       177  07/11/2022       177 03/05/2022      170 01/18/2022       175  11/14/2021         178 07/06/2021       174  04/27/2021       164 03/06/2021     166   10/01/19 158 lb (71.7 kg)  06/25/19 165 lb (74.8 kg)  06/03/19 163 lb 2 oz (74 kg)    Vital signs reviewed  05/24/2023  - Note at rest 02 sats  90% on RA   General appearance:    pleasant amb wm nad    HEENT : Oropharynx  clear       NECK :  without  apparent JVD/ palpable Nodes/TM    LUNGS: no acc muscle use,  Mild barrel  contour chest wall with  bilateral  Distant bs s audible wheeze and  without cough on insp or exp maneuvers  and mild  Hyperresonant  to  percussion bilaterally     CV:  RRR  no s3 or murmur or increase in P2, and no edema   ABD:  soft and nontender with pos end  insp Hoover's  in the supine position.  No bruits or organomegaly appreciated   MS:  Nl gait/ ext warm without deformities Or obvious joint restrictions  calf tenderness, cyanosis or clubbing     SKIN: warm and dry without lesions    NEURO:  alert, approp, nl sensorium with  no motor or cerebellar deficits apparent.    .     I personally reviewed images and agree with radiology impression as follows:   Chest LDSCT   01/03/23 1. Lung-RADS 2, benign appearance or behavior. Continue annual screening with low-dose chest CT without contrast in 12 months. 2. Aortic atherosclerosis (ICD10-I70.0), coronary artery atherosclerosis and emphysema (ICD10-J43.9). 3. Distal esophageal wall thickening suggests esophagitis.       Assessment

## 2023-05-24 ENCOUNTER — Ambulatory Visit (INDEPENDENT_AMBULATORY_CARE_PROVIDER_SITE_OTHER): Payer: Medicare Other | Admitting: Internal Medicine

## 2023-05-24 ENCOUNTER — Encounter: Payer: Self-pay | Admitting: Internal Medicine

## 2023-05-24 VITALS — BP 133/87 | HR 56 | Ht 70.0 in | Wt 168.4 lb

## 2023-05-24 DIAGNOSIS — J449 Chronic obstructive pulmonary disease, unspecified: Secondary | ICD-10-CM

## 2023-05-24 DIAGNOSIS — R918 Other nonspecific abnormal finding of lung field: Secondary | ICD-10-CM | POA: Diagnosis not present

## 2023-05-24 DIAGNOSIS — F17210 Nicotine dependence, cigarettes, uncomplicated: Secondary | ICD-10-CM

## 2023-05-24 MED ORDER — ALBUTEROL SULFATE HFA 108 (90 BASE) MCG/ACT IN AERS: INHALATION_SPRAY | RESPIRATORY_TRACT | 2 refills | Status: DC

## 2023-05-24 NOTE — Assessment & Plan Note (Signed)
 In system for LDSCT  q septembber

## 2023-05-24 NOTE — Patient Instructions (Signed)
 Only use your albuterol  as a rescue medication to be used if you can't catch your breath by resting or doing a relaxed purse lip breathing pattern.  - The less you use it, the better it will work when you need it. - Ok to use up to 2 puffs  every 4 hours if you must but call for immediate appointment if use goes up over your usual need - Don't leave home without it !!  (think of it like the spare tire for your car)   Also  Ok to try albuterol  15 min before an activity (on alternating days)  that you know would usually make you short of breath and see if it makes any difference and if makes none then don't take albuterol  after activity unless you can't catch your breath as this means it's the resting that helps, not the albuterol .      No change in medications  Please schedule a follow up visit in 6 months but call sooner if needed  with all medications /inhalers/ solutions in hand so we can verify exactly what you are taking. This includes all medications from all doctors and over the counters

## 2023-05-24 NOTE — Assessment & Plan Note (Addendum)
 Quit smoking 03/2021 @ 160/MM - PFT's by [facility] around 2017 /18 reported stage 3 - 10/01/2019  After extensive coaching inhaler device,  effectiveness =    90% with elipta >> continue trelegy   - PFT's  11/24/19   FEV1 1.51 (42 % ) ratio 0.35  p 1 % improvement from saba p inhaler 6 h prior to study with DLCO  12.75 (46%) corrects to 2.11 (50%)  for alv volume and FV curve classic concave exp flow vol   - 03/06/2021  After extensive coaching inhaler device,  effectiveness =    75% continue trelegy and try off acei due to pseudowheeze on exam - 04/27/2021  After extensive coaching inhaler device,  effectiveness =    75% with smi/respimat so try stioilto 2 each am as breaking thru trelegy with lots of upper airway sympoms > preferred trelegy  - 07/06/2021   Walked on RA  x  3  lap(s) =  approx 450  ft  @ mod pace, stopped due to end of study with lowest 02 sats 94% and sob on 2nd lap   - 07/06/2021 started pred x 6 days as PLAN D > d/c 'd 10/29/2022  - 03/05/2022   Walked on RA  x  3  lap(s) =  approx 450  ft  @ fast pace, stopped due to end of study  with lowest 02 sats 94% -  03/05/2022  After extensive coaching inhaler device,  effectiveness =    75% with hfa so try breztri  in place of trelegy > preferred trelegy  - Labs ordered 07/11/2022  :  allergy screen Eos 0.2   alpha one AT phenotype MM   - The proper method of use, as well as anticipated side effects, of a metered-dose inhaler were discussed and demonstrated to the patient using teach back method using empty HFA cannister as a prop     Re SABA :  I spent extra time with pt today reviewing appropriate use of albuterol  for prn use on exertion with the following points: 1) saba is for relief of sob that does not improve by walking a slower pace or resting but rather if the pt does not improve after trying this first. 2) If the pt is convinced, as many are, that saba helps recover from activity faster then it's easy to tell if this is the case by  re-challenging : ie stop, take the inhaler, then p 5 minutes try the exact same activity (intensity of workload) that just caused the symptoms and see if they are substantially diminished or not after saba 3) if there is an activity that reproducibly causes the symptoms, try the saba 15 min before the activity on alternate days   If in fact the saba really does help, then fine to continue to use it prn but advised may need to look closer at the maintenance regimen being used to achieve better control of airways disease with exertion.   F/u  q 6 m sooner if needed

## 2023-05-25 NOTE — Assessment & Plan Note (Signed)
 See CTa 1/10/2 x 3 noudles largest 6 mm lower lobes> rec f/u CT at 6 m - CT super D done 10/02/21 > no change > referred for LDSCT yearly  - CXR at UC reported to show R lung atx ? 02/28/22 Rx doxy > rec f/u cxr not done -  10/29/2022 referred for  LCS > RADS 2  01/03/23   Advised to continue with LDSCT for 15 y p quit smoking = 2037   Reviewed studies - not esosphagitis suggested and reminded re ppi qam and h2 qpm   Discussed in detail all the  indications, usual  risks and alternatives  relative to the benefits with patient who agrees to proceed with Rx as outlined.             Each maintenance medication was reviewed in detail including emphasizing most importantly the difference between maintenance and prns and under what circumstances the prns are to be triggered using an action plan format where appropriate.  Total time for H and P, chart review, counseling, reviewing hfa/ neb  device(s) and generating customized AVS unique to this office visit / same day charting = 31 min

## 2023-06-17 ENCOUNTER — Telehealth: Payer: Self-pay | Admitting: Orthopaedic Surgery

## 2023-06-17 MED ORDER — OXYCODONE-ACETAMINOPHEN 5-325 MG PO TABS: 1.0000 | ORAL_TABLET | Freq: Four times a day (QID) | ORAL | 0 refills | Status: DC | PRN

## 2023-06-17 NOTE — Telephone Encounter (Signed)
 Dr. Sanjuan Dame pt - pt lvm requesting a refill on Oxycodone 5-325 to be sent to [facility] in [place].

## 2023-07-22 ENCOUNTER — Telehealth: Payer: Self-pay | Admitting: Orthopaedic Surgery

## 2023-07-22 MED ORDER — OXYCODONE-ACETAMINOPHEN 5-325 MG PO TABS: 1.0000 | ORAL_TABLET | Freq: Four times a day (QID) | ORAL | 0 refills | Status: DC | PRN

## 2023-07-22 NOTE — Telephone Encounter (Signed)
 Dr. Sanjuan Dame pt - pt lvm requesting a refill for Oxycodone 5-325 to be [facility] to [facility] [place] on [address]

## 2023-08-06 ENCOUNTER — Telehealth: Payer: Self-pay | Admitting: Orthopaedic Surgery

## 2023-08-06 NOTE — Telephone Encounter (Signed)
 Dr. Vicente Graham pt - spoke w/Tonya Brenita Callow Case Manager w/[employer] [phone], she wanted to make sure Dr. Iline Mallory is aware that the patient is getting Oxycodone  from him and Xanax from Quince Bryant, NP w/[facility] [place], [place] in [place], [place] [phone].  She stated the combination is dangerous.  She would like for Dr. Iline Mallory to coordinate w/Casey Hudson Madeira, NP regarding the medications to help balance them.  She is also requesting that Dr. Iline Mallory send Naloxone for him to have on hand incase of an overdose.

## 2023-08-07 ENCOUNTER — Ambulatory Visit: Admitting: Orthopedic Surgery

## 2023-08-19 ENCOUNTER — Encounter: Payer: Self-pay | Admitting: Orthopaedic Surgery

## 2023-08-19 ENCOUNTER — Other Ambulatory Visit: Payer: Self-pay | Admitting: Orthopaedic Surgery

## 2023-08-19 NOTE — Telephone Encounter (Signed)
 Dr. Vicente Graham pt - spoke w/the pt, he is requesting a refill for Oxycodone  5-325 [facility] [address] in [place].

## 2023-08-20 NOTE — Telephone Encounter (Signed)
 Pt aware no more narcotics to be refilled by Dr. Iline Mallory.

## 2023-10-19 ENCOUNTER — Other Ambulatory Visit: Payer: Self-pay | Admitting: Internal Medicine

## 2024-01-04 NOTE — Progress Notes (Unsigned)
 Adam Wheeler, male    DOB: [birth year]    MRN: [id number]   Brief patient profile:  28  yowm MM/quit smoking 04/09/21 with new onset doe x 2017 eval by [facility] and [name] dx copd and rx trelegy self referred to the pulmonary clinic in [place] 10/01/2019    History of Present Illness  10/01/2019  Pulmonary/ 1st office eval/Adam Wheeler  Chief Complaint  Patient presents with   Pulmonary Consult    Former pt of Dr Vonzell- COPD. He states today his breathing is average- needing samples of trelegy. He states this helps his SOB more than anything.   Dyspnea:  MMRC3 = can't walk 100 yards even at a slow pace at a flat grade s stopping due to sob  Difficulty with [employer] even going slowly and not checking sats/ worse since ran out of trelegy  Cough: none  Sleep: ok 30-45 degrees due to gerd not cough or sob  SABA use: once or twice a week rec Nexium 40 mg   Take  30-60 min before first meal of the day and Pepcid  (famotidine )  20 mg one after supper or before bedtime  until return to office - this is the best way to tell whether stomach acid is contributing to your problem.   GERD  No change in trelegy for now - be sure to brush teeth and gargle with arm and hammer toothpaste Only use your albuterol  as a rescue medication      10/29/2022  f/u ov/[place] office/Adam Wheeler re: GOLD 3  maint on trelegy 100 did not bring meds  Chief Complaint  Patient presents with   COPD  Dyspnea:  working out at [name] :  walking 10 min slow to avg pace  3 x wks Cough: gone  Sleeping: 45 degree bed no resp cc  SABA use: rarely but always after ex/ never prior or rechallenging  02: none Lung cancer screening: 10/29/2022 referred  Rec Plan A = Automatic = Always=    Trelegy 100   one click each am  Plan B = Backup (to supplement plan A, not to replace it) Only use your albuterol  inhaler as a rescue medication Plan C = Crisis (instead of Plan B but only if Plan B stops working) - only use your albuterol   nebulizer if you first try Plan B a Also  Ok to try albuterol  15 min before an activity (on alternating days)  that you know would usually make you short of breath  Please schedule a follow up visit in 6 months but call sooner if needed  with all medications /inhalers/ solutions in hand   05/24/2023  f/u ov/[place] office/Adam Wheeler re: GOLD 3 COPD  maint on Trelelgy   did not  bring meds as requested Chief Complaint  Patient presents with   Follow-up    6 month follow up   Dyspnea:  still working out at [name]  - but still can't do hills  Cough: none  Sleeping:  head of bed on one  2x4  so up 2 in s  resp cc  SABA use: not using  02: none  Lung cancer screening: neg 01/03/23 / reviewed with pt  Rec Only use your albuterol  as a rescue medication   Also  Ok to try albuterol  15 min before an activity (on alternating days)  that you know would usually make you short of breath  No change in medications     01/06/2024  f/u ov/[place] office/Adam Wheeler re: GOLD 3  COPD maint on trelegy 100  did  bring meds / back on ACEi with bad cough/wheezing Chief Complaint  Patient presents with   Medical Management of Chronic Issues   COPD    Breathing is overall doing well. He rarely uses his albuterol .  Dyspnea:  flat walking daily x 10 min  Cough:  rattle x  2 weeks daytime not first thing in am (which is atypical for cb cough)  Sleeping: 2/4 under head of bed/ 2 pillows s noct  resp cc  SABA use: rarely 02: not   Lung cancer screening: 01/07/24    No obvious day to day or daytime variability or assoc excess/ purulent sputum or mucus plugs or hemoptysis or cp or chest tightness,  overt sinus or hb symptoms.    Also denies any obvious fluctuation of symptoms with weather or environmental changes or other aggravating or alleviating factors except as outlined above   No unusual exposure hx or h/o childhood pna/ asthma or knowledge of premature birth.  Current Allergies, Complete Past Medical History, Past  Surgical History, Family History, and Social History were reviewed in [name] record.  ROS  The following are not active complaints unless bolded Hoarseness, sore throat, dysphagia, dental problems> all teeth pulled itching, sneezing,  nasal congestion or discharge of excess mucus or purulent secretions, ear ache,   fever, chills, sweats, unintended wt loss assoc with change in diet p teeth pulled or wt gain, classically pleuritic or exertional cp,  orthopnea pnd or arm/hand swelling  or leg swelling, presyncope, palpitations, abdominal pain, anorexia, nausea, vomiting, diarrhea  or change in bowel habits or change in bladder habits, change in stools or change in urine, dysuria, hematuria,  rash, arthralgias, visual complaints, headache, numbness, weakness or ataxia or problems with walking or coordination,  change in mood or  memory.        Current Meds  Medication Sig   albuterol  (PROVENTIL ) (2.5 MG/3ML) 0.083% nebulizer solution Take 3 mLs (2.5 mg total) by nebulization every 6 (six) hours as needed for wheezing or shortness of breath.   albuterol  (VENTOLIN  HFA) 108 (90 Base) MCG/ACT inhaler Up  to 2 puffs every 4 hours if needed   ARIPiprazole (ABILIFY) 10 MG tablet Take 10 mg by mouth every morning.   carbidopa-levodopa (SINEMET IR) 10-100 MG tablet Take by mouth.   FLUoxetine (PROZAC) 40 MG capsule Take 40 mg by mouth every morning.   Fluticasone-Umeclidin-Vilant (TRELEGY ELLIPTA ) 100-62.5-25 MCG/ACT AEPB TAKE 1 PUFF BY MOUTH EVERY DAY   lisinopril (ZESTRIL) 20 MG tablet Take 20 mg by mouth daily.   NEXIUM 40 MG capsule Take 40 mg by mouth daily as needed (heart burn).          Past Medical History:  Diagnosis Date   Ankle fracture, right 06/2012   Anxiety    Arthritis    Colon polyps    adenomatous polyps on multiple colonoscopies, starting in his early 19s   COPD (chronic obstructive pulmonary disease) (HCC)    Depression    History of DVT (deep vein  thrombosis)    Hypertension    Kidney stones    Seizures (HCC)    has seizures weekly   Sleep apnea    Stroke [facility]) 2005   post knee surgery in 2005         Objective:     Wts  01/06/2024       154  05/24/2023  168  10/29/2022       177  07/11/2022       177 03/05/2022      170 01/18/2022       175  11/14/2021         178 07/06/2021       174  04/27/2021       164 03/06/2021     166   10/01/19 158 lb (71.7 kg)  06/25/19 165 lb (74.8 kg)  06/03/19 163 lb 2 oz (74 kg)     Vital signs reviewed  01/06/2024  - Note at rest 02 sats  94% on RA   General appearance:    amb somber wm /  prominent  pseudowheeze    HEENT : Oropharynx  edentulous   Nasal turbinates nl    NECK :  without  apparent JVD/ palpable Nodes/TM    LUNGS: no acc muscle use,  Mild barrel  contour chest wall with bilateral  Distant   wheeze and  without cough on insp or exp maneuvers  and mild  Hyperresonant  to  percussion bilaterally     CV:  RRR  no s3 or murmur or increase in P2, and no edema   ABD:  soft and nontender   MS:  Nl gait/ ext warm without deformities Or obvious joint restrictions  calf tenderness, cyanosis or clubbing     SKIN: warm and dry without lesions    NEURO:  alert, approp, nl sensorium with  no motor or cerebellar deficits apparent.         Assessment     Assessment & Plan COPD   GOLD III/  Quit smoking 03/2021 @ 160/MM - PFT's by [facility] around 2017 /18 reported stage 3 - 10/01/2019  After extensive coaching inhaler device,  effectiveness =    90% with elipta >> continue trelegy   - PFT's  11/24/19   FEV1 1.51 (42 % ) ratio 0.35  p 1 % improvement from saba p inhaler 6 h prior to study with DLCO  12.75 (46%) corrects to 2.11 (50%)  for alv volume and FV curve classic concave exp flow vol   - 03/06/2021  After extensive coaching inhaler device,  effectiveness =    75% continue trelegy and try off acei due to pseudowheeze on exam - 04/27/2021  After extensive coaching  inhaler device,  effectiveness =    75% with smi/respimat so try stioilto 2 each am as breaking thru trelegy with lots of upper airway sympoms > preferred trelegy  - 07/06/2021   Walked on RA  x  3  lap(s) =  approx 450  ft  @ mod pace, stopped due to end of study with lowest 02 sats 94% and sob on 2nd lap   - 07/06/2021 started pred x 6 days as PLAN D > d/c 'd 10/29/2022  - 03/05/2022   Walked on RA  x  3  lap(s) =  approx 450  ft  @ fast pace, stopped due to end of study  with lowest 02 sats 94% -  03/05/2022  After extensive coaching inhaler device,  effectiveness =    75% with hfa so try breztri  in place of trelegy > preferred trelegy  - Labs ordered 07/11/2022  :  allergy screen Eos 0.2   alpha one AT phenotype MM  - 01/06/2024  After extensive coaching inhaler device,  effectiveness =    90% from baseline of 50%    Group E in  terms of symptoms/risk so  laba/lama/ICS  therefore appropriate rx at this point >>>  continue Trelegy   and approp SABA prn.   Multiple pulmonary nodules determined by computed tomography of lung - due for f/u CT  01/07/24    Essential hypertension ACE inhibitors are problematic in  pts with airway complaints because  even experienced pulmonologists can't always distinguish ace effects from copd/asthma.  By themselves they don't actually cause a problem, much like oxygen can't by itself start a fire, but they certainly serve as a powerful catalyst or enhancer for any fire  or inflammatory process in the upper airway, be it caused by a vrial URI  or  reflux.   In the era of ARB near equivalency until we have a better handle on the reversibility of the airway problem, it just makes sense to avoid ACEI  entirely in the short run and then decide later, having established a level of airway control using a reasonable limited regimen, whether to add back ace but even then being very careful to observe the pt for worsening airway control and number of meds used/ needed to control  symptoms.    >>> change to olmesartan  20 mg daily and he will f/u with pcp          Each maintenance medication was reviewed in detail including emphasizing most importantly the difference between maintenance and prns and under what circumstances the prns are to be triggered using an action plan format where appropriate.  Total time for H and P, chart review, counseling, reviewing dpi/ hfa  device(s) and generating customized AVS unique to this office visit / same day charting = 30 min         AVS  Patient Instructions  Stop lisinopril and start  olmesartan  20 mg daily   Prednisone  10 mg take  4 each am x 2 days,   2 each am x 2 days,  1 each am x 2 days and stop   Also  Ok to try albuterol  15 min before an activity (on alternating days)  that you know would usually make you short of breath and see if it makes any difference and if makes none then don't take albuterol  after activity unless you can't catch your breath as this means it's the resting that helps, not the albuterol .      Work on inhaler technique:  relax and gently blow all the way out then take a nice smooth full deep breath back in, triggering the inhaler at same time you start breathing in.  Hold breath in for at least  5 seconds if you can.    Please schedule a follow up visit in 6 months but call sooner if needed         Ozell America, MD 01/06/2024

## 2024-01-06 ENCOUNTER — Encounter: Payer: Self-pay | Admitting: Internal Medicine

## 2024-01-06 ENCOUNTER — Ambulatory Visit: Admitting: Internal Medicine

## 2024-01-06 VITALS — BP 142/84 | HR 54 | Ht 70.0 in | Wt 154.0 lb

## 2024-01-06 DIAGNOSIS — I10 Essential (primary) hypertension: Secondary | ICD-10-CM | POA: Diagnosis not present

## 2024-01-06 DIAGNOSIS — F17210 Nicotine dependence, cigarettes, uncomplicated: Secondary | ICD-10-CM

## 2024-01-06 DIAGNOSIS — J449 Chronic obstructive pulmonary disease, unspecified: Secondary | ICD-10-CM

## 2024-01-06 DIAGNOSIS — Z87891 Personal history of nicotine dependence: Secondary | ICD-10-CM | POA: Diagnosis not present

## 2024-01-06 DIAGNOSIS — R918 Other nonspecific abnormal finding of lung field: Secondary | ICD-10-CM

## 2024-01-06 MED ORDER — OLMESARTAN MEDOXOMIL 20 MG PO TABS
20.0000 mg | ORAL_TABLET | Freq: Every day | ORAL | 11 refills | Status: AC
Start: 2024-01-06 — End: ?

## 2024-01-06 MED ORDER — PREDNISONE 10 MG PO TABS: ORAL_TABLET | ORAL | 0 refills | Status: DC

## 2024-01-06 NOTE — Assessment & Plan Note (Signed)
 ACE inhibitors are problematic in  pts with airway complaints because  even experienced pulmonologists can't always distinguish ace effects from copd/asthma.  By themselves they don't actually cause a problem, much like oxygen can't by itself start a fire, but they certainly serve as a powerful catalyst or enhancer for any fire  or inflammatory process in the upper airway, be it caused by a vrial URI  or  reflux.   In the era of ARB near equivalency until we have a better handle on the reversibility of the airway problem, it just makes sense to avoid ACEI  entirely in the short run and then decide later, having established a level of airway control using a reasonable limited regimen, whether to add back ace but even then being very careful to observe the pt for worsening airway control and number of meds used/ needed to control symptoms.    >>> change to olmesartan  20 mg daily and he will f/u with pcp          Each maintenance medication was reviewed in detail including emphasizing most importantly the difference between maintenance and prns and under what circumstances the prns are to be triggered using an action plan format where appropriate.  Total time for H and P, chart review, counseling, reviewing dpi/ hfa  device(s) and generating customized AVS unique to this office visit / same day charting = 30 min

## 2024-01-06 NOTE — Patient Instructions (Addendum)
 Stop lisinopril and start  olmesartan  20 mg daily   Prednisone  10 mg take  4 each am x 2 days,   2 each am x 2 days,  1 each am x 2 days and stop   Also  Ok to try albuterol  15 min before an activity (on alternating days)  that you know would usually make you short of breath and see if it makes any difference and if makes none then don't take albuterol  after activity unless you can't catch your breath as this means it's the resting that helps, not the albuterol .      Work on inhaler technique:  relax and gently blow all the way out then take a nice smooth full deep breath back in, triggering the inhaler at same time you start breathing in.  Hold breath in for at least  5 seconds if you can.    Please schedule a follow up visit in 6 months but call sooner if needed

## 2024-01-06 NOTE — Assessment & Plan Note (Signed)
 Quit smoking 03/2021 @ 160/MM - PFT's by [facility] around 2017 /18 reported stage 3 - 10/01/2019  After extensive coaching inhaler device,  effectiveness =    90% with elipta >> continue trelegy   - PFT's  11/24/19   FEV1 1.51 (42 % ) ratio 0.35  p 1 % improvement from saba p inhaler 6 h prior to study with DLCO  12.75 (46%) corrects to 2.11 (50%)  for alv volume and FV curve classic concave exp flow vol   - 03/06/2021  After extensive coaching inhaler device,  effectiveness =    75% continue trelegy and try off acei due to pseudowheeze on exam - 04/27/2021  After extensive coaching inhaler device,  effectiveness =    75% with smi/respimat so try stioilto 2 each am as breaking thru trelegy with lots of upper airway sympoms > preferred trelegy  - 07/06/2021   Walked on RA  x  3  lap(s) =  approx 450  ft  @ mod pace, stopped due to end of study with lowest 02 sats 94% and sob on 2nd lap   - 07/06/2021 started pred x 6 days as PLAN D > d/c 'd 10/29/2022  - 03/05/2022   Walked on RA  x  3  lap(s) =  approx 450  ft  @ fast pace, stopped due to end of study  with lowest 02 sats 94% -  03/05/2022  After extensive coaching inhaler device,  effectiveness =    75% with hfa so try breztri  in place of trelegy > preferred trelegy  - Labs ordered 07/11/2022  :  allergy screen Eos 0.2   alpha one AT phenotype MM  - 01/06/2024  After extensive coaching inhaler device,  effectiveness =    90% from baseline of 50%    Group E in terms of symptoms/risk so  laba/lama/ICS  therefore appropriate rx at this point >>>  continue Trelegy   and approp SABA prn.

## 2024-01-06 NOTE — Assessment & Plan Note (Signed)
-   due for f/u CT  01/07/24

## 2024-01-07 ENCOUNTER — Ambulatory Visit (HOSPITAL_COMMUNITY)
Admission: RE | Admit: 2024-01-07 | Discharge: 2024-01-07 | Disposition: A | Discharge status: Home / Self Care | Source: Ambulatory Visit | Attending: Acute Care | Admitting: Acute Care

## 2024-01-07 DIAGNOSIS — Z122 Encounter for screening for malignant neoplasm of respiratory organs: Secondary | ICD-10-CM | POA: Diagnosis present

## 2024-01-07 DIAGNOSIS — Z87891 Personal history of nicotine dependence: Secondary | ICD-10-CM | POA: Diagnosis present

## 2024-01-15 ENCOUNTER — Other Ambulatory Visit: Payer: Self-pay

## 2024-01-15 ENCOUNTER — Telehealth: Payer: Self-pay

## 2024-01-15 DIAGNOSIS — Z87891 Personal history of nicotine dependence: Secondary | ICD-10-CM

## 2024-01-15 DIAGNOSIS — Z122 Encounter for screening for malignant neoplasm of respiratory organs: Secondary | ICD-10-CM

## 2024-01-15 NOTE — Telephone Encounter (Signed)
 Spoke with patient and reviewed recent CT results. He will complete an annual Lung CT again next year. Order placed. Reviewed Emphysema, PAH, coronary artery calcifications and aortic atherosclerosis. He is not on a statin. States he will discuss with his PCP. Discussed ILD. Pt advises he will discuss with Dr. Darlean at new follow up appt. Results and plan to PCP.   Dr. Darlean, pt request results be sent to you and advises he will discuss results with you and recommendation on ILD follow up.   Adam Wheeler

## 2024-02-05 ENCOUNTER — Other Ambulatory Visit (INDEPENDENT_AMBULATORY_CARE_PROVIDER_SITE_OTHER): Payer: Self-pay

## 2024-02-05 ENCOUNTER — Ambulatory Visit (INDEPENDENT_AMBULATORY_CARE_PROVIDER_SITE_OTHER): Admitting: Orthopedic Surgery

## 2024-02-05 ENCOUNTER — Encounter: Payer: Self-pay | Admitting: Orthopedic Surgery

## 2024-02-05 VITALS — BP 152/103 | HR 99 | Ht 70.0 in | Wt 155.0 lb

## 2024-02-05 DIAGNOSIS — G8929 Other chronic pain: Secondary | ICD-10-CM

## 2024-02-05 DIAGNOSIS — M25511 Pain in right shoulder: Secondary | ICD-10-CM

## 2024-02-05 NOTE — Progress Notes (Signed)
 New Patient Visit  Assessment: Adam Wheeler is a 68 y.o. male with the following: 1. Chronic right shoulder pain  Plan: Adam Wheeler has chronic right shoulder pain.  This is been ongoing for several years.  No specific injury.  Radiographs obtained in clinic today demonstrates a normal-appearing shoulder x-ray.  This was reviewed with the patient.  He has been getting injections in the right shoulder, and he would like to proceed with another injection today.  Per review of the notes, he has been getting pain medicine elsewhere, so I will not continue to provide this medication.  He is aware.  He will follow-up as needed.  Procedure note injection - Right shoulder    Verbal consent was obtained to inject the right shoulder, subacromial space Timeout was completed to confirm the site of injection.   The skin was prepped with alcohol and ethyl chloride was sprayed at the injection site.  A 21-gauge needle was used to inject 40 mg of Depo-Medrol  and 1% lidocaine (4 cc) into the subacromial space of the right shoulder using a posterolateral approach.  There were no complications.  A sterile bandage was applied.    Follow-up: Return if symptoms worsen or fail to improve.  Subjective:  Chief Complaint  Patient presents with   Shoulder Pain    R would like an injection     History of Present Illness: Adam Wheeler is a 68 y.o. male who presents for evaluation of right shoulder pain.  He has been dealing with shoulder pain for years.  Pain is located in the posterior aspect of the shoulder.  No specific injury.  He has been followed by Dr. Brenna.  He has been getting injections in the shoulder.  MRI in the system from 2021 demonstrated tendinopathy.  He states he has seen a provider in [place], who told him he has a torn rotator cuff.  He had a second MRI done with this physician, but this is not within our system.  At 1 point, he was receiving oxycodone  for Dr. Brenna.   However, he is no longer getting pain medicines.   Review of Systems: No fevers or chills No numbness or tingling No chest pain No shortness of breath No bowel or bladder dysfunction No GI distress No headaches   Medical History:  Past Medical History:  Diagnosis Date   Ankle fracture, right 06/2012   Anxiety    Arthritis    Colon polyps    adenomatous polyps on multiple colonoscopies, starting in his early 60s   COPD (chronic obstructive pulmonary disease) (HCC)    Depression    History of DVT (deep vein thrombosis)    Hypertension    Kidney stones    Seizures (HCC)    has seizures weekly   Sleep apnea    Stroke [facility]) 2005   post knee surgery in 2005    Past Surgical History:  Procedure Laterality Date   BALLOON DILATION N/A 04/21/2020   Procedure: BALLOON DILATION;  Surgeon: Cindie Carlin POUR, DO;  Location: [facility];  Service: Endoscopy;  Laterality: N/A;   BIOPSY N/A 07/22/2012   Procedure: BIOPSY;  Surgeon: Margo LITTIE Haddock, MD;  Location: [facility];  Service: Endoscopy;  Laterality: N/A;   BIOPSY  04/21/2020   Procedure: BIOPSY;  Surgeon: Cindie Carlin POUR, DO;  Location: [facility];  Service: Endoscopy;;  gastric GEJ   COLONOSCOPY  06/08/2004   Dr. Rehman:Small polyp cold snared from transverse colon/ Small  external hemorrhoids   COLONOSCOPY  06/16/09   DOQ:dfjoo internal hemorrhoids/simple adenomas (four polyps removed). Random colon biopsies negative for microscopic colitis. Screening colonoscopy in 5 years with propofol  recommended by Dr. harvey.   COLONOSCOPY WITH PROPOFOL  N/A 07/22/2012   Negative colonic and small bowel biopsies. Moderate sized internal hemorrhoids.    COLONOSCOPY WITH PROPOFOL  N/A 04/21/2020   Procedure: COLONOSCOPY WITH PROPOFOL ;  Surgeon: Cindie Carlin POUR, DO;  Location: [facility];  Service: Endoscopy;  Laterality: N/A;  11:00am   ESOPHAGOGASTRODUODENOSCOPY  06/16/09   SLF: distal peptic stricture, mild erythema, esophagus dilated to 16  mm. Patchy erythema in the antrum with occasional erosion with active oozing. Patchy erythema in the duodenal bulb. Small bowel biopsies benign. Minimal chronic gastritis with no H. pylori.   ESOPHAGOGASTRODUODENOSCOPY (EGD) WITH PROPOFOL  N/A 07/22/2012   gastritis. EGD with stricture at GE junction, moderate non-erosive gastritis, mild duodenitis, negative H.pylori    ESOPHAGOGASTRODUODENOSCOPY (EGD) WITH PROPOFOL  N/A 04/21/2020   Procedure: ESOPHAGOGASTRODUODENOSCOPY (EGD) WITH PROPOFOL ;  Surgeon: Cindie Carlin POUR, DO;  [facility];  Service: Endoscopy;  Laterality: N/A;   left knee surgery  2005   POLYPECTOMY  04/21/2020   Procedure: POLYPECTOMY;  Surgeon: Cindie Carlin POUR, DO;  [facility];  Service: Endoscopy;;   SAVORY DILATION N/A 07/22/2012   Procedure: SAVORY DILATION;  Surgeon: Margo LITTIE harvey, MD;  Location: [facility];  Service: Endoscopy;  Laterality: N/A;  15, 16, 17    Family History  Problem Relation Age of Onset   Colon cancer Mother        diagnosed in her 72s, deceased at 27   Breast cancer Mother    Cancer Father        deceased age 70, metastatic cancer ?stomach   Colon cancer Other        2 maternal uncles and one first cousin   Social History   Tobacco Use   Smoking status: Former    Current packs/day: 0.00    Average packs/day: 0.5 packs/day for 42.0 years (21.0 ttl pk-yrs)    Types: Cigarettes    Start date: 02/21/1979    Quit date: 02/20/2021    Years since quitting: 2.9   Smokeless tobacco: Never   Tobacco comments:       Vaping Use   Vaping status: Never Used  Substance Use Topics   Alcohol use: No   Drug use: No    Allergies  Allergen Reactions   Lortab [Hydrocodone -Acetaminophen ] Nausea And Vomiting    Current Meds  Medication Sig   albuterol  (PROVENTIL ) (2.5 MG/3ML) 0.083% nebulizer solution Take 3 mLs (2.5 mg total) by nebulization every 6 (six) hours as needed for wheezing or shortness of breath.   albuterol  (VENTOLIN  HFA)  108 (90 Base) MCG/ACT inhaler Up  to 2 puffs every 4 hours if needed   ARIPiprazole (ABILIFY) 10 MG tablet Take 10 mg by mouth every morning.   carbidopa-levodopa (SINEMET IR) 10-100 MG tablet Take by mouth.   FLUoxetine (PROZAC) 40 MG capsule Take 40 mg by mouth every morning.   Fluticasone-Umeclidin-Vilant (TRELEGY ELLIPTA ) 100-62.5-25 MCG/ACT AEPB TAKE 1 PUFF BY MOUTH EVERY DAY   lisinopril (ZESTRIL) 20 MG tablet Take 20 mg by mouth daily.   NEXIUM 40 MG capsule Take 40 mg by mouth daily as needed (heart burn).   olmesartan  (BENICAR ) 20 MG tablet Take 1 tablet (20 mg total) by mouth daily.   predniSONE  (DELTASONE ) 10 MG tablet Take  4 each am x  2 days,   2 each am x 2 days,  1 each am x 2 days and stop    Objective: BP (!) 152/103   Pulse 99   Ht 5' 10 (1.778 m)   Wt 155 lb (70.3 kg)   BMI 22.24 kg/m   Physical Exam:  General: Alert and oriented. and No acute distress. Gait: Normal gait.  Evaluation the right shoulder demonstrates no deformity.  No atrophy.  Full forward flexion.  Internal rotation to T12.  External rotation to 40 degrees.  5/5 strength in the supraspinatus and infraspinatus.  Negative belly press.  Fingers warm and well-perfused.  IMAGING: I personally ordered and reviewed the following images  X-rays of the right shoulder were obtained in clinic today.  No acute injuries are noted.  No evidence of proximal humeral migration.  Well-maintained glenohumeral joint space.  There is some loss of joint space at the [facility] joint.  No bony lesions.  Impression: Negative right shoulder   New Medications:  No orders of the defined types were placed in this encounter.     Adam DELENA Horde, MD  02/05/2024 11:34 AM

## 2024-02-05 NOTE — Patient Instructions (Signed)

## 2024-04-20 ENCOUNTER — Ambulatory Visit: Payer: Self-pay | Admitting: Internal Medicine

## 2024-04-20 NOTE — Telephone Encounter (Signed)
 FYI Only or Action Required?: Action required by provider: pt is requesting medication for SOB, & cough.  Patient is followed in Pulmonology for COPD, last seen on 01/06/2024 by Darlean Ozell NOVAK, MD.  Called Nurse Triage reporting Shortness of Breath with exertion.  Symptoms began a week ago.  Interventions attempted: Nothing.  Symptoms are: gradually worsening.  Triage Disposition: See HCP Within 4 Hours (Or PCP Triage)  Patient/caregiver understands and will follow disposition?: Yes                    E2C2 Pulmonary Triage - Initial Assessment Questions Chief Complaint (e.g., cough, sob, wheezing, fever, chills, sweat or additional symptoms) *Go to specific symptom protocol after initial questions. sob  How long have symptoms been present?  1 week  Have you tested for COVID or Flu? Note: If not, ask patient if a home test can be taken. If so, instruct patient to call back for positive results. No  MEDICINES:   Have you used any OTC meds to help with symptoms? No If yes, ask What medications? Cold and sinus  Have you used your inhalers/maintenance medication? Yes If yes, What medications? Not very poftern  If inhaler, ask How many puffs and how often? Note: Review instructions on medication in the chart. Used it last night  OXYGEN: Do you wear supplemental oxygen? No If yes, How many liters are you supposed to use? na  Do you monitor your oxygen levels? No If yes, What is your reading (oxygen level) today? na  What is your usual oxygen saturation reading?  (Note: Pulmonary O2 sats should be 90% or greater) Doe not monitor               Copied from CRM #[id number]. Topic: Clinical - Red Word Triage >> Apr 20, 2024  2:45 PM Benton KIDD wrote: [facility] that prompted transfer to Nurse Triage: not breathing good . Having shortness of breath  Dr wert in [place] Reason for Disposition  [1] MILD difficulty breathing  (e.g., minimal/no SOB at rest, SOB with walking, pulse < 100) AND [2] NEW-onset or WORSE than normal  Answer Assessment - Initial Assessment Questions 1. RESPIRATORY STATUS: Describe your breathing? (e.g., wheezing, shortness of breath, unable to speak, severe coughing)      SOB  2. ONSET: When did this breathing problem begin?      1 week ago 3. PATTERN Does the difficult breathing come and go, or has it been constant since it started?      Comes and goes 4. SEVERITY: How bad is your breathing? (e.g., mild, moderate, severe)      Mild - With exertion gets out of breath 5. RECURRENT SYMPTOM: Have you had difficulty breathing before? If Yes, ask: When was the last time? and What happened that time?      yes 6. CARDIAC HISTORY: Do you have any history of heart disease? (e.g., heart attack, angina, bypass surgery, angioplasty)      no 7. LUNG HISTORY: Do you have any history of lung disease?  (e.g., pulmonary embolus, asthma, emphysema)     COPD 8. CAUSE: What do you think is causing the breathing problem?      URI + COPD 9. OTHER SYMPTOMS: Do you have any other symptoms? (e.g., chest pain, cough, dizziness, fever, runny nose)     Dizziness, cough, runny nose  Protocols used: Breathing Difficulty-A-AH

## 2024-04-21 ENCOUNTER — Ambulatory Visit (INDEPENDENT_AMBULATORY_CARE_PROVIDER_SITE_OTHER): Admitting: Pulmonary Disease

## 2024-04-21 ENCOUNTER — Ambulatory Visit: Payer: Self-pay | Admitting: Internal Medicine

## 2024-04-21 ENCOUNTER — Encounter: Payer: Self-pay | Admitting: Pulmonary Disease

## 2024-04-21 VITALS — BP 119/81 | HR 85 | Ht 70.0 in | Wt 158.0 lb

## 2024-04-21 DIAGNOSIS — J439 Emphysema, unspecified: Secondary | ICD-10-CM | POA: Diagnosis not present

## 2024-04-21 DIAGNOSIS — J208 Acute bronchitis due to other specified organisms: Secondary | ICD-10-CM

## 2024-04-21 DIAGNOSIS — J441 Chronic obstructive pulmonary disease with (acute) exacerbation: Secondary | ICD-10-CM | POA: Diagnosis not present

## 2024-04-21 MED ORDER — ALBUTEROL SULFATE (2.5 MG/3ML) 0.083% IN NEBU: 2.5000 mg | INHALATION_SOLUTION | Freq: Four times a day (QID) | RESPIRATORY_TRACT | 12 refills | Status: AC | PRN

## 2024-04-21 MED ORDER — AZITHROMYCIN 250 MG PO TABS: ORAL_TABLET | ORAL | 0 refills | Status: AC

## 2024-04-21 MED ORDER — PREDNISONE 20 MG PO TABS: 40.0000 mg | ORAL_TABLET | Freq: Every day | ORAL | 0 refills | Status: AC

## 2024-04-21 NOTE — Patient Instructions (Signed)
" °  VISIT SUMMARY: You came in today with respiratory symptoms after attending a large event. You have been experiencing chills, body aches, sore throat, and a painful cough for the past four days. You also have shortness of breath, wheezing, and chest tightness.  YOUR PLAN: COPD WITH ACUTE EXACERBATION: You are having a flare-up of your COPD with increased shortness of breath, wheezing, and chest tightness. -Continue using Trelegy once daily. -Use your nebulizer three times a day with the prescribed solution. -Your albuterol  inhaler has been refilled. -New nebulizer tubing has been ordered for you. -Monitor your oxygen levels. If they drop below 88%, go to the emergency room.  SUSPECTED ACUTE VIRAL RESPIRATORY INFECTION: You likely have a viral infection causing chills, body aches, sore throat, and a productive cough. We are waiting for your flu and COVID test results. -Flu and COVID tests have been ordered. -If your flu test is positive, you will be prescribed Tamiflu. Contact your primary care doctor for this. -You have been prescribed a Z-Pak and prednisone . Take two tablets a day for five days. -Monitor your symptoms and seek emergency care if they worsen or if your oxygen levels drop below 88%.  Contains text generated by [employer].  "

## 2024-04-21 NOTE — Telephone Encounter (Signed)
 FYI Only or Action Required?: FYI only for provider: appointment scheduled on 04/21/24.  Patient is followed in Pulmonology for COPD, last seen on 01/06/2024 by Darlean Ozell NOVAK, MD.  Called Nurse Triage reporting Shortness of Breath and Fever.  Symptoms began several days ago.  Interventions attempted: OTC medications: tylenol ; cold medication.  Symptoms are: unchanged.  Triage Disposition: See Physician Within 24 Hours  Patient/caregiver understands and will follow disposition?: Yes    Copied from CRM #[id number]. Topic: Clinical - Red Word Triage >> Apr 21, 2024  9:48 AM LaVerne A wrote: Red Word that prompted transfer to Nurse Triage: Patient is having shortness of breath, slight fever, body aches and he said his lungs hurt.   Reason for Disposition  Fever present > 3 days (72 hours)  Answer Assessment - Initial Assessment Questions Pt called into clinic requesting appt for SOB, painful cough, body aches and chills with suspected fever x 2 days. Pt states he started to notice symptoms 3 days ago but has suspected fever x 2 days. Pt reports taking tylenol  and cold medication otc without improvement. Pt has not used inhalers or any oxygen therapy. Pt requested appt today with any provider in clinic. No appts today d/t Dr. Darlean being in [place]. Contacted [facility] and spoke to [place] who scheduled pt for today with alternative provider.     1. TEMPERATURE: What is the most recent temperature?  How was it measured?      Unknown pt states that he feels hot and reports chills; has not checked temperature   2. ONSET: When did the fever start?      2 days ago   3. CHILLS: Do you have chills? If yes: How bad are they?  (e.g., none, mild, moderate, severe)     Yes  4. OTHER SYMPTOMS: Do you have any other symptoms besides the fever?  (e.g., abdomen pain, cough, diarrhea, earache, headache, sore throat, urination pain)     Body aches, cough, SOB   5. CAUSE: If there are no  symptoms, ask: What do you think is causing the fever?      Unsure  Protocols used: [facility]

## 2024-04-21 NOTE — Progress Notes (Signed)
 "  Established Patient Pulmonology Office Visit   Subjective:  Patient ID: Adam Wheeler, male    DOB: 1955-05-14  MRN: [id number]  CC:  Chief Complaint  Patient presents with   Acute Visit    Sob / thinks has the flu  / can't breath / coughing yellow     HPI  Adam Wheeler is a 69 y/o M with hx of Grade III COPD, CPFE, and GERD who presents for an acute visit.  Discussed the use of AI scribe software for clinical note transcription with the patient, who gave verbal consent to proceed.  History of Present Illness Adam Wheeler is a 69 year old male who presents with respiratory symptoms following a recent exposure at a large event.  He attended a [employer] game with approximately sixty-five thousand people last Sunday, after which he began experiencing symptoms. He has chills, body aches, a sore throat, and painful coughing. These symptoms started about four days ago and have remained consistent without improvement.  He experiences shortness of breath and is coughing up yellow phlegm. He also has mild wheezing and chest tightness. He uses a dry powder inhaler, Trelegy, once a day and has a nebulizer machine at home. He also uses an albuterol  inhaler as needed.  He has been using Advil  Cold & Sinus to manage his fever, which provides temporary relief. During the review of symptoms, he confirmed having a runny nose and denied any known allergies to antibiotics. He mentioned that his nose occasionally turns black and blue, which is concerning to him. He also noted that he becomes out of breath with minimal exertion.  ROS   Current Medications[1]      Objective:  BP 119/81   Pulse 85   Ht 5' 10 (1.778 m)   Wt 158 lb (71.7 kg)   SpO2 91% Comment: ra  BMI 22.67 kg/m  Wt Readings from Last 3 Encounters:  04/21/24 158 lb (71.7 kg)  02/05/24 155 lb (70.3 kg)  01/06/24 154 lb (69.9 kg)   BMI Readings from Last 3 Encounters:  04/21/24 22.67 kg/m  02/05/24 22.24 kg/m   01/06/24 22.10 kg/m   SpO2 Readings from Last 3 Encounters:  04/21/24 91%  01/06/24 94%  05/24/23 90%    Physical Exam General: NAD, alert, WD, WN Eyes: PERRL, no scleral icterus ENMT: oropharynx clear, good dentition, erythematous and edematous tonsillar pillars, mallampati score I  Skin: warm, intact, no rashes Neck: JVD flat, ROM and lymph node assessment normal CV: RRR, no MRG, nl S1 and S2, no peripheral edema Resp: expiratory wheezing diffusely, + clubbing Skin: psoriasis rash in hands bilaterally Neuro: Awake alert oriented to person place time and situation   Diagnostic Review:  Last CBC Lab Results  Component Value Date   WBC 7.0 07/11/2022   HGB 13.6 07/11/2022   HCT 41.7 07/11/2022   MCV 93 07/11/2022   MCH 30.3 07/11/2022   RDW 13.8 07/11/2022   PLT 279 07/11/2022   Last metabolic panel Lab Results  Component Value Date   GLUCOSE 104 (H) 04/25/2021   NA 139 04/25/2021   K 3.2 (L) 04/25/2021   CL 104 04/25/2021   CO2 25 04/25/2021   BUN 16 04/25/2021   CREATININE 0.83 04/25/2021   GFRNONAA >60 04/25/2021   CALCIUM 8.8 (L) 04/25/2021   PROT 7.6 04/25/2021   ALBUMIN 3.6 04/25/2021   BILITOT 0.2 (L) 04/25/2021   ALKPHOS 79 04/25/2021   AST 20 04/25/2021   ALT 20 04/25/2021  ANIONGAP 10 04/25/2021    LDCT 12/2023: IMPRESSION: 1. Lung-RADS 2, benign appearance or behavior. Continue annual screening with low-dose chest CT without contrast in 12 months. 2. 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 3. Aortic atherosclerosis (ICD10-I70.0). Coronary artery calcification. 4. Enlarged pulmonic trunk, indicative of pulmonary arterial hypertension. 5.  Emphysema (ICD10-J43.9).    Assessment & Plan:   Assessment & Plan COPD with acute exacerbation (HCC) COPD with acute exacerbation Acute exacerbation with increased dyspnea, wheezing, and chest tightness. No smoking history. Using Trelegy and nebulizer. - Continue  Trelegy once daily. - Use nebulizer three times a day with prescribed solution. - Refilled albuterol  inhaler. - Submitted order for new nebulizer tubing. - Monitor oxygen saturation; if less than 88%, go to the emergency department. Chronic obstructive pulmonary disease with emphysema, unspecified emphysema type (HCC) Grade III, Class B COPD. Will continue ICS/LAMA/LABA with Trelegy. Viral bronchitis Suspected acute viral respiratory infection (influenza vs. other) Suspected viral infection with chills, myalgia, sore throat, and productive cough. Differential includes influenza and other viruses. Awaiting flu and COVID test results. - Ordered flu, RSV, and COVID test - Rapid flu test is negative. - Prescribed Z-Pak and prednisone  (two tablets a day for five days). - Advised to contact primary care doctor for Tamiflu if flu test is positive. - Monitor symptoms and seek emergency care if symptoms worsen or oxygen saturation drops below 88%.  Orders Placed This Encounter  Procedures   COVID-19, Flu A+B and RSV   Veritor Flu A/B Waived   I spent 20 minutes reviewing patient's chart including prior consultant notes, imaging, and PFTs as well as face-to-face with the patient, over half in discussion of the diagnosis and the importance of compliance with the treatment plan.  Return in about 3 months (around 07/20/2024).   [name], MD     [1]  Current Outpatient Medications:    ARIPiprazole (ABILIFY) 10 MG tablet, Take 10 mg by mouth every morning., Disp: , Rfl:    azithromycin  (ZITHROMAX ) 250 MG tablet, 2 tabs today, then 1 tab daily on days 2-5, Disp: 6 tablet, Rfl: 0   carbidopa-levodopa (SINEMET IR) 10-100 MG tablet, Take by mouth., Disp: , Rfl:    FLUoxetine (PROZAC) 40 MG capsule, Take 40 mg by mouth every morning., Disp: , Rfl:    Fluticasone-Umeclidin-Vilant (TRELEGY ELLIPTA ) 100-62.5-25 MCG/ACT AEPB, TAKE 1 PUFF BY MOUTH EVERY DAY, Disp: 60 each, Rfl: 6   lisinopril  (ZESTRIL) 20 MG tablet, Take 20 mg by mouth daily., Disp: , Rfl:    NEXIUM 40 MG capsule, Take 40 mg by mouth daily as needed (heart burn)., Disp: , Rfl:    olmesartan  (BENICAR ) 20 MG tablet, Take 1 tablet (20 mg total) by mouth daily., Disp: 30 tablet, Rfl: 11   predniSONE  (DELTASONE ) 20 MG tablet, Take 2 tablets (40 mg total) by mouth daily with breakfast for 5 days., Disp: 10 tablet, Rfl: 0   albuterol  (PROVENTIL ) (2.5 MG/3ML) 0.083% nebulizer solution, Take 3 mLs (2.5 mg total) by nebulization every 6 (six) hours as needed for wheezing or shortness of breath., Disp: 75 mL, Rfl: 12  "

## 2024-04-21 NOTE — Telephone Encounter (Signed)
 Pt needs acute appt be sure pt aware Darlean is in [place]   Patient is having shortness of breath, slight fever, body aches and he said his lungs hurt.

## 2024-04-24 ENCOUNTER — Ambulatory Visit: Payer: Self-pay | Admitting: Pulmonary Disease

## 2024-04-24 LAB — COVID-19, FLU A+B AND RSV
Influenza A, NAA: DETECTED — AB
Influenza B, NAA: NOT DETECTED
RSV, NAA: NOT DETECTED
SARS-CoV-2, NAA: NOT DETECTED

## 2024-04-24 NOTE — Telephone Encounter (Signed)
 Pt seen in clinic

## 2024-04-27 LAB — VERITOR FLU A/B WAIVED
Influenza A: NEGATIVE
Influenza B: NEGATIVE

## 2024-07-20 ENCOUNTER — Ambulatory Visit: Admitting: Pulmonary Disease
